# Patient Record
Sex: Female | Born: 1940 | Race: White | Hispanic: No | Marital: Married | Smoking: Never smoker
Health system: Southern US, Community
[De-identification: ages and names within clinical notes are randomized; demographics above are authoritative.]

## PROBLEM LIST (undated history)

## (undated) DIAGNOSIS — M1990 Unspecified osteoarthritis, unspecified site: Secondary | ICD-10-CM

## (undated) DIAGNOSIS — K219 Gastro-esophageal reflux disease without esophagitis: Secondary | ICD-10-CM

## (undated) DIAGNOSIS — R002 Palpitations: Secondary | ICD-10-CM

## (undated) DIAGNOSIS — I639 Cerebral infarction, unspecified: Secondary | ICD-10-CM

## (undated) DIAGNOSIS — I700 Atherosclerosis of aorta: Secondary | ICD-10-CM

## (undated) DIAGNOSIS — I209 Angina pectoris, unspecified: Secondary | ICD-10-CM

## (undated) DIAGNOSIS — G459 Transient cerebral ischemic attack, unspecified: Secondary | ICD-10-CM

## (undated) DIAGNOSIS — E785 Hyperlipidemia, unspecified: Secondary | ICD-10-CM

## (undated) DIAGNOSIS — K5792 Diverticulitis of intestine, part unspecified, without perforation or abscess without bleeding: Secondary | ICD-10-CM

## (undated) DIAGNOSIS — K589 Irritable bowel syndrome without diarrhea: Secondary | ICD-10-CM

## (undated) DIAGNOSIS — B029 Zoster without complications: Secondary | ICD-10-CM

## (undated) DIAGNOSIS — I10 Essential (primary) hypertension: Secondary | ICD-10-CM

## (undated) DIAGNOSIS — N952 Postmenopausal atrophic vaginitis: Secondary | ICD-10-CM

## (undated) DIAGNOSIS — I2510 Atherosclerotic heart disease of native coronary artery without angina pectoris: Secondary | ICD-10-CM

## (undated) DIAGNOSIS — M8580 Other specified disorders of bone density and structure, unspecified site: Secondary | ICD-10-CM

## (undated) DIAGNOSIS — M1612 Unilateral primary osteoarthritis, left hip: Secondary | ICD-10-CM

## (undated) HISTORY — PX: CARDIAC SURGERY: SHX584

## (undated) HISTORY — PX: ABDOMINAL HYSTERECTOMY: SHX81

## (undated) HISTORY — PX: APPENDECTOMY: SHX54

## (undated) HISTORY — PX: BREAST BIOPSY: SHX20

## (undated) HISTORY — PX: TONSILLECTOMY: SUR1361

---

## 2007-07-19 ENCOUNTER — Ambulatory Visit: Payer: Self-pay | Admitting: Ophthalmology

## 2007-10-10 ENCOUNTER — Ambulatory Visit: Payer: Self-pay | Admitting: Family Medicine

## 2007-12-18 ENCOUNTER — Ambulatory Visit: Payer: Self-pay | Admitting: Internal Medicine

## 2008-05-20 ENCOUNTER — Ambulatory Visit: Payer: Self-pay | Admitting: Family Medicine

## 2008-09-23 ENCOUNTER — Ambulatory Visit: Payer: Self-pay | Admitting: Family Medicine

## 2009-05-22 ENCOUNTER — Ambulatory Visit: Payer: Self-pay | Admitting: Internal Medicine

## 2009-11-23 ENCOUNTER — Ambulatory Visit: Payer: Self-pay | Admitting: Internal Medicine

## 2009-12-27 DIAGNOSIS — Z955 Presence of coronary angioplasty implant and graft: Secondary | ICD-10-CM | POA: Insufficient documentation

## 2010-01-05 ENCOUNTER — Inpatient Hospital Stay: Payer: Self-pay | Admitting: Cardiology

## 2010-02-05 ENCOUNTER — Encounter: Payer: Self-pay | Admitting: Cardiology

## 2010-02-24 ENCOUNTER — Encounter: Payer: Self-pay | Admitting: Cardiology

## 2010-03-27 ENCOUNTER — Encounter: Payer: Self-pay | Admitting: Cardiology

## 2010-06-08 ENCOUNTER — Ambulatory Visit: Payer: Self-pay | Admitting: Internal Medicine

## 2011-01-21 ENCOUNTER — Ambulatory Visit: Payer: Self-pay | Admitting: Internal Medicine

## 2011-07-14 ENCOUNTER — Ambulatory Visit: Payer: Self-pay | Admitting: Internal Medicine

## 2012-07-14 ENCOUNTER — Ambulatory Visit: Payer: Self-pay | Admitting: Internal Medicine

## 2012-09-23 ENCOUNTER — Ambulatory Visit: Payer: Self-pay | Admitting: Internal Medicine

## 2012-09-23 LAB — URINALYSIS, COMPLETE
Bilirubin,UR: NEGATIVE
Glucose,UR: NEGATIVE mg/dL (ref 0–75)
Nitrite: NEGATIVE
Specific Gravity: 1.01 (ref 1.003–1.030)

## 2012-09-25 LAB — URINE CULTURE

## 2013-07-25 ENCOUNTER — Ambulatory Visit: Payer: Self-pay

## 2013-10-27 ENCOUNTER — Emergency Department: Payer: Self-pay | Admitting: Emergency Medicine

## 2013-10-27 LAB — URINALYSIS, COMPLETE
Bacteria: NONE SEEN
Bilirubin,UR: NEGATIVE
Blood: NEGATIVE
Glucose,UR: NEGATIVE mg/dL (ref 0–75)
Ketone: NEGATIVE
Leukocyte Esterase: NEGATIVE
Ph: 7 (ref 4.5–8.0)

## 2013-10-27 LAB — COMPREHENSIVE METABOLIC PANEL
Albumin: 3.8 g/dL (ref 3.4–5.0)
Alkaline Phosphatase: 79 U/L (ref 50–136)
Anion Gap: 7 (ref 7–16)
BUN: 15 mg/dL (ref 7–18)
Calcium, Total: 9.4 mg/dL (ref 8.5–10.1)
Co2: 26 mmol/L (ref 21–32)
EGFR (African American): >60
Glucose: 102 mg/dL — ABNORMAL HIGH (ref 65–99)
Potassium: 4.1 mmol/L (ref 3.5–5.1)
SGPT (ALT): 26 U/L (ref 12–78)
Sodium: 141 mmol/L (ref 136–145)
Total Protein: 7.1 g/dL (ref 6.4–8.2)

## 2013-10-27 LAB — CK TOTAL AND CKMB (NOT AT ARMC)
CK, Total: 127 U/L (ref 21–215)
CK-MB: 1.6 ng/mL (ref 0.5–3.6)

## 2013-10-27 LAB — CBC
HGB: 14.9 g/dL (ref 12.0–16.0)
MCH: 31.9 pg (ref 26.0–34.0)
MCHC: 33.4 g/dL (ref 32.0–36.0)
Platelet: 213 10*3/uL (ref 150–440)
RBC: 4.66 10*6/uL (ref 3.80–5.20)

## 2013-10-27 LAB — LIPASE, BLOOD: Lipase: 133 U/L (ref 73–393)

## 2014-01-10 DIAGNOSIS — E7800 Pure hypercholesterolemia, unspecified: Secondary | ICD-10-CM | POA: Diagnosis not present

## 2014-01-10 DIAGNOSIS — Z0000 Encounter for general adult medical examination without abnormal findings: Secondary | ICD-10-CM | POA: Diagnosis not present

## 2014-01-10 DIAGNOSIS — Z79899 Other long term (current) drug therapy: Secondary | ICD-10-CM | POA: Diagnosis not present

## 2014-01-17 DIAGNOSIS — I2510 Atherosclerotic heart disease of native coronary artery without angina pectoris: Secondary | ICD-10-CM | POA: Diagnosis not present

## 2014-01-17 DIAGNOSIS — E039 Hypothyroidism, unspecified: Secondary | ICD-10-CM | POA: Diagnosis not present

## 2014-01-17 DIAGNOSIS — E785 Hyperlipidemia, unspecified: Secondary | ICD-10-CM | POA: Diagnosis not present

## 2014-01-17 DIAGNOSIS — M549 Dorsalgia, unspecified: Secondary | ICD-10-CM | POA: Diagnosis not present

## 2014-01-17 DIAGNOSIS — M47817 Spondylosis without myelopathy or radiculopathy, lumbosacral region: Secondary | ICD-10-CM | POA: Diagnosis not present

## 2014-01-24 DIAGNOSIS — K219 Gastro-esophageal reflux disease without esophagitis: Secondary | ICD-10-CM | POA: Diagnosis not present

## 2014-01-24 DIAGNOSIS — Z1211 Encounter for screening for malignant neoplasm of colon: Secondary | ICD-10-CM | POA: Diagnosis not present

## 2014-01-24 DIAGNOSIS — Z8371 Family history of colonic polyps: Secondary | ICD-10-CM | POA: Diagnosis not present

## 2014-01-28 DIAGNOSIS — E8941 Symptomatic postprocedural ovarian failure: Secondary | ICD-10-CM | POA: Diagnosis not present

## 2014-01-31 DIAGNOSIS — R293 Abnormal posture: Secondary | ICD-10-CM | POA: Diagnosis not present

## 2014-01-31 DIAGNOSIS — M545 Low back pain, unspecified: Secondary | ICD-10-CM | POA: Diagnosis not present

## 2014-01-31 DIAGNOSIS — IMO0001 Reserved for inherently not codable concepts without codable children: Secondary | ICD-10-CM | POA: Diagnosis not present

## 2014-02-22 DIAGNOSIS — J323 Chronic sphenoidal sinusitis: Secondary | ICD-10-CM | POA: Diagnosis not present

## 2014-02-24 DIAGNOSIS — M545 Low back pain, unspecified: Secondary | ICD-10-CM | POA: Diagnosis not present

## 2014-02-24 DIAGNOSIS — R293 Abnormal posture: Secondary | ICD-10-CM | POA: Diagnosis not present

## 2014-02-24 DIAGNOSIS — IMO0001 Reserved for inherently not codable concepts without codable children: Secondary | ICD-10-CM | POA: Diagnosis not present

## 2014-03-12 DIAGNOSIS — I2510 Atherosclerotic heart disease of native coronary artery without angina pectoris: Secondary | ICD-10-CM | POA: Diagnosis not present

## 2014-03-12 DIAGNOSIS — R002 Palpitations: Secondary | ICD-10-CM | POA: Diagnosis not present

## 2014-03-12 DIAGNOSIS — E782 Mixed hyperlipidemia: Secondary | ICD-10-CM | POA: Diagnosis not present

## 2014-03-12 DIAGNOSIS — I10 Essential (primary) hypertension: Secondary | ICD-10-CM | POA: Diagnosis not present

## 2014-03-28 ENCOUNTER — Ambulatory Visit: Payer: Self-pay | Admitting: Unknown Physician Specialty

## 2014-03-28 DIAGNOSIS — K449 Diaphragmatic hernia without obstruction or gangrene: Secondary | ICD-10-CM | POA: Diagnosis not present

## 2014-03-28 DIAGNOSIS — Z538 Procedure and treatment not carried out for other reasons: Secondary | ICD-10-CM | POA: Diagnosis not present

## 2014-03-28 DIAGNOSIS — E7800 Pure hypercholesterolemia, unspecified: Secondary | ICD-10-CM | POA: Diagnosis not present

## 2014-03-28 DIAGNOSIS — Z1211 Encounter for screening for malignant neoplasm of colon: Secondary | ICD-10-CM | POA: Diagnosis not present

## 2014-03-28 DIAGNOSIS — J329 Chronic sinusitis, unspecified: Secondary | ICD-10-CM | POA: Diagnosis not present

## 2014-03-28 DIAGNOSIS — Z888 Allergy status to other drugs, medicaments and biological substances status: Secondary | ICD-10-CM | POA: Diagnosis not present

## 2014-03-28 DIAGNOSIS — Z79899 Other long term (current) drug therapy: Secondary | ICD-10-CM | POA: Diagnosis not present

## 2014-03-28 DIAGNOSIS — I10 Essential (primary) hypertension: Secondary | ICD-10-CM | POA: Diagnosis not present

## 2014-03-28 DIAGNOSIS — K5730 Diverticulosis of large intestine without perforation or abscess without bleeding: Secondary | ICD-10-CM | POA: Diagnosis not present

## 2014-03-28 DIAGNOSIS — M899 Disorder of bone, unspecified: Secondary | ICD-10-CM | POA: Diagnosis not present

## 2014-03-28 DIAGNOSIS — Z8489 Family history of other specified conditions: Secondary | ICD-10-CM | POA: Diagnosis not present

## 2014-03-28 DIAGNOSIS — Z7982 Long term (current) use of aspirin: Secondary | ICD-10-CM | POA: Diagnosis not present

## 2014-03-28 DIAGNOSIS — Z9861 Coronary angioplasty status: Secondary | ICD-10-CM | POA: Diagnosis not present

## 2014-03-28 DIAGNOSIS — R002 Palpitations: Secondary | ICD-10-CM | POA: Diagnosis not present

## 2014-03-28 DIAGNOSIS — Z823 Family history of stroke: Secondary | ICD-10-CM | POA: Diagnosis not present

## 2014-03-28 DIAGNOSIS — Z5309 Procedure and treatment not carried out because of other contraindication: Secondary | ICD-10-CM | POA: Diagnosis not present

## 2014-03-28 DIAGNOSIS — I2510 Atherosclerotic heart disease of native coronary artery without angina pectoris: Secondary | ICD-10-CM | POA: Diagnosis not present

## 2014-03-28 DIAGNOSIS — R12 Heartburn: Secondary | ICD-10-CM | POA: Diagnosis not present

## 2014-03-28 DIAGNOSIS — Z8371 Family history of colonic polyps: Secondary | ICD-10-CM | POA: Diagnosis not present

## 2014-03-28 DIAGNOSIS — IMO0002 Reserved for concepts with insufficient information to code with codable children: Secondary | ICD-10-CM | POA: Diagnosis not present

## 2014-03-28 DIAGNOSIS — M949 Disorder of cartilage, unspecified: Secondary | ICD-10-CM | POA: Diagnosis not present

## 2014-03-28 DIAGNOSIS — N952 Postmenopausal atrophic vaginitis: Secondary | ICD-10-CM | POA: Diagnosis not present

## 2014-03-28 DIAGNOSIS — Z8249 Family history of ischemic heart disease and other diseases of the circulatory system: Secondary | ICD-10-CM | POA: Diagnosis not present

## 2014-03-28 DIAGNOSIS — E785 Hyperlipidemia, unspecified: Secondary | ICD-10-CM | POA: Diagnosis not present

## 2014-03-28 DIAGNOSIS — K648 Other hemorrhoids: Secondary | ICD-10-CM | POA: Diagnosis not present

## 2014-04-09 DIAGNOSIS — R5383 Other fatigue: Secondary | ICD-10-CM | POA: Diagnosis not present

## 2014-04-09 DIAGNOSIS — Z79899 Other long term (current) drug therapy: Secondary | ICD-10-CM | POA: Diagnosis not present

## 2014-04-09 DIAGNOSIS — R5381 Other malaise: Secondary | ICD-10-CM | POA: Diagnosis not present

## 2014-04-09 DIAGNOSIS — R4789 Other speech disturbances: Secondary | ICD-10-CM | POA: Diagnosis not present

## 2014-04-09 DIAGNOSIS — I10 Essential (primary) hypertension: Secondary | ICD-10-CM | POA: Diagnosis not present

## 2014-04-09 DIAGNOSIS — I2510 Atherosclerotic heart disease of native coronary artery without angina pectoris: Secondary | ICD-10-CM | POA: Diagnosis not present

## 2014-04-09 DIAGNOSIS — R55 Syncope and collapse: Secondary | ICD-10-CM | POA: Diagnosis not present

## 2014-04-09 DIAGNOSIS — R42 Dizziness and giddiness: Secondary | ICD-10-CM | POA: Diagnosis not present

## 2014-04-09 DIAGNOSIS — E785 Hyperlipidemia, unspecified: Secondary | ICD-10-CM | POA: Diagnosis not present

## 2014-04-09 DIAGNOSIS — Z7982 Long term (current) use of aspirin: Secondary | ICD-10-CM | POA: Diagnosis not present

## 2014-04-16 DIAGNOSIS — I10 Essential (primary) hypertension: Secondary | ICD-10-CM | POA: Diagnosis not present

## 2014-04-16 DIAGNOSIS — R358 Other polyuria: Secondary | ICD-10-CM | POA: Diagnosis not present

## 2014-04-16 DIAGNOSIS — G459 Transient cerebral ischemic attack, unspecified: Secondary | ICD-10-CM | POA: Diagnosis not present

## 2014-04-16 DIAGNOSIS — R42 Dizziness and giddiness: Secondary | ICD-10-CM | POA: Diagnosis not present

## 2014-04-16 DIAGNOSIS — R3589 Other polyuria: Secondary | ICD-10-CM | POA: Diagnosis not present

## 2014-04-16 DIAGNOSIS — E7800 Pure hypercholesterolemia, unspecified: Secondary | ICD-10-CM | POA: Diagnosis not present

## 2014-04-18 DIAGNOSIS — G459 Transient cerebral ischemic attack, unspecified: Secondary | ICD-10-CM | POA: Diagnosis not present

## 2014-04-20 DIAGNOSIS — R4789 Other speech disturbances: Secondary | ICD-10-CM | POA: Diagnosis not present

## 2014-04-20 DIAGNOSIS — I10 Essential (primary) hypertension: Secondary | ICD-10-CM | POA: Diagnosis not present

## 2014-04-20 DIAGNOSIS — I2510 Atherosclerotic heart disease of native coronary artery without angina pectoris: Secondary | ICD-10-CM | POA: Diagnosis not present

## 2014-04-20 DIAGNOSIS — I63541 Cerebral infarction due to unspecified occlusion or stenosis of right cerebellar artery: Secondary | ICD-10-CM

## 2014-04-20 DIAGNOSIS — I6350 Cerebral infarction due to unspecified occlusion or stenosis of unspecified cerebral artery: Secondary | ICD-10-CM | POA: Diagnosis not present

## 2014-04-20 DIAGNOSIS — E785 Hyperlipidemia, unspecified: Secondary | ICD-10-CM | POA: Diagnosis not present

## 2014-04-20 DIAGNOSIS — R9431 Abnormal electrocardiogram [ECG] [EKG]: Secondary | ICD-10-CM | POA: Diagnosis not present

## 2014-04-20 DIAGNOSIS — G459 Transient cerebral ischemic attack, unspecified: Secondary | ICD-10-CM | POA: Diagnosis not present

## 2014-04-20 DIAGNOSIS — Z9861 Coronary angioplasty status: Secondary | ICD-10-CM | POA: Diagnosis not present

## 2014-04-20 DIAGNOSIS — Z7982 Long term (current) use of aspirin: Secondary | ICD-10-CM | POA: Diagnosis not present

## 2014-04-20 HISTORY — DX: Cerebral infarction due to unspecified occlusion or stenosis of right cerebellar artery: I63.541

## 2014-04-23 ENCOUNTER — Emergency Department: Payer: Self-pay | Admitting: Emergency Medicine

## 2014-04-23 DIAGNOSIS — R42 Dizziness and giddiness: Secondary | ICD-10-CM | POA: Diagnosis not present

## 2014-04-23 DIAGNOSIS — Z7902 Long term (current) use of antithrombotics/antiplatelets: Secondary | ICD-10-CM | POA: Diagnosis not present

## 2014-04-23 DIAGNOSIS — Z9079 Acquired absence of other genital organ(s): Secondary | ICD-10-CM | POA: Diagnosis not present

## 2014-04-23 DIAGNOSIS — R5381 Other malaise: Secondary | ICD-10-CM | POA: Diagnosis not present

## 2014-04-23 DIAGNOSIS — Z8673 Personal history of transient ischemic attack (TIA), and cerebral infarction without residual deficits: Secondary | ICD-10-CM | POA: Diagnosis not present

## 2014-04-23 DIAGNOSIS — Z95818 Presence of other cardiac implants and grafts: Secondary | ICD-10-CM | POA: Diagnosis not present

## 2014-04-23 DIAGNOSIS — R5383 Other fatigue: Secondary | ICD-10-CM | POA: Diagnosis not present

## 2014-04-23 DIAGNOSIS — G459 Transient cerebral ischemic attack, unspecified: Secondary | ICD-10-CM | POA: Diagnosis not present

## 2014-04-23 DIAGNOSIS — Z9889 Other specified postprocedural states: Secondary | ICD-10-CM | POA: Diagnosis not present

## 2014-04-23 LAB — TROPONIN I

## 2014-04-23 LAB — COMPREHENSIVE METABOLIC PANEL
ALK PHOS: 69 U/L
Albumin: 3.7 g/dL (ref 3.4–5.0)
Anion Gap: 6 — ABNORMAL LOW (ref 7–16)
BUN: 15 mg/dL (ref 7–18)
Bilirubin,Total: 0.4 mg/dL (ref 0.2–1.0)
CHLORIDE: 105 mmol/L (ref 98–107)
CREATININE: 1.02 mg/dL (ref 0.60–1.30)
Calcium, Total: 9.2 mg/dL (ref 8.5–10.1)
Co2: 28 mmol/L (ref 21–32)
EGFR (African American): >60
GFR CALC NON AF AMER: 54 — AB
GLUCOSE: 89 mg/dL (ref 65–99)
Osmolality: 278 (ref 275–301)
Potassium: 4 mmol/L (ref 3.5–5.1)
SGOT(AST): 22 U/L (ref 15–37)
SGPT (ALT): 26 U/L (ref 12–78)
SODIUM: 139 mmol/L (ref 136–145)
TOTAL PROTEIN: 7 g/dL (ref 6.4–8.2)

## 2014-04-23 LAB — URINALYSIS, COMPLETE
BLOOD: NEGATIVE
Bacteria: NONE SEEN
Bilirubin,UR: NEGATIVE
Glucose,UR: NEGATIVE mg/dL (ref 0–75)
KETONE: NEGATIVE
LEUKOCYTE ESTERASE: NEGATIVE
Nitrite: NEGATIVE
PH: 6 (ref 4.5–8.0)
PROTEIN: NEGATIVE
RBC,UR: <1 /HPF (ref 0–5)
SPECIFIC GRAVITY: 1.002 (ref 1.003–1.030)
Squamous Epithelial: 1

## 2014-04-23 LAB — CBC
HCT: 42.8 % (ref 35.0–47.0)
HGB: 14.3 g/dL (ref 12.0–16.0)
MCH: 32.4 pg (ref 26.0–34.0)
MCHC: 33.3 g/dL (ref 32.0–36.0)
MCV: 97 fL (ref 80–100)
PLATELETS: 195 10*3/uL (ref 150–440)
RBC: 4.4 10*6/uL (ref 3.80–5.20)
RDW: 14.4 % (ref 11.5–14.5)
WBC: 7.7 10*3/uL (ref 3.6–11.0)

## 2014-04-24 DIAGNOSIS — I6350 Cerebral infarction due to unspecified occlusion or stenosis of unspecified cerebral artery: Secondary | ICD-10-CM | POA: Diagnosis not present

## 2014-04-24 DIAGNOSIS — R42 Dizziness and giddiness: Secondary | ICD-10-CM | POA: Diagnosis not present

## 2014-06-03 DIAGNOSIS — G459 Transient cerebral ischemic attack, unspecified: Secondary | ICD-10-CM | POA: Insufficient documentation

## 2014-06-03 DIAGNOSIS — R002 Palpitations: Secondary | ICD-10-CM | POA: Insufficient documentation

## 2014-06-03 DIAGNOSIS — N952 Postmenopausal atrophic vaginitis: Secondary | ICD-10-CM | POA: Insufficient documentation

## 2014-06-03 DIAGNOSIS — I10 Essential (primary) hypertension: Secondary | ICD-10-CM | POA: Insufficient documentation

## 2014-06-03 DIAGNOSIS — R350 Frequency of micturition: Secondary | ICD-10-CM | POA: Diagnosis not present

## 2014-06-03 DIAGNOSIS — E785 Hyperlipidemia, unspecified: Secondary | ICD-10-CM | POA: Insufficient documentation

## 2014-06-03 HISTORY — DX: Transient cerebral ischemic attack, unspecified: G45.9

## 2014-06-05 DIAGNOSIS — H2510 Age-related nuclear cataract, unspecified eye: Secondary | ICD-10-CM | POA: Diagnosis not present

## 2014-06-17 ENCOUNTER — Ambulatory Visit: Payer: Self-pay | Admitting: Physician Assistant

## 2014-06-17 DIAGNOSIS — E7800 Pure hypercholesterolemia, unspecified: Secondary | ICD-10-CM | POA: Diagnosis not present

## 2014-06-17 DIAGNOSIS — Z7982 Long term (current) use of aspirin: Secondary | ICD-10-CM | POA: Diagnosis not present

## 2014-06-17 DIAGNOSIS — Z8673 Personal history of transient ischemic attack (TIA), and cerebral infarction without residual deficits: Secondary | ICD-10-CM | POA: Diagnosis not present

## 2014-06-17 DIAGNOSIS — N3090 Cystitis, unspecified without hematuria: Secondary | ICD-10-CM | POA: Diagnosis not present

## 2014-06-17 DIAGNOSIS — Z79899 Other long term (current) drug therapy: Secondary | ICD-10-CM | POA: Diagnosis not present

## 2014-06-17 DIAGNOSIS — N390 Urinary tract infection, site not specified: Secondary | ICD-10-CM | POA: Diagnosis not present

## 2014-06-17 LAB — URINALYSIS, COMPLETE
Bilirubin,UR: NEGATIVE
Glucose,UR: NEGATIVE mg/dL (ref 0–75)
KETONE: NEGATIVE
NITRITE: NEGATIVE
Ph: 6 (ref 4.5–8.0)
Specific Gravity: 1.025 (ref 1.003–1.030)
WBC UR: >30 /HPF (ref 0–5)

## 2014-06-19 LAB — URINE CULTURE

## 2014-08-08 DIAGNOSIS — Z0000 Encounter for general adult medical examination without abnormal findings: Secondary | ICD-10-CM | POA: Diagnosis not present

## 2014-08-08 DIAGNOSIS — I10 Essential (primary) hypertension: Secondary | ICD-10-CM | POA: Diagnosis not present

## 2014-08-08 DIAGNOSIS — E039 Hypothyroidism, unspecified: Secondary | ICD-10-CM | POA: Diagnosis not present

## 2014-08-16 DIAGNOSIS — I10 Essential (primary) hypertension: Secondary | ICD-10-CM | POA: Diagnosis not present

## 2014-08-16 DIAGNOSIS — E785 Hyperlipidemia, unspecified: Secondary | ICD-10-CM | POA: Diagnosis not present

## 2014-08-16 DIAGNOSIS — Z0000 Encounter for general adult medical examination without abnormal findings: Secondary | ICD-10-CM | POA: Diagnosis not present

## 2014-08-16 DIAGNOSIS — R002 Palpitations: Secondary | ICD-10-CM | POA: Diagnosis not present

## 2014-08-16 DIAGNOSIS — G459 Transient cerebral ischemic attack, unspecified: Secondary | ICD-10-CM | POA: Diagnosis not present

## 2014-08-16 DIAGNOSIS — Z1239 Encounter for other screening for malignant neoplasm of breast: Secondary | ICD-10-CM | POA: Diagnosis not present

## 2014-08-19 DIAGNOSIS — Z23 Encounter for immunization: Secondary | ICD-10-CM | POA: Diagnosis not present

## 2014-08-29 DIAGNOSIS — Z23 Encounter for immunization: Secondary | ICD-10-CM | POA: Diagnosis not present

## 2014-08-30 DIAGNOSIS — D1801 Hemangioma of skin and subcutaneous tissue: Secondary | ICD-10-CM | POA: Diagnosis not present

## 2014-08-30 DIAGNOSIS — L819 Disorder of pigmentation, unspecified: Secondary | ICD-10-CM | POA: Diagnosis not present

## 2014-08-30 DIAGNOSIS — L821 Other seborrheic keratosis: Secondary | ICD-10-CM | POA: Diagnosis not present

## 2014-09-04 ENCOUNTER — Ambulatory Visit: Payer: Self-pay

## 2014-09-04 DIAGNOSIS — Z1231 Encounter for screening mammogram for malignant neoplasm of breast: Secondary | ICD-10-CM | POA: Diagnosis not present

## 2014-09-11 ENCOUNTER — Other Ambulatory Visit: Payer: Self-pay | Admitting: Unknown Physician Specialty

## 2014-09-11 DIAGNOSIS — K5730 Diverticulosis of large intestine without perforation or abscess without bleeding: Secondary | ICD-10-CM

## 2014-09-17 DIAGNOSIS — E785 Hyperlipidemia, unspecified: Secondary | ICD-10-CM | POA: Diagnosis not present

## 2014-09-17 DIAGNOSIS — I2510 Atherosclerotic heart disease of native coronary artery without angina pectoris: Secondary | ICD-10-CM | POA: Diagnosis not present

## 2014-09-17 DIAGNOSIS — I6350 Cerebral infarction due to unspecified occlusion or stenosis of unspecified cerebral artery: Secondary | ICD-10-CM | POA: Diagnosis not present

## 2014-09-17 DIAGNOSIS — I10 Essential (primary) hypertension: Secondary | ICD-10-CM | POA: Diagnosis not present

## 2014-09-24 DIAGNOSIS — H2510 Age-related nuclear cataract, unspecified eye: Secondary | ICD-10-CM | POA: Diagnosis not present

## 2015-02-13 DIAGNOSIS — R002 Palpitations: Secondary | ICD-10-CM | POA: Diagnosis not present

## 2015-02-13 DIAGNOSIS — G459 Transient cerebral ischemic attack, unspecified: Secondary | ICD-10-CM | POA: Diagnosis not present

## 2015-02-13 DIAGNOSIS — Z0000 Encounter for general adult medical examination without abnormal findings: Secondary | ICD-10-CM | POA: Diagnosis not present

## 2015-02-13 DIAGNOSIS — I10 Essential (primary) hypertension: Secondary | ICD-10-CM | POA: Diagnosis not present

## 2015-02-26 DIAGNOSIS — E780 Pure hypercholesterolemia: Secondary | ICD-10-CM | POA: Diagnosis not present

## 2015-02-26 DIAGNOSIS — I2510 Atherosclerotic heart disease of native coronary artery without angina pectoris: Secondary | ICD-10-CM | POA: Diagnosis not present

## 2015-02-26 DIAGNOSIS — I10 Essential (primary) hypertension: Secondary | ICD-10-CM | POA: Diagnosis not present

## 2015-02-26 DIAGNOSIS — I639 Cerebral infarction, unspecified: Secondary | ICD-10-CM | POA: Diagnosis not present

## 2015-03-25 DIAGNOSIS — I639 Cerebral infarction, unspecified: Secondary | ICD-10-CM | POA: Diagnosis not present

## 2015-03-25 DIAGNOSIS — I2510 Atherosclerotic heart disease of native coronary artery without angina pectoris: Secondary | ICD-10-CM | POA: Diagnosis not present

## 2015-03-25 DIAGNOSIS — I10 Essential (primary) hypertension: Secondary | ICD-10-CM | POA: Diagnosis not present

## 2015-03-25 DIAGNOSIS — E782 Mixed hyperlipidemia: Secondary | ICD-10-CM | POA: Diagnosis not present

## 2015-04-23 ENCOUNTER — Ambulatory Visit
Admission: RE | Admit: 2015-04-23 | Discharge: 2015-04-23 | Disposition: A | Discharge status: Home / Self Care | Payer: Medicare Other | Source: Ambulatory Visit | Attending: Unknown Physician Specialty | Admitting: Unknown Physician Specialty

## 2015-04-23 DIAGNOSIS — K5730 Diverticulosis of large intestine without perforation or abscess without bleeding: Secondary | ICD-10-CM | POA: Diagnosis not present

## 2015-04-23 DIAGNOSIS — Z8719 Personal history of other diseases of the digestive system: Secondary | ICD-10-CM | POA: Diagnosis not present

## 2015-06-03 DIAGNOSIS — R0683 Snoring: Secondary | ICD-10-CM | POA: Diagnosis not present

## 2015-06-03 DIAGNOSIS — G4733 Obstructive sleep apnea (adult) (pediatric): Secondary | ICD-10-CM | POA: Diagnosis not present

## 2015-06-03 DIAGNOSIS — Z8673 Personal history of transient ischemic attack (TIA), and cerebral infarction without residual deficits: Secondary | ICD-10-CM | POA: Diagnosis not present

## 2015-07-04 DIAGNOSIS — M170 Bilateral primary osteoarthritis of knee: Secondary | ICD-10-CM | POA: Diagnosis not present

## 2015-08-26 DIAGNOSIS — I2510 Atherosclerotic heart disease of native coronary artery without angina pectoris: Secondary | ICD-10-CM | POA: Diagnosis not present

## 2015-08-26 DIAGNOSIS — I10 Essential (primary) hypertension: Secondary | ICD-10-CM | POA: Diagnosis not present

## 2015-08-26 DIAGNOSIS — E780 Pure hypercholesterolemia: Secondary | ICD-10-CM | POA: Diagnosis not present

## 2015-08-29 DIAGNOSIS — L821 Other seborrheic keratosis: Secondary | ICD-10-CM | POA: Diagnosis not present

## 2015-09-03 ENCOUNTER — Other Ambulatory Visit: Payer: Self-pay | Admitting: Infectious Diseases

## 2015-09-03 DIAGNOSIS — I2510 Atherosclerotic heart disease of native coronary artery without angina pectoris: Secondary | ICD-10-CM | POA: Diagnosis not present

## 2015-09-03 DIAGNOSIS — E780 Pure hypercholesterolemia: Secondary | ICD-10-CM | POA: Diagnosis not present

## 2015-09-03 DIAGNOSIS — I639 Cerebral infarction, unspecified: Secondary | ICD-10-CM | POA: Diagnosis not present

## 2015-09-03 DIAGNOSIS — Z1231 Encounter for screening mammogram for malignant neoplasm of breast: Secondary | ICD-10-CM

## 2015-09-03 DIAGNOSIS — M170 Bilateral primary osteoarthritis of knee: Secondary | ICD-10-CM | POA: Diagnosis not present

## 2015-09-09 DIAGNOSIS — Z23 Encounter for immunization: Secondary | ICD-10-CM | POA: Diagnosis not present

## 2015-09-10 ENCOUNTER — Ambulatory Visit
Admission: RE | Admit: 2015-09-10 | Discharge: 2015-09-10 | Disposition: A | Discharge status: Home / Self Care | Payer: Medicare Other | Source: Ambulatory Visit | Attending: Infectious Diseases | Admitting: Infectious Diseases

## 2015-09-10 DIAGNOSIS — Z1231 Encounter for screening mammogram for malignant neoplasm of breast: Secondary | ICD-10-CM | POA: Diagnosis not present

## 2015-09-25 DIAGNOSIS — I2510 Atherosclerotic heart disease of native coronary artery without angina pectoris: Secondary | ICD-10-CM | POA: Diagnosis not present

## 2015-09-25 DIAGNOSIS — I639 Cerebral infarction, unspecified: Secondary | ICD-10-CM | POA: Diagnosis not present

## 2015-09-25 DIAGNOSIS — R002 Palpitations: Secondary | ICD-10-CM | POA: Diagnosis not present

## 2015-09-25 DIAGNOSIS — E782 Mixed hyperlipidemia: Secondary | ICD-10-CM | POA: Diagnosis not present

## 2015-09-25 DIAGNOSIS — I10 Essential (primary) hypertension: Secondary | ICD-10-CM | POA: Diagnosis not present

## 2015-09-30 DIAGNOSIS — H2513 Age-related nuclear cataract, bilateral: Secondary | ICD-10-CM | POA: Diagnosis not present

## 2015-11-07 ENCOUNTER — Ambulatory Visit
Admission: EM | Admit: 2015-11-07 | Discharge: 2015-11-07 | Disposition: A | Discharge status: Home / Self Care | Payer: Medicare Other | Attending: Registered Nurse | Admitting: Registered Nurse

## 2015-11-07 ENCOUNTER — Encounter: Payer: Self-pay | Admitting: Emergency Medicine

## 2015-11-07 DIAGNOSIS — H6593 Unspecified nonsuppurative otitis media, bilateral: Secondary | ICD-10-CM

## 2015-11-07 DIAGNOSIS — J0100 Acute maxillary sinusitis, unspecified: Secondary | ICD-10-CM

## 2015-11-07 HISTORY — DX: Atherosclerotic heart disease of native coronary artery without angina pectoris: I25.10

## 2015-11-07 HISTORY — DX: Cerebral infarction, unspecified: I63.9

## 2015-11-07 MED ORDER — DOXYCYCLINE HYCLATE 100 MG PO CAPS: 100.0000 mg | ORAL_CAPSULE | Freq: Two times a day (BID) | ORAL | Status: DC

## 2015-11-07 MED ORDER — SALINE SPRAY 0.65 % NA SOLN: 2.0000 | NASAL | Status: DC

## 2015-11-07 MED ORDER — ACETAMINOPHEN 500 MG PO TABS: 500.0000 mg | ORAL_TABLET | Freq: Four times a day (QID) | ORAL | Status: DC | PRN

## 2015-11-07 NOTE — ED Provider Notes (Signed)
CSN: [id number]     Arrival date & time 11/07/15  1139 History   None    Chief Complaint  Patient presents with  . Facial Pain  . 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  Treated with doxycycline and no further reoccurance until this past week.  Teeth throbbing, headache forehead, eye pressure, ear pressure right greater than left, nasal congestion right.  Has been taking flonase 2 sprays each nostril daily.  Had a nosebleed earlier this week not using saline or tylenol.  Has tried claritin and benadryl but gives her med head and still having pressure.  Doesn't like to shower before bedtime.  The history is provided by the patient.    Past Medical History  Diagnosis Date  . Stroke ([facility])   . Coronary artery disease    Past Surgical History  Procedure Laterality Date  . Breast biopsy Left 1990's    lt bx x 2-neg  . Abdominal hysterectomy    . Tonsillectomy    . Cardiac surgery     Family History  Problem Relation Age of Onset  . Breast cancer Cousin 60   Social History  Substance Use Topics  . Smoking status: Never Smoker   . Smokeless tobacco: None  . Alcohol Use: Yes   OB History    No data available     Review of Systems  Constitutional: Negative for fever, chills, diaphoresis, activity change, appetite change, fatigue and unexpected weight change.  HENT: Positive for congestion, ear pain, nosebleeds, postnasal drip and sinus pressure. Negative for dental problem, drooling, ear discharge, facial swelling, hearing loss, mouth sores, rhinorrhea, sneezing, sore throat, tinnitus, trouble swallowing and voice change.   Eyes: Negative for photophobia,  pain, discharge, redness, itching and visual disturbance.  Respiratory: Negative for cough, choking, chest tightness, shortness of breath, wheezing and stridor.   Cardiovascular: Negative for chest pain, palpitations and leg swelling.  Gastrointestinal: Negative for nausea, vomiting, abdominal pain, diarrhea, constipation, blood in stool and abdominal distention.  Endocrine: Negative for cold intolerance and heat intolerance.  Genitourinary: Negative for dysuria, hematuria and difficulty urinating.  Musculoskeletal: Negative for myalgias, back pain, joint swelling, arthralgias, gait problem, neck pain and neck stiffness.  Skin: Negative for color change, pallor, rash and wound.  Allergic/Immunologic: Positive for environmental allergies. Negative for food allergies.  Neurological: Positive for headaches. Negative for dizziness, tremors, seizures, syncope, facial asymmetry, speech difficulty, weakness, light-headedness and numbness.  Hematological: Negative for adenopathy. Does not bruise/bleed easily.  Psychiatric/Behavioral: Negative for behavioral problems, confusion, sleep disturbance and agitation.    Allergies  Amoxicillin and Septra  Home Medications   Prior to Admission medications   Medication Sig Start Date End Date Taking? Authorizing Provider  aspirin 81 MG tablet Take 81 mg by mouth daily.   Yes Historical Provider, MD  cholecalciferol (VITAMIN D) 1000 UNITS tablet Take 1,000 Units by mouth daily.   Yes Historical Provider, MD  Fenofibrate 40 MG TABS Take 80 mg by mouth daily.   Yes Historical Provider, MD  fluticasone (FLONASE) 50 MCG/ACT nasal spray Place 1 spray into both nostrils daily.   Yes Historical Provider, MD  acetaminophen (TYLENOL) 500 MG tablet Take 1 tablet (500 mg total) by mouth every 6 (six) hours as  needed. 11/07/15   Olen Cordial, NP  doxycycline (VIBRAMYCIN) 100 MG capsule Take 1 capsule (100 mg total) by mouth 2 (two) times daily. 11/07/15   Olen Cordial, NP  sodium chloride (OCEAN) 0.65 % SOLN nasal spray Place 2 sprays into both nostrils every 2 (two) hours while awake. 11/07/15   Olen Cordial, NP   Meds Ordered and Administered this Visit  Medications - No data to display  BP 131/77 mmHg  Pulse 83  Temp(Src) 96.8 F (36 C) (Tympanic)  Resp 16  Ht 5\' 1"  (1.549 m)  Wt 148 lb (67.132 kg)  BMI 27.98 kg/m2  SpO2 97% No data found.   Physical Exam  Constitutional: She is oriented to person, place, and time. She appears well-developed and well-nourished. She is active and cooperative.  Non-toxic appearance. She does not have a sickly appearance. She appears ill. No distress.  HENT:  Head: Normocephalic and atraumatic.  Right Ear: Hearing, external ear and ear canal normal. A middle ear effusion is present.  Left Ear: Hearing, external ear and ear canal normal. A middle ear effusion is present.  Nose: Mucosal edema and rhinorrhea present. No nose lacerations, sinus tenderness, nasal deformity, septal deviation or nasal septal hematoma. No epistaxis.  No foreign bodies. Right sinus exhibits maxillary sinus tenderness and frontal sinus tenderness. Left sinus exhibits no maxillary sinus tenderness and no frontal sinus tenderness.  Mouth/Throat: Uvula is midline and mucous membranes are normal. Mucous membranes are not pale, not dry and not cyanotic. She does not have dentures. No oral lesions. No trismus in the jaw. Normal dentition. No dental abscesses, uvula swelling, lacerations or dental caries. Posterior oropharyngeal edema and posterior oropharyngeal erythema present. No oropharyngeal exudate or tonsillar abscesses.  Cobblestoning posterior pharynx; right maxillary greater than frontal tenderness; bilateral TMs with air fluid level slight opacity 6 oclock; dry flaking skin bilateral external auditory canals; bilateral nasal turbinates with edema/erythema  Eyes: Conjunctivae, EOM and lids are normal. Pupils are equal, round,  and reactive to light. Right eye exhibits no chemosis, no discharge, no exudate and no hordeolum. No foreign body present in the right eye. Left eye exhibits no chemosis, no discharge, no exudate and no hordeolum. No foreign body present in the left eye. Right conjunctiva is not injected. Right conjunctiva has no hemorrhage. Left conjunctiva is not injected. Left conjunctiva has no hemorrhage. No scleral icterus. Right eye exhibits normal extraocular motion and no nystagmus. Left eye exhibits normal extraocular motion and no nystagmus. Right pupil is round and reactive. Left pupil is round and reactive. Pupils are equal.  Neck: Trachea normal and normal range of motion. Neck supple. No tracheal tenderness, no spinous process tenderness and no muscular tenderness present. No rigidity. No tracheal deviation, no edema, no erythema and normal range of motion present. No thyroid mass and no thyromegaly present.  Cardiovascular: Normal rate, regular rhythm, S1 normal, S2 normal, normal heart sounds and intact distal pulses.  PMI is not displaced.  Exam reveals no gallop and no friction rub.   No murmur heard. Pulmonary/Chest: Effort normal and breath sounds normal. No accessory muscle usage or stridor. No respiratory distress. She has no decreased breath sounds. She has no wheezes. She has no rhonchi. She has no rales. She exhibits no tenderness.  Abdominal: Soft. She exhibits no distension.  Musculoskeletal: Normal range of motion. She exhibits no edema or tenderness.       Right shoulder: Normal.       Left shoulder:  Normal.       Right hip: Normal.       Left hip: Normal.       Right knee: Normal.       Left knee: Normal.       Cervical back: Normal.       Right hand: Normal.       Left hand: Normal.  Lymphadenopathy:       Head (right side): No submental, no submandibular, no tonsillar, no preauricular, no posterior auricular and no occipital adenopathy present.       Head (left side): No submental,  no submandibular, no tonsillar, no preauricular, no posterior auricular and no occipital adenopathy present.    She has no cervical adenopathy.       Right cervical: No superficial cervical, no deep cervical and no posterior cervical adenopathy present.      Left cervical: No superficial cervical, no deep cervical and no posterior cervical adenopathy present.  Neurological: She is alert and oriented to person, place, and time. She has normal strength. She is not disoriented. She displays no atrophy and no tremor. No cranial nerve deficit or sensory deficit. She exhibits normal muscle tone. She displays no seizure activity. Coordination and gait normal. GCS eye subscore is 4. GCS verbal subscore is 5. GCS motor subscore is 6.  Skin: Skin is warm, dry and intact. No abrasion, no bruising, no burn, no ecchymosis, no laceration, no lesion, no petechiae and no rash noted. She is not diaphoretic. No cyanosis or erythema. No pallor. Nails show no clubbing.  Psychiatric: She has a normal mood and affect. Her speech is normal and behavior is normal. Judgment and thought content normal. Cognition and memory are normal.  Nursing note and vitals reviewed.   ED Course  Procedures (including critical care time)  Labs Review Labs Reviewed - No data to display  Imaging Review No results found.   MDM   1. Acute maxillary sinusitis, recurrence not specified   2. Otitis media with effusion, bilateral    Allergic bactrim and penicillin doxycycline has worked well for her in the past Doxycycline 100mg  po BID x 10 days.  Tylenol 500mg  po q4h prn pain. Nasal saline 2 sprays each nostril q2h while awake prn congestion.  Continue flonase consider 1 spray each nostril BID.  Shower before bedtime and upon awakening to help blow out mucous and rinse off dust/pollen which could be contributing to congestion.  No evidence of systemic bacterial infection, non toxic and well hydrated.  I do not see where any further  testing or imaging is necessary at this time.   I will suggest supportive care, rest, good hygiene and encourage the patient to take adequate fluids.  The patient is to return to clinic or EMERGENCY ROOM if symptoms worsen or change significantly.  Exitcare handout on sinusitis given to patient.  Patient verbalized agreement and understanding of treatment plan and had no further questions at this time.   P2:  Hand washing and cover cough  Supportive treatment.   No evidence of invasive bacterial infection, non toxic and well hydrated.  This is most likely self limiting viral infection.  I do not see where any further testing or imaging is necessary at this time.   I will suggest supportive care, rest, good hygiene and encourage the patient to take adequate fluids.  The patient is to return to clinic or EMERGENCY ROOM if symptoms worsen or change significantly e.g. ear pain, fever, purulent discharge from ears  or bleeding.  Exitcare handout on otitis media with effusion given to patient.  Patient verbalized agreement and understanding of treatment plan.      Olen Cordial, NP 11/07/15 1344

## 2015-11-07 NOTE — Discharge Instructions (Signed)
Otitis Media With Effusion Otitis media with effusion is the presence of fluid in the middle ear. This is a common problem in children, which often follows ear infections. It may be present for weeks or longer after the infection. Unlike an acute ear infection, otitis media with effusion refers only to fluid behind the ear drum and not infection. Children with repeated ear and sinus infections and allergy problems are the most likely to get otitis media with effusion. CAUSES  The most frequent cause of the fluid buildup is dysfunction of the eustachian tubes. These are the tubes that drain fluid in the ears to the back of the nose (nasopharynx). SYMPTOMS   The main symptom of this condition is hearing loss. As a result, you or your child may:  Listen to the TV at a loud volume.  Not respond to questions.  Ask "what" often when spoken to.  Mistake or confuse one sound or word for another.  There may be a sensation of fullness or pressure but usually not pain. DIAGNOSIS   Your health care provider will diagnose this condition by examining you or your child's ears.  Your health care provider may test the pressure in you or your child's ear with a tympanometer.  A hearing test may be conducted if the problem persists. TREATMENT   Treatment depends on the duration and the effects of the effusion.  Antibiotics, decongestants, nose drops, and cortisone-type drugs (tablets or nasal spray) may not be helpful.  Children with persistent ear effusions may have delayed language or behavioral problems. Children at risk for developmental delays in hearing, learning, and speech may require referral to a specialist earlier than children not at risk.  You or your child's health care provider may suggest a referral to an ear, nose, and throat surgeon for treatment. The following may help restore normal hearing:  Drainage of fluid.  Placement of ear tubes (tympanostomy tubes).  Removal of adenoids  (adenoidectomy). HOME CARE INSTRUCTIONS   Avoid secondhand smoke.  Infants who are breastfed are less likely to have this condition.  Avoid feeding infants while they are lying flat.  Avoid known environmental allergens.  Avoid people who are sick. SEEK MEDICAL CARE IF:   Hearing is not better in 3 months.  Hearing is worse.  Ear pain.  Drainage from the ear.  Dizziness. MAKE SURE YOU:   Understand these instructions.  Will watch your condition.  Will get help right away if you are not doing well or get worse.   This information is not intended to replace advice given to you by your health care provider. Make sure you discuss any questions you have with your health care provider.   Document Released: 01/20/2005 Document Revised: 01/03/2015 Document Reviewed: 07/10/2013 Elsevier Interactive Patient Education 2016 [employer] Sinusitis, Adult Sinusitis is redness, soreness, and inflammation of the paranasal sinuses. Paranasal sinuses are air pockets within the bones of your face. They are located beneath your eyes, in the middle of your forehead, and above your eyes. In healthy paranasal sinuses, mucus is able to drain out, and air is able to circulate through them by way of your nose. However, when your paranasal sinuses are inflamed, mucus and air can become trapped. This can allow bacteria and other germs to grow and cause infection. Sinusitis can develop quickly and last only a short time (acute) or continue over a long period (chronic). Sinusitis that lasts for more than 12 weeks is considered chronic. CAUSES Causes of sinusitis   include:  Allergies.  Structural abnormalities, such as displacement of the cartilage that separates your nostrils (deviated septum), which can decrease the air flow through your nose and sinuses and affect sinus drainage.  Functional abnormalities, such as when the small hairs (cilia) that line your sinuses and help remove mucus do not work  properly or are not present. SIGNS AND SYMPTOMS Symptoms of acute and chronic sinusitis are the same. The primary symptoms are pain and pressure around the affected sinuses. Other symptoms include:  Upper toothache.  Earache.  Headache.  Bad breath.  Decreased sense of smell and taste.  A cough, which worsens when you are lying flat.  Fatigue.  Fever.  Thick drainage from your nose, which often is green and may contain pus (purulent).  Swelling and warmth over the affected sinuses. DIAGNOSIS Your health care provider will perform a physical exam. During your exam, your health care provider may perform any of the following to help determine if you have acute sinusitis or chronic sinusitis:  Look in your nose for signs of abnormal growths in your nostrils (nasal polyps).  Tap over the affected sinus to check for signs of infection.  View the inside of your sinuses using an imaging device that has a light attached (endoscope). If your health care provider suspects that you have chronic sinusitis, one or more of the following tests may be recommended:  Allergy tests.  Nasal culture. A sample of mucus is taken from your nose, sent to a lab, and screened for bacteria.  Nasal cytology. A sample of mucus is taken from your nose and examined by your health care provider to determine if your sinusitis is related to an allergy. TREATMENT Most cases of acute sinusitis are related to a viral infection and will resolve on their own within 10 days. Sometimes, medicines are prescribed to help relieve symptoms of both acute and chronic sinusitis. These may include pain medicines, decongestants, nasal steroid sprays, or saline sprays. However, for sinusitis related to a bacterial infection, your health care provider will prescribe antibiotic medicines. These are medicines that will help kill the bacteria causing the infection. Rarely, sinusitis is caused by a fungal infection. In these cases,  your health care provider will prescribe antifungal medicine. For some cases of chronic sinusitis, surgery is needed. Generally, these are cases in which sinusitis recurs more than 3 times per year, despite other treatments. HOME CARE INSTRUCTIONS  Drink plenty of water. Water helps thin the mucus so your sinuses can drain more easily.  Use a humidifier.  Inhale steam 3-4 times a day (for example, sit in the bathroom with the shower running).  Apply a warm, moist washcloth to your face 3-4 times a day, or as directed by your health care provider.  Use saline nasal sprays to help moisten and clean your sinuses.  Take medicines only as directed by your health care provider.  If you were prescribed either an antibiotic or antifungal medicine, finish it all even if you start to feel better. SEEK IMMEDIATE MEDICAL CARE IF:  You have increasing pain or severe headaches.  You have nausea, vomiting, or drowsiness.  You have swelling around your face.  You have vision problems.  You have a stiff neck.  You have difficulty breathing.   This information is not intended to replace advice given to you by your health care provider. Make sure you discuss any questions you have with your health care provider.   Document Released: 12/13/2005 Document Revised: 01/03/2015   Document Reviewed: 12/28/2011 Elsevier Interactive Patient Education 2016 [employer]  

## 2015-11-07 NOTE — ED Notes (Signed)
Patient c/o sinus pain and pressure and nasal congestion for a week.

## 2016-01-15 DIAGNOSIS — E782 Mixed hyperlipidemia: Secondary | ICD-10-CM | POA: Diagnosis not present

## 2016-01-15 DIAGNOSIS — R0602 Shortness of breath: Secondary | ICD-10-CM | POA: Diagnosis not present

## 2016-01-15 DIAGNOSIS — I2510 Atherosclerotic heart disease of native coronary artery without angina pectoris: Secondary | ICD-10-CM | POA: Diagnosis not present

## 2016-01-15 DIAGNOSIS — R002 Palpitations: Secondary | ICD-10-CM | POA: Diagnosis not present

## 2016-01-15 DIAGNOSIS — R1084 Generalized abdominal pain: Secondary | ICD-10-CM | POA: Diagnosis not present

## 2016-01-15 DIAGNOSIS — I10 Essential (primary) hypertension: Secondary | ICD-10-CM | POA: Diagnosis not present

## 2016-01-15 DIAGNOSIS — I639 Cerebral infarction, unspecified: Secondary | ICD-10-CM | POA: Diagnosis not present

## 2016-01-23 DIAGNOSIS — R0602 Shortness of breath: Secondary | ICD-10-CM | POA: Diagnosis not present

## 2016-01-26 DIAGNOSIS — R002 Palpitations: Secondary | ICD-10-CM | POA: Diagnosis not present

## 2016-01-30 DIAGNOSIS — E782 Mixed hyperlipidemia: Secondary | ICD-10-CM | POA: Diagnosis not present

## 2016-01-30 DIAGNOSIS — I10 Essential (primary) hypertension: Secondary | ICD-10-CM | POA: Diagnosis not present

## 2016-01-30 DIAGNOSIS — I2510 Atherosclerotic heart disease of native coronary artery without angina pectoris: Secondary | ICD-10-CM | POA: Diagnosis not present

## 2016-02-07 ENCOUNTER — Ambulatory Visit
Admission: EM | Admit: 2016-02-07 | Discharge: 2016-02-07 | Disposition: A | Discharge status: Home / Self Care | Payer: Medicare Other | Attending: Family Medicine | Admitting: Family Medicine

## 2016-02-07 DIAGNOSIS — J0131 Acute recurrent sphenoidal sinusitis: Secondary | ICD-10-CM

## 2016-02-07 HISTORY — DX: Gastro-esophageal reflux disease without esophagitis: K21.9

## 2016-02-07 MED ORDER — DOXYCYCLINE HYCLATE 100 MG PO CAPS: 100.0000 mg | ORAL_CAPSULE | Freq: Two times a day (BID) | ORAL | Status: DC

## 2016-02-07 NOTE — ED Notes (Signed)
Patient c/o nasal congestion for 1 week.  Denies sore throat, cough, fever/c/n/v.

## 2016-02-07 NOTE — Discharge Instructions (Signed)
Sinus Rinse WHAT IS A SINUS RINSE? A sinus rinse is a simple home treatment that is used to rinse your sinuses with a sterile mixture of salt and water (saline solution). Sinuses are air-filled spaces in your skull behind the bones of your face and forehead that open into your nasal cavity. You will use the following:  Saline solution.  Neti pot or spray bottle. This releases the saline solution into your nose and through your sinuses. Neti pots and spray bottles can be purchased at [employer], a health food store, or online. WHEN WOULD I DO A SINUS RINSE? A sinus rinse can help to clear mucus, dirt, dust, or pollen from the nasal cavity. You may do a sinus rinse when you have a cold, a virus, nasal allergy symptoms, a sinus infection, or stuffiness in the nose or sinuses. If you are considering a sinus rinse:  Ask your child's health care provider before performing a sinus rinse on your child.  Do not do a sinus rinse if you have had ear or nasal surgery, ear infection, or blocked ears. HOW DO I DO A SINUS RINSE?  Wash your hands.  Disinfect your device according to the directions provided and then dry it.  Use the solution that comes with your device or one that is sold separately in stores. Follow the mixing directions on the package.  Fill your device with the amount of saline solution as directed by the device instructions.  Stand over a sink and tilt your head sideways over the sink.  Place the spout of the device in your upper nostril (the one closer to the ceiling).  Gently pour or squeeze the saline solution into the nasal cavity. The liquid should drain to the lower nostril if you are not overly congested.  Gently blow your nose. Blowing too hard may cause ear pain.  Repeat in the other nostril.  Clean and rinse your device with clean water and then air-dry it. ARE THERE RISKS OF A SINUS RINSE?  Sinus rinse is generally very safe and effective. However, there  are a few risks, which include:   A burning sensation in the sinuses. This may happen if you do not make the saline solution as directed. Make sure to follow all directions when making the saline solution.  Infection from contaminated water. This is rare, but possible.  Nasal irritation.   This information is not intended to replace advice given to you by your health care provider. Make sure you discuss any questions you have with your health care provider.   Document Released: 07/10/2014 Document Reviewed: 07/10/2014 Elsevier Interactive Patient Education 2016 [employer].  Sinusitis, Adult Sinusitis is redness, soreness, and inflammation of the paranasal sinuses. Paranasal sinuses are air pockets within the bones of your face. They are located beneath your eyes, in the middle of your forehead, and above your eyes. In healthy paranasal sinuses, mucus is able to drain out, and air is able to circulate through them by way of your nose. However, when your paranasal sinuses are inflamed, mucus and air can become trapped. This can allow bacteria and other germs to grow and cause infection. Sinusitis can develop quickly and last only a short time (acute) or continue over a long period (chronic). Sinusitis that lasts for more than 12 weeks is considered chronic. CAUSES Causes of sinusitis include:  Allergies.  Structural abnormalities, such as displacement of the cartilage that separates your nostrils (deviated septum), which can decrease the air flow  through your nose and sinuses and affect sinus drainage.  Functional abnormalities, such as when the small hairs (cilia) that line your sinuses and help remove mucus do not work properly or are not present. SIGNS AND SYMPTOMS Symptoms of acute and chronic sinusitis are the same. The primary symptoms are pain and pressure around the affected sinuses. Other symptoms include:  Upper toothache.  Earache.  Headache.  Bad breath.  Decreased  sense of smell and taste.  A cough, which worsens when you are lying flat.  Fatigue.  Fever.  Thick drainage from your nose, which often is green and may contain pus (purulent).  Swelling and warmth over the affected sinuses. DIAGNOSIS Your health care provider will perform a physical exam. During your exam, your health care provider may perform any of the following to help determine if you have acute sinusitis or chronic sinusitis:  Look in your nose for signs of abnormal growths in your nostrils (nasal polyps).  Tap over the affected sinus to check for signs of infection.  View the inside of your sinuses using an imaging device that has a light attached (endoscope). If your health care provider suspects that you have chronic sinusitis, one or more of the following tests may be recommended:  Allergy tests.  Nasal culture. A sample of mucus is taken from your nose, sent to a lab, and screened for bacteria.  Nasal cytology. A sample of mucus is taken from your nose and examined by your health care provider to determine if your sinusitis is related to an allergy. TREATMENT Most cases of acute sinusitis are related to a viral infection and will resolve on their own within 10 days. Sometimes, medicines are prescribed to help relieve symptoms of both acute and chronic sinusitis. These may include pain medicines, decongestants, nasal steroid sprays, or saline sprays. However, for sinusitis related to a bacterial infection, your health care provider will prescribe antibiotic medicines. These are medicines that will help kill the bacteria causing the infection. Rarely, sinusitis is caused by a fungal infection. In these cases, your health care provider will prescribe antifungal medicine. For some cases of chronic sinusitis, surgery is needed. Generally, these are cases in which sinusitis recurs more than 3 times per year, despite other treatments. HOME CARE INSTRUCTIONS  Drink plenty of  water. Water helps thin the mucus so your sinuses can drain more easily.  Use a humidifier.  Inhale steam 3-4 times a day (for example, sit in the bathroom with the shower running).  Apply a warm, moist washcloth to your face 3-4 times a day, or as directed by your health care provider.  Use saline nasal sprays to help moisten and clean your sinuses.  Take medicines only as directed by your health care provider.  If you were prescribed either an antibiotic or antifungal medicine, finish it all even if you start to feel better. SEEK IMMEDIATE MEDICAL CARE IF:  You have increasing pain or severe headaches.  You have nausea, vomiting, or drowsiness.  You have swelling around your face.  You have vision problems.  You have a stiff neck.  You have difficulty breathing.   This information is not intended to replace advice given to you by your health care provider. Make sure you discuss any questions you have with your health care provider.   Document Released: 12/13/2005 Document Revised: 01/03/2015 Document Reviewed: 12/28/2011 Elsevier Interactive Patient Education 2016 [employer]  Sinusitis, Adult Sinusitis is redness, soreness, and inflammation of the paranasal sinuses. Paranasal  sinuses are air pockets within the bones of your face. They are located beneath your eyes, in the middle of your forehead, and above your eyes. In healthy paranasal sinuses, mucus is able to drain out, and air is able to circulate through them by way of your nose. However, when your paranasal sinuses are inflamed, mucus and air can become trapped. This can allow bacteria and other germs to grow and cause infection. Sinusitis can develop quickly and last only a short time (acute) or continue over a long period (chronic). Sinusitis that lasts for more than 12 weeks is considered chronic. CAUSES Causes of sinusitis include:  Allergies.  Structural abnormalities, such as displacement of the cartilage  that separates your nostrils (deviated septum), which can decrease the air flow through your nose and sinuses and affect sinus drainage.  Functional abnormalities, such as when the small hairs (cilia) that line your sinuses and help remove mucus do not work properly or are not present. SIGNS AND SYMPTOMS Symptoms of acute and chronic sinusitis are the same. The primary symptoms are pain and pressure around the affected sinuses. Other symptoms include:  Upper toothache.  Earache.  Headache.  Bad breath.  Decreased sense of smell and taste.  A cough, which worsens when you are lying flat.  Fatigue.  Fever.  Thick drainage from your nose, which often is green and may contain pus (purulent).  Swelling and warmth over the affected sinuses. DIAGNOSIS Your health care provider will perform a physical exam. During your exam, your health care provider may perform any of the following to help determine if you have acute sinusitis or chronic sinusitis:  Look in your nose for signs of abnormal growths in your nostrils (nasal polyps).  Tap over the affected sinus to check for signs of infection.  View the inside of your sinuses using an imaging device that has a light attached (endoscope). If your health care provider suspects that you have chronic sinusitis, one or more of the following tests may be recommended:  Allergy tests.  Nasal culture. A sample of mucus is taken from your nose, sent to a lab, and screened for bacteria.  Nasal cytology. A sample of mucus is taken from your nose and examined by your health care provider to determine if your sinusitis is related to an allergy. TREATMENT Most cases of acute sinusitis are related to a viral infection and will resolve on their own within 10 days. Sometimes, medicines are prescribed to help relieve symptoms of both acute and chronic sinusitis. These may include pain medicines, decongestants, nasal steroid sprays, or saline  sprays. However, for sinusitis related to a bacterial infection, your health care provider will prescribe antibiotic medicines. These are medicines that will help kill the bacteria causing the infection. Rarely, sinusitis is caused by a fungal infection. In these cases, your health care provider will prescribe antifungal medicine. For some cases of chronic sinusitis, surgery is needed. Generally, these are cases in which sinusitis recurs more than 3 times per year, despite other treatments. HOME CARE INSTRUCTIONS  Drink plenty of water. Water helps thin the mucus so your sinuses can drain more easily.  Use a humidifier.  Inhale steam 3-4 times a day (for example, sit in the bathroom with the shower running).  Apply a warm, moist washcloth to your face 3-4 times a day, or as directed by your health care provider.  Use saline nasal sprays to help moisten and clean your sinuses.  Take medicines only as directed by  your health care provider.  If you were prescribed either an antibiotic or antifungal medicine, finish it all even if you start to feel better. SEEK IMMEDIATE MEDICAL CARE IF:  You have increasing pain or severe headaches.  You have nausea, vomiting, or drowsiness.  You have swelling around your face.  You have vision problems.  You have a stiff neck.  You have difficulty breathing.   This information is not intended to replace advice given to you by your health care provider. Make sure you discuss any questions you have with your health care provider.   Document Released: 12/13/2005 Document Revised: 01/03/2015 Document Reviewed: 12/28/2011 Elsevier Interactive Patient Education 2016 Elsevier Inc.  Sinusitis, Adult Sinusitis is redness, soreness, and inflammation of the paranasal sinuses. Paranasal sinuses are air pockets within the bones of your face. They are located beneath your eyes, in the middle of your forehead, and above your eyes. In healthy paranasal sinuses,  mucus is able to drain out, and air is able to circulate through them by way of your nose. However, when your paranasal sinuses are inflamed, mucus and air can become trapped. This can allow bacteria and other germs to grow and cause infection. Sinusitis can develop quickly and last only a short time (acute) or continue over a long period (chronic). Sinusitis that lasts for more than 12 weeks is considered chronic. CAUSES Causes of sinusitis include:  Allergies.  Structural abnormalities, such as displacement of the cartilage that separates your nostrils (deviated septum), which can decrease the air flow through your nose and sinuses and affect sinus drainage.  Functional abnormalities, such as when the small hairs (cilia) that line your sinuses and help remove mucus do not work properly or are not present. SIGNS AND SYMPTOMS Symptoms of acute and chronic sinusitis are the same. The primary symptoms are pain and pressure around the affected sinuses. Other symptoms include:  Upper toothache.  Earache.  Headache.  Bad breath.  Decreased sense of smell and taste.  A cough, which worsens when you are lying flat.  Fatigue.  Fever.  Thick drainage from your nose, which often is green and may contain pus (purulent).  Swelling and warmth over the affected sinuses. DIAGNOSIS Your health care provider will perform a physical exam. During your exam, your health care provider may perform any of the following to help determine if you have acute sinusitis or chronic sinusitis:  Look in your nose for signs of abnormal growths in your nostrils (nasal polyps).  Tap over the affected sinus to check for signs of infection.  View the inside of your sinuses using an imaging device that has a light attached (endoscope). If your health care provider suspects that you have chronic sinusitis, one or more of the following tests may be recommended:  Allergy tests.  Nasal culture. A sample of mucus  is taken from your nose, sent to a lab, and screened for bacteria.  Nasal cytology. A sample of mucus is taken from your nose and examined by your health care provider to determine if your sinusitis is related to an allergy. TREATMENT Most cases of acute sinusitis are related to a viral infection and will resolve on their own within 10 days. Sometimes, medicines are prescribed to help relieve symptoms of both acute and chronic sinusitis. These may include pain medicines, decongestants, nasal steroid sprays, or saline sprays. However, for sinusitis related to a bacterial infection, your health care provider will prescribe antibiotic medicines. These are medicines that will help kill  the bacteria causing the infection. Rarely, sinusitis is caused by a fungal infection. In these cases, your health care provider will prescribe antifungal medicine. For some cases of chronic sinusitis, surgery is needed. Generally, these are cases in which sinusitis recurs more than 3 times per year, despite other treatments. HOME CARE INSTRUCTIONS  Drink plenty of water. Water helps thin the mucus so your sinuses can drain more easily.  Use a humidifier.  Inhale steam 3-4 times a day (for example, sit in the bathroom with the shower running).  Apply a warm, moist washcloth to your face 3-4 times a day, or as directed by your health care provider.  Use saline nasal sprays to help moisten and clean your sinuses.  Take medicines only as directed by your health care provider.  If you were prescribed either an antibiotic or antifungal medicine, finish it all even if you start to feel better. SEEK IMMEDIATE MEDICAL CARE IF:  You have increasing pain or severe headaches.  You have nausea, vomiting, or drowsiness.  You have swelling around your face.  You have vision problems.  You have a stiff neck.  You have difficulty breathing.   This information is not intended to replace advice given to you by your  health care provider. Make sure you discuss any questions you have with your health care provider.   Document Released: 12/13/2005 Document Revised: 01/03/2015 Document Reviewed: 12/28/2011 Elsevier Interactive Patient Education [employer].

## 2016-02-07 NOTE — ED Provider Notes (Signed)
CSN: [id number]     Arrival date & time 02/07/16  1212 History   First MD Initiated Contact with Patient 02/07/16 1459     No chief complaint on file.  (Consider location/radiation/quality/duration/timing/severity/associated sxs/prior Treatment) HPI   75 year old female who presents with a nasal congestion and facial pain over the side just below her right orbit and to her ear. It is a throbbing sensation in her teeth hurt. She has had a rather copious yellow greenish sputum especially in the mornings. She denies any fever or chills. Been bothering her for about a week and a half at the present time. In reviewing her previous records she had been seen here in November and treated for recurrent sphenoid sinusitis with doxycycline. Did improve over time. She had been seen by Dr. Ladene Artist, ENT, in the past with his stated that she had a chronic sinusitis that had smoldered and not completely healed. As we initiated treatment with the doxycycline but since then has been recurrent. He has tried to "tough it out" that is only worsened over the last several days.  Past Medical History  Diagnosis Date  . Stroke ([place])   . Coronary artery disease   . Acid reflux    Past Surgical History  Procedure Laterality Date  . Breast biopsy Left 1990's    lt bx x 2-neg  . Abdominal hysterectomy    . Tonsillectomy    . Cardiac surgery     Family History  Problem Relation Age of Onset  . Breast cancer Cousin 61   Social History  Substance Use Topics  . Smoking status: Never Smoker   . Smokeless tobacco: Never Used  . Alcohol Use: Yes     Comment: Socially   OB History    No data available     Review of Systems  Constitutional: Positive for fatigue. Negative for fever, chills and appetite change.  HENT: Positive for congestion, ear pain, facial swelling, rhinorrhea and sinus pressure.   Respiratory: Negative for cough and shortness of breath.   All other systems reviewed and are  negative.   Allergies  Amoxicillin and Septra  Home Medications   Prior to Admission medications   Medication Sig Start Date End Date Taking? Authorizing Provider  aspirin 81 MG tablet Take 81 mg by mouth daily.   Yes Historical Provider, MD  cholecalciferol (VITAMIN D) 1000 UNITS tablet Take 1,000 Units by mouth daily.   Yes Historical Provider, MD  Fenofibrate 40 MG TABS Take 80 mg by mouth daily.   Yes Historical Provider, MD  fluticasone (FLONASE) 50 MCG/ACT nasal spray Place 1 spray into both nostrils daily.   Yes Historical Provider, MD  sodium chloride (OCEAN) 0.65 % SOLN nasal spray Place 2 sprays into both nostrils every 2 (two) hours while awake. 11/07/15  Yes Olen Cordial, NP  vitamin B-12 (CYANOCOBALAMIN) 1000 MCG tablet Take 1,000 mcg by mouth daily.   Yes Historical Provider, MD  acetaminophen (TYLENOL) 500 MG tablet Take 1 tablet (500 mg total) by mouth every 6 (six) hours as needed. 11/07/15   Olen Cordial, NP  Calcium Carbonate Antacid (MAALOX PO) Take by mouth.    Historical Provider, MD  doxycycline (VIBRAMYCIN) 100 MG capsule Take 1 capsule (100 mg total) by mouth 2 (two) times daily. 02/07/16   Lorin Picket, PA-C  famotidine (PEPCID AC) 10 MG chewable tablet Chew 10 mg by mouth as needed for heartburn.    Historical Provider, MD   Meds Ordered and  Administered this Visit  Medications - No data to display  BP 129/77 mmHg  Pulse 70  Temp(Src) 98 F (36.7 C) (Oral)  Resp 16  Ht 5\' 1"  (1.549 m)  Wt 149 lb (67.586 kg)  BMI 28.17 kg/m2  SpO2 100% No data found.   Physical Exam  Constitutional: She is oriented to person, place, and time. She appears well-developed and well-nourished. No distress.  HENT:  Head: Normocephalic and atraumatic.  Left Ear: External ear normal.  Nose: Nose normal.  Mouth/Throat: Oropharynx is clear and moist. No oropharyngeal exudate.  TM is dull with loss of the light reflex. There is tenderness to percussion over the  sphenoid sinus right extending over to the ear. Pharynx is benign.  Eyes: Conjunctivae are normal. Pupils are equal, round, and reactive to light.  Neck: Normal range of motion. Neck supple.  Pulmonary/Chest: Effort normal and breath sounds normal. No respiratory distress. She has no wheezes. She has no rales.  Musculoskeletal: Normal range of motion. She exhibits no edema or tenderness.  Lymphadenopathy:    She has no cervical adenopathy.  Neurological: She is alert and oriented to person, place, and time.  Skin: Skin is warm and dry. She is not diaphoretic.  Psychiatric: She has a normal mood and affect. Her behavior is normal. Judgment and thought content normal.  Nursing note and vitals reviewed.   ED Course  Procedures (including critical care time)  Labs Review Labs Reviewed - No data to display  Imaging Review No results found.   Visual Acuity Review  Right Eye Distance:   Left Eye Distance:   Bilateral Distance:    Right Eye Near:   Left Eye Near:    Bilateral Near:         MDM   1. Acute recurrent sphenoidal sinusitis    Discharge Medication List as of 02/07/2016  3:26 PM    Plan: 1. Test/x-ray results and diagnosis reviewed with patient 2. rx as per orders; risks, benefits, potential side effects reviewed with patient 3. Recommend supportive treatment with sinus rinse and fluids. Will initiate some treatment with cycling since she has had this chronically and recurrently supported by the diagnosis by the ENT. Have asked her to investigate the possibilities of using an Netty pot. Is any further problems or worsens she should return to the ENT for further evaluation and treatment 4. F/u prn if symptoms worsen or don't improve     Lorin Picket, PA-C 02/07/16 1610

## 2016-03-08 DIAGNOSIS — R09A2 Foreign body sensation, throat: Secondary | ICD-10-CM | POA: Insufficient documentation

## 2016-03-08 DIAGNOSIS — F458 Other somatoform disorders: Secondary | ICD-10-CM | POA: Diagnosis not present

## 2016-03-08 DIAGNOSIS — I639 Cerebral infarction, unspecified: Secondary | ICD-10-CM | POA: Diagnosis not present

## 2016-03-08 DIAGNOSIS — R002 Palpitations: Secondary | ICD-10-CM | POA: Diagnosis not present

## 2016-03-08 DIAGNOSIS — I10 Essential (primary) hypertension: Secondary | ICD-10-CM | POA: Diagnosis not present

## 2016-03-08 DIAGNOSIS — E782 Mixed hyperlipidemia: Secondary | ICD-10-CM | POA: Diagnosis not present

## 2016-03-22 DIAGNOSIS — F458 Other somatoform disorders: Secondary | ICD-10-CM | POA: Diagnosis not present

## 2016-05-26 DIAGNOSIS — D485 Neoplasm of uncertain behavior of skin: Secondary | ICD-10-CM | POA: Diagnosis not present

## 2016-05-26 DIAGNOSIS — L820 Inflamed seborrheic keratosis: Secondary | ICD-10-CM | POA: Diagnosis not present

## 2016-07-20 DIAGNOSIS — J329 Chronic sinusitis, unspecified: Secondary | ICD-10-CM | POA: Diagnosis not present

## 2016-07-20 DIAGNOSIS — R0982 Postnasal drip: Secondary | ICD-10-CM | POA: Diagnosis not present

## 2016-07-29 DIAGNOSIS — Z9189 Other specified personal risk factors, not elsewhere classified: Secondary | ICD-10-CM | POA: Diagnosis not present

## 2016-07-29 DIAGNOSIS — R350 Frequency of micturition: Secondary | ICD-10-CM | POA: Diagnosis not present

## 2016-08-10 DIAGNOSIS — I639 Cerebral infarction, unspecified: Secondary | ICD-10-CM | POA: Diagnosis not present

## 2016-08-10 DIAGNOSIS — I10 Essential (primary) hypertension: Secondary | ICD-10-CM | POA: Diagnosis not present

## 2016-08-10 DIAGNOSIS — E782 Mixed hyperlipidemia: Secondary | ICD-10-CM | POA: Diagnosis not present

## 2016-08-10 DIAGNOSIS — I2510 Atherosclerotic heart disease of native coronary artery without angina pectoris: Secondary | ICD-10-CM | POA: Diagnosis not present

## 2016-09-03 DIAGNOSIS — L821 Other seborrheic keratosis: Secondary | ICD-10-CM | POA: Diagnosis not present

## 2016-09-07 DIAGNOSIS — I2510 Atherosclerotic heart disease of native coronary artery without angina pectoris: Secondary | ICD-10-CM | POA: Diagnosis not present

## 2016-09-07 DIAGNOSIS — Z0000 Encounter for general adult medical examination without abnormal findings: Secondary | ICD-10-CM | POA: Diagnosis not present

## 2016-09-07 DIAGNOSIS — E7800 Pure hypercholesterolemia, unspecified: Secondary | ICD-10-CM | POA: Diagnosis not present

## 2016-09-07 DIAGNOSIS — I10 Essential (primary) hypertension: Secondary | ICD-10-CM | POA: Diagnosis not present

## 2016-09-13 DIAGNOSIS — Z23 Encounter for immunization: Secondary | ICD-10-CM | POA: Diagnosis not present

## 2016-09-15 DIAGNOSIS — E7800 Pure hypercholesterolemia, unspecified: Secondary | ICD-10-CM | POA: Diagnosis not present

## 2016-09-15 DIAGNOSIS — I10 Essential (primary) hypertension: Secondary | ICD-10-CM | POA: Diagnosis not present

## 2016-09-15 DIAGNOSIS — Z1239 Encounter for other screening for malignant neoplasm of breast: Secondary | ICD-10-CM | POA: Diagnosis not present

## 2016-09-28 ENCOUNTER — Other Ambulatory Visit: Payer: Self-pay | Admitting: Infectious Diseases

## 2016-09-28 DIAGNOSIS — Z1231 Encounter for screening mammogram for malignant neoplasm of breast: Secondary | ICD-10-CM

## 2016-10-05 DIAGNOSIS — H2513 Age-related nuclear cataract, bilateral: Secondary | ICD-10-CM | POA: Diagnosis not present

## 2016-10-06 ENCOUNTER — Ambulatory Visit
Admission: RE | Admit: 2016-10-06 | Discharge: 2016-10-06 | Disposition: A | Discharge status: Home / Self Care | Payer: Medicare Other | Source: Ambulatory Visit | Attending: Infectious Diseases | Admitting: Infectious Diseases

## 2016-10-06 DIAGNOSIS — R921 Mammographic calcification found on diagnostic imaging of breast: Secondary | ICD-10-CM | POA: Diagnosis not present

## 2016-10-06 DIAGNOSIS — Z1231 Encounter for screening mammogram for malignant neoplasm of breast: Secondary | ICD-10-CM | POA: Insufficient documentation

## 2016-10-11 ENCOUNTER — Other Ambulatory Visit: Payer: Self-pay | Admitting: Infectious Diseases

## 2016-10-11 DIAGNOSIS — N6489 Other specified disorders of breast: Secondary | ICD-10-CM

## 2016-10-11 DIAGNOSIS — R921 Mammographic calcification found on diagnostic imaging of breast: Secondary | ICD-10-CM

## 2016-10-26 ENCOUNTER — Ambulatory Visit
Admission: RE | Admit: 2016-10-26 | Discharge: 2016-10-26 | Disposition: A | Discharge status: Home / Self Care | Payer: Medicare Other | Source: Ambulatory Visit | Attending: Infectious Diseases | Admitting: Infectious Diseases

## 2016-10-26 DIAGNOSIS — N6489 Other specified disorders of breast: Secondary | ICD-10-CM | POA: Diagnosis not present

## 2016-10-26 DIAGNOSIS — R928 Other abnormal and inconclusive findings on diagnostic imaging of breast: Secondary | ICD-10-CM | POA: Diagnosis not present

## 2016-10-26 DIAGNOSIS — R921 Mammographic calcification found on diagnostic imaging of breast: Secondary | ICD-10-CM | POA: Insufficient documentation

## 2016-12-28 DIAGNOSIS — E7800 Pure hypercholesterolemia, unspecified: Secondary | ICD-10-CM | POA: Diagnosis not present

## 2016-12-28 DIAGNOSIS — R079 Chest pain, unspecified: Secondary | ICD-10-CM | POA: Diagnosis not present

## 2016-12-28 DIAGNOSIS — I2510 Atherosclerotic heart disease of native coronary artery without angina pectoris: Secondary | ICD-10-CM | POA: Diagnosis not present

## 2016-12-28 DIAGNOSIS — G459 Transient cerebral ischemic attack, unspecified: Secondary | ICD-10-CM | POA: Diagnosis not present

## 2016-12-28 DIAGNOSIS — I639 Cerebral infarction, unspecified: Secondary | ICD-10-CM | POA: Diagnosis not present

## 2017-01-07 DIAGNOSIS — R079 Chest pain, unspecified: Secondary | ICD-10-CM | POA: Diagnosis not present

## 2017-01-14 ENCOUNTER — Other Ambulatory Visit: Payer: Self-pay | Admitting: Cardiology

## 2017-01-14 DIAGNOSIS — E7800 Pure hypercholesterolemia, unspecified: Secondary | ICD-10-CM | POA: Diagnosis not present

## 2017-01-14 DIAGNOSIS — I2510 Atherosclerotic heart disease of native coronary artery without angina pectoris: Secondary | ICD-10-CM | POA: Diagnosis not present

## 2017-01-14 DIAGNOSIS — I639 Cerebral infarction, unspecified: Secondary | ICD-10-CM | POA: Diagnosis not present

## 2017-01-14 MED ORDER — SODIUM CHLORIDE 0.9 % WEIGHT BASED INFUSION: 1.0000 mL/kg/h | INTRAVENOUS | Status: AC

## 2017-01-14 MED ORDER — SODIUM CHLORIDE 0.9 % IV SOLN: 250.0000 mL | INTRAVENOUS | Status: AC | PRN

## 2017-01-14 MED ORDER — SODIUM CHLORIDE 0.9% FLUSH: 3.0000 mL | INTRAVENOUS | Status: AC | PRN

## 2017-01-14 MED ORDER — ASPIRIN 81 MG PO CHEW: 81.0000 mg | CHEWABLE_TABLET | ORAL | Status: AC

## 2017-01-14 MED ORDER — SODIUM CHLORIDE 0.9 % WEIGHT BASED INFUSION: 3.0000 mL/kg/h | INTRAVENOUS | Status: AC

## 2017-01-14 MED ORDER — SODIUM CHLORIDE 0.9% FLUSH: 3.0000 mL | Freq: Two times a day (BID) | INTRAVENOUS | Status: AC

## 2017-01-19 ENCOUNTER — Encounter
Admission: RE | Disposition: A | Discharge status: Home / Self Care | Payer: Self-pay | Source: Ambulatory Visit | Attending: Cardiology

## 2017-01-19 ENCOUNTER — Encounter: Payer: Self-pay | Admitting: *Deleted

## 2017-01-19 ENCOUNTER — Observation Stay
Admission: RE | Admit: 2017-01-19 | Discharge: 2017-01-20 | Disposition: A | Discharge status: Home / Self Care | Payer: Medicare Other | Source: Ambulatory Visit | Attending: Cardiology | Admitting: Cardiology

## 2017-01-19 DIAGNOSIS — E782 Mixed hyperlipidemia: Secondary | ICD-10-CM | POA: Diagnosis not present

## 2017-01-19 DIAGNOSIS — Z79899 Other long term (current) drug therapy: Secondary | ICD-10-CM | POA: Diagnosis not present

## 2017-01-19 DIAGNOSIS — I200 Unstable angina: Secondary | ICD-10-CM | POA: Diagnosis not present

## 2017-01-19 DIAGNOSIS — I25110 Atherosclerotic heart disease of native coronary artery with unstable angina pectoris: Secondary | ICD-10-CM | POA: Diagnosis not present

## 2017-01-19 DIAGNOSIS — Z7982 Long term (current) use of aspirin: Secondary | ICD-10-CM | POA: Diagnosis not present

## 2017-01-19 DIAGNOSIS — I10 Essential (primary) hypertension: Secondary | ICD-10-CM | POA: Insufficient documentation

## 2017-01-19 DIAGNOSIS — R002 Palpitations: Secondary | ICD-10-CM | POA: Diagnosis not present

## 2017-01-19 DIAGNOSIS — I2510 Atherosclerotic heart disease of native coronary artery without angina pectoris: Secondary | ICD-10-CM | POA: Diagnosis present

## 2017-01-19 DIAGNOSIS — I25719 Atherosclerosis of autologous vein coronary artery bypass graft(s) with unspecified angina pectoris: Secondary | ICD-10-CM | POA: Diagnosis not present

## 2017-01-19 DIAGNOSIS — R079 Chest pain, unspecified: Secondary | ICD-10-CM | POA: Diagnosis present

## 2017-01-19 DIAGNOSIS — Z8673 Personal history of transient ischemic attack (TIA), and cerebral infarction without residual deficits: Secondary | ICD-10-CM | POA: Diagnosis not present

## 2017-01-19 HISTORY — PX: CARDIAC CATHETERIZATION: SHX172

## 2017-01-19 LAB — CREATININE, SERUM
Creatinine, Ser: 0.94 mg/dL (ref 0.44–1.00)
GFR calc Af Amer: >60 mL/min (ref >60)
GFR calc non Af Amer: 57 mL/min — ABNORMAL LOW (ref >60)

## 2017-01-19 LAB — MRSA PCR SCREENING: MRSA BY PCR: NEGATIVE

## 2017-01-19 LAB — GLUCOSE, CAPILLARY: GLUCOSE-CAPILLARY: 110 mg/dL — AB (ref 65–99)

## 2017-01-19 SURGERY — LEFT HEART CATH AND CORONARY ANGIOGRAPHY
Anesthesia: Moderate Sedation

## 2017-01-19 MED ORDER — ASPIRIN 81 MG PO CHEW
CHEWABLE_TABLET | ORAL | Status: DC | PRN
  Administered 2017-01-19: 324 mg via ORAL

## 2017-01-19 MED ORDER — TIROFIBAN HCL IV 12.5 MG/250 ML
0.0750 ug/kg/min | INTRAVENOUS | Status: AC
  Administered 2017-01-19: 0.075 ug/kg/min via INTRAVENOUS
  Filled 2017-01-19: qty 250

## 2017-01-19 MED ORDER — TIROFIBAN HCL IV 12.5 MG/250 ML: 0.0750 ug/kg/min | INTRAVENOUS | Status: DC

## 2017-01-19 MED ORDER — IOPAMIDOL (ISOVUE-300) INJECTION 61%
INTRAVENOUS | Status: DC | PRN
  Administered 2017-01-19: 70 mL via INTRAVENOUS

## 2017-01-19 MED ORDER — BIVALIRUDIN BOLUS VIA INFUSION - CUPID
INTRAVENOUS | Status: DC | PRN
  Administered 2017-01-19: 51 mg via INTRAVENOUS

## 2017-01-19 MED ORDER — FAMOTIDINE IN NACL 20-0.9 MG/50ML-% IV SOLN
20.0000 mg | Freq: Once | INTRAVENOUS | Status: DC
  Filled 2017-01-19: qty 50

## 2017-01-19 MED ORDER — HEPARIN (PORCINE) IN NACL 2-0.9 UNIT/ML-% IJ SOLN
INTRAMUSCULAR | Status: AC
  Filled 2017-01-19: qty 500

## 2017-01-19 MED ORDER — ASPIRIN 81 MG PO CHEW
CHEWABLE_TABLET | ORAL | Status: AC
  Filled 2017-01-19: qty 4

## 2017-01-19 MED ORDER — TIROFIBAN HCL IV 12.5 MG/250 ML
INTRAVENOUS | Status: AC
  Filled 2017-01-19: qty 250

## 2017-01-19 MED ORDER — SODIUM CHLORIDE 0.9% FLUSH: 3.0000 mL | INTRAVENOUS | Status: DC | PRN

## 2017-01-19 MED ORDER — ALUM & MAG HYDROXIDE-SIMETH 200-200-20 MG/5ML PO SUSP
ORAL | Status: AC
  Filled 2017-01-19: qty 30

## 2017-01-19 MED ORDER — CLOPIDOGREL BISULFATE 300 MG PO TABS
ORAL_TABLET | ORAL | Status: AC
Start: 2017-01-19 — End: 2017-01-19
  Filled 2017-01-19: qty 2

## 2017-01-19 MED ORDER — HYDRALAZINE HCL 20 MG/ML IJ SOLN: 5.0000 mg | INTRAMUSCULAR | Status: AC | PRN

## 2017-01-19 MED ORDER — SODIUM CHLORIDE 0.9 % IV SOLN
INTRAVENOUS | Status: DC
  Administered 2017-01-19 (×2): via INTRAVENOUS

## 2017-01-19 MED ORDER — TIROFIBAN HCL IN NACL 5-0.9 MG/100ML-% IV SOLN
INTRAVENOUS | Status: DC | PRN
  Administered 2017-01-19: 0.15 ug/kg/min via INTRAVENOUS

## 2017-01-19 MED ORDER — MIDAZOLAM HCL 2 MG/2ML IJ SOLN
INTRAMUSCULAR | Status: DC | PRN
  Administered 2017-01-19 (×2): 1 mg via INTRAVENOUS

## 2017-01-19 MED ORDER — ONDANSETRON HCL 4 MG/2ML IJ SOLN
4.0000 mg | Freq: Four times a day (QID) | INTRAMUSCULAR | Status: DC | PRN
Start: 2017-01-19 — End: 2017-01-20

## 2017-01-19 MED ORDER — SODIUM CHLORIDE 0.9% FLUSH
3.0000 mL | Freq: Two times a day (BID) | INTRAVENOUS | Status: DC
  Administered 2017-01-19 (×2): 3 mL via INTRAVENOUS

## 2017-01-19 MED ORDER — CLOPIDOGREL BISULFATE 75 MG PO TABS
75.0000 mg | ORAL_TABLET | Freq: Every day | ORAL | Status: DC
  Administered 2017-01-20: 75 mg via ORAL
  Filled 2017-01-19: qty 1

## 2017-01-19 MED ORDER — NITROGLYCERIN 1 MG/10 ML FOR IR/CATH LAB
INTRA_ARTERIAL | Status: DC | PRN
  Administered 2017-01-19: 200 ug

## 2017-01-19 MED ORDER — BIVALIRUDIN 250 MG IV SOLR
INTRAVENOUS | Status: AC
Start: 2017-01-19 — End: 2017-01-19
  Filled 2017-01-19: qty 250

## 2017-01-19 MED ORDER — IOPAMIDOL (ISOVUE-300) INJECTION 61%
INTRAVENOUS | Status: DC | PRN
  Administered 2017-01-19: 55 mL via INTRA_ARTERIAL

## 2017-01-19 MED ORDER — LABETALOL HCL 5 MG/ML IV SOLN: 10.0000 mg | INTRAVENOUS | Status: AC | PRN

## 2017-01-19 MED ORDER — SODIUM CHLORIDE 0.9 % IV SOLN
INTRAVENOUS | Status: DC | PRN
  Administered 2017-01-19: 1.75 mg/kg/h via INTRAVENOUS

## 2017-01-19 MED ORDER — LIDOCAINE HCL (PF) 1 % IJ SOLN: INTRAMUSCULAR | Status: DC | PRN

## 2017-01-19 MED ORDER — MIDAZOLAM HCL 2 MG/2ML IJ SOLN
INTRAMUSCULAR | Status: AC
  Filled 2017-01-19: qty 2

## 2017-01-19 MED ORDER — FENTANYL CITRATE (PF) 100 MCG/2ML IJ SOLN
INTRAMUSCULAR | Status: DC | PRN
  Administered 2017-01-19 (×2): 25 ug via INTRAVENOUS

## 2017-01-19 MED ORDER — FAMOTIDINE IN NACL 20-0.9 MG/50ML-% IV SOLN
INTRAVENOUS | Status: DC | PRN
  Administered 2017-01-19: 20 mg via INTRAVENOUS

## 2017-01-19 MED ORDER — SODIUM CHLORIDE 0.9 % IV SOLN: 250.0000 mL | INTRAVENOUS | Status: DC | PRN

## 2017-01-19 MED ORDER — ACETAMINOPHEN 325 MG PO TABS
650.0000 mg | ORAL_TABLET | ORAL | Status: DC | PRN
  Administered 2017-01-19: 650 mg via ORAL
  Filled 2017-01-19 (×2): qty 2

## 2017-01-19 MED ORDER — TIROFIBAN HCL IN NACL 5-0.9 MG/100ML-% IV SOLN
INTRAVENOUS | Status: AC
  Filled 2017-01-19: qty 100

## 2017-01-19 MED ORDER — TIROFIBAN (AGGRASTAT) BOLUS VIA INFUSION
INTRAVENOUS | Status: DC | PRN
  Administered 2017-01-19: 1700 ug via INTRAVENOUS

## 2017-01-19 MED ORDER — FENTANYL CITRATE (PF) 100 MCG/2ML IJ SOLN
INTRAMUSCULAR | Status: AC
  Filled 2017-01-19: qty 2

## 2017-01-19 MED ORDER — ASPIRIN 81 MG PO CHEW
81.0000 mg | CHEWABLE_TABLET | Freq: Every day | ORAL | Status: DC
  Administered 2017-01-19 – 2017-01-20 (×2): 81 mg via ORAL
  Filled 2017-01-19 (×2): qty 1

## 2017-01-19 MED ORDER — CLOPIDOGREL BISULFATE 75 MG PO TABS
ORAL_TABLET | ORAL | Status: DC | PRN
  Administered 2017-01-19: 300 mg via ORAL

## 2017-01-19 MED ORDER — NITROGLYCERIN 5 MG/ML IV SOLN
INTRAVENOUS | Status: AC
Start: 2017-01-19 — End: 2017-01-19
  Filled 2017-01-19: qty 10

## 2017-01-19 MED ORDER — HEPARIN (PORCINE) IN NACL 2-0.9 UNIT/ML-% IJ SOLN
INTRAMUSCULAR | Status: AC
  Filled 2017-01-19: qty 1000

## 2017-01-19 MED ORDER — SODIUM CHLORIDE 0.9 % WEIGHT BASED INFUSION: 1.0000 mL/kg/h | INTRAVENOUS | Status: DC

## 2017-01-19 SURGICAL SUPPLY — 24 items
BALLN TREK RX 2.5X15 (BALLOONS) ×3
BALLN ~~LOC~~ TREK RX 2.75X12 (BALLOONS) ×3
BALLOON TREK RX 2.5X15 (BALLOONS) ×1 IMPLANT
BALLOON ~~LOC~~ TREK RX 2.75X12 (BALLOONS) ×1 IMPLANT
CATH 5FR JR4 DIAGNOSTIC (CATHETERS) ×3 IMPLANT
CATH INFINITI 5FR ANG PIGTAIL (CATHETERS) ×3 IMPLANT
CATH INFINITI 5FR JL4 (CATHETERS) ×3 IMPLANT
CATH VISTA GUIDE 6FR 3DRC (CATHETERS) ×3 IMPLANT
CATH VISTA GUIDE 6FR AR1 (CATHETERS) ×3 IMPLANT
CATH VISTA GUIDE 6FR JR4 SH (CATHETERS) ×3 IMPLANT
CATH VISTA GUIDE 6FR MPA1 (CATHETERS) ×3 IMPLANT
DEVICE CLOSURE MYNXGRIP 6/7F (Vascular Products) ×3 IMPLANT
DEVICE INFLAT 30 PLUS (MISCELLANEOUS) ×3 IMPLANT
DEVICE SAFEGUARD 24CM (GAUZE/BANDAGES/DRESSINGS) ×3 IMPLANT
FEM STOP ARCH (HEMOSTASIS) ×2
KIT MANI 3VAL PERCEP (MISCELLANEOUS) ×3 IMPLANT
NEEDLE PERC 18GX7CM (NEEDLE) ×3 IMPLANT
PACK CARDIAC CATH (CUSTOM PROCEDURE TRAY) ×3 IMPLANT
SHEATH AVANTI 5FR X 11CM (SHEATH) ×3 IMPLANT
SHEATH AVANTI 6FR X 11CM (SHEATH) ×3 IMPLANT
STENT XIENCE ALPINE RX 2.5X15 (Permanent Stent) ×3 IMPLANT
SYSTEM COMPRESSION FEMOSTOP (HEMOSTASIS) ×1 IMPLANT
WIRE ASAHI PROWATER 180CM (WIRE) ×6 IMPLANT
WIRE EMERALD 3MM-J .035X150CM (WIRE) ×3 IMPLANT

## 2017-01-19 NOTE — H&P (Signed)
Chief Complaint: Chief Complaint  Patient presents with  . Follow-up  stress echo  Date of Service: 01/14/2017 Date of Birth: 08-Sep-1941 PCP: DAVID Ashley Akin, MD  History of Present Illness: Kristen Lloyd is a 76 y.o.female patient who returns for follow-up visit. She complains of shortness of breath as well as a burning sensation in the middle of her chest. It extends from the middle of her chest outward. She denies syncope or presyncope. Echocardiogram done in January of this year revealed normal LV function with mild MR and TR. Holter monitor revealed average heart rate of 71 with minimal heart rate of 57 maximal heart rate of 119. There were no sustained arrhythmias. She underwent an ETT echo with fair exercise tolerance with symptoms. There was no high-grade evidence of ischemia however patient had severe chest pain with this. Some of the symptoms are similar to what she had prior to her previous stent. Past Medical and Surgical History  Past Medical History Past Medical History:  Diagnosis Date  . Allergic state  . Atrophic vaginitis  . CAD (coronary artery disease)  . Cataract cortical, senile  . Diverticulosis  noted to be extensive 2015  . History of shingles  . Hyperlipidemia, unspecified  . Hypertension  . Osteoarthritis  . Osteopenia  . Sinusitis, unspecified  . Spastic colon  . Stroke (CMS-HCC)  . TIA (transient ischemic attack) 03/2014  Transient slurred speech - cerebellar - follows with Dr Manuella Ghazi   Past Surgical History She has a past surgical history that includes Hysterectomy Vaginal (1963); Breast excisional biopsy; cardiac cath (01/05/10); and Tonsillectomy (1957).   Medications and Allergies  Current Medications  Current Outpatient Prescriptions  Medication Sig Dispense Refill  . aspirin 81 MG EC tablet Take 81 mg by mouth once daily.  . cholecalciferol (VITAMIN D3) 1,000 unit tablet Take 1 Units by mouth once daily.  . clidinium-chlordiazepoxide  (LIBRAX) 5-2.5 mg capsule Take 1 capsule by mouth every 6 (six) hours as needed. 30 capsule 3  . fenofibrate 160 MG tablet Take 1 tablet (160 mg total) by mouth once daily. 90 tablet 3  . fluticasone (FLONASE) 50 mcg/actuation nasal spray Place 2 sprays into both nostrils once daily. 16 g 3  . traMADol (ULTRAM) 50 mg tablet Take 1 tablet (50 mg total) by mouth every 6 (six) hours as needed for Pain. 60 tablet 5   No current facility-administered medications for this visit.   Allergies: Amoxicillin; Norvasc [amlodipine]; and Sulfamethoxazole-trimethoprim  Social and Family History  Social History reports that she has never smoked. She has never used smokeless tobacco. She reports that she drinks about 4.2 oz of alcohol per week . She reports that she does not use drugs.  Family History Family History  Problem Relation Age of Onset  . Asthma Mother  . Hyperlipidemia (Elevated cholesterol) Mother  . Thyroid disease Mother  . Stroke Father  . Dementia Father  . Hyperlipidemia (Elevated cholesterol) Sister  . Colon polyps Brother   Review of Systems  Review of Systems  Constitutional: Negative for chills, diaphoresis, fever and weight loss.  HENT: Negative for congestion, ear discharge, hearing loss and tinnitus.  Eyes: Negative for blurred vision.  Respiratory: Negative for cough, hemoptysis, sputum production and wheezing.  Cardiovascular: Positive for chest pain. Negative for orthopnea, claudication, leg swelling and PND.  Gastrointestinal: Negative for abdominal pain, blood in stool, constipation, diarrhea, heartburn, melena, nausea and vomiting.  Genitourinary: Negative for dysuria, frequency, hematuria and urgency.  Musculoskeletal: Negative for back  pain, falls, joint pain and myalgias.  Skin: Negative for itching and rash.  Neurological: Negative for dizziness, tingling, focal weakness, loss of consciousness, weakness and headaches.  Endo/Heme/Allergies: Negative for  polydipsia. Does not bruise/bleed easily.  Psychiatric/Behavioral: Negative for depression, memory loss and substance abuse. The patient is not nervous/anxious.    Physical Examination   Vitals: BP 130/80  Pulse 76  Resp 12  Ht 180.3 cm (5\' 11" )  Wt 68 kg (150 lb)  BMI 20.92 kg/m  Ht:180.3 cm (5\' 11" ) Wt:68 kg (150 lb) [id number] surface area is 1.85 meters squared. Body mass index is 20.92 kg/m.  Wt Readings from Last 3 Encounters:  01/14/17 68 kg (150 lb)  12/28/16 68 kg (150 lb)  09/15/16 68 kg (150 lb)   BP Readings from Last 3 Encounters:  01/14/17 130/80  12/28/16 144/78  09/15/16 138/82   General appearance appears in no acute distress  Head Mouth and Eye exam Normocephalic, without obvious abnormality, atraumatic Dentition is good Eyes appear anicteric   Neck exam Thyroid: normal  Nodes: no obvious adenopathy  LUNGS Breath Sounds: Normal Percussion: Normal  CARDIOVASCULAR JVP CV wave: no HJR: no Elevation at 90 degrees: None Carotid Pulse: normal pulsation bilaterally Bruit: None Apex: apical impulse normal  Auscultation Rhythm: normal sinus rhythm S1: normal S2: normal Clicks: no Rub: no Murmurs: no murmurs  Gallop: None ABDOMEN Liver enlargement: no Pulsatile aorta: no Ascites: no Bruits: no  EXTREMITIES Clubbing: no Edema: trace to 1+ bilateral pedal edema Pulses: peripheral pulses symmetrical Femoral Bruits: no Amputation: no SKIN Rash: no Cyanosis: no Embolic phemonenon: no Bruising: no NEURO Alert and Oriented to person, place and time: yes Non focal: yes  PSYCH: Pt appears to have normal affect  LABS REVIEWED Last 3 CBC results: Lab Results  Component Value Date  WBC 5.1 09/07/2016  WBC 7.7 01/15/2016  WBC 4.5 08/26/2015   Lab Results  Component Value Date  HGB 14.3 09/07/2016  HGB 13.5 01/15/2016  HGB 13.5 08/26/2015   Lab Results  Component Value Date  HCT 42.6 09/07/2016  HCT 40.3 01/15/2016  HCT  41.0 08/26/2015   Lab Results  Component Value Date  PLT 227 09/07/2016  PLT 225 01/15/2016  PLT 204 08/26/2015   Lab Results  Component Value Date  CREATININE 0.9 09/07/2016  BUN 17 09/07/2016  NA 143 09/07/2016  K 4.7 09/07/2016  CL 104 09/07/2016  CO2 30.6 09/07/2016   Lab Results  Component Value Date  HDL 56.4 09/07/2016  HDL 53.2 02/13/2015   Lab Results  Component Value Date  LDLCALC 182 (H) 09/07/2016  LDLCALC 189 (H) 02/13/2015   Lab Results  Component Value Date  TRIG 128 09/07/2016  TRIG 141 02/13/2015   Lab Results  Component Value Date  ALT 13 09/07/2016  AST 18 09/07/2016  ALKPHOS 50 09/07/2016   Lab Results  Component Value Date  TSH 4.477 09/07/2016   Assessment and Plan   76 y.o. female with  ICD-10-CM ICD-9-CM  1. Hypertension, essential-blood pressure appears to be well controlled with diet alone. We will continue with this regimen and follow I10 401.9  2. Coronary artery disease involving native coronary artery of native heart without angina pectoris-now with more chest pain. No ischemia on electrocardiogram however given new symptoms will need to proceed with further evaluation. Functional study was unremarkable however her symptoms are worsening. Will need to evaluate her coronary anatomy with a left heart cath to evaluate for evidence of progression of disease. Risk and  benefits of left heart cath were explained the patient agrees to proceed. I25.10 414.01  3. Essential hypertension-we will continue with DASH diet. I10 401.9  4. Mixed hyperlipidemia-low-fat low-cholesterol diet with an LDL goal of less than 100. Current value is 182. Consideration for statin therapy will need to be discussed in the future. E78.2 272.2  5. Cerebrovascular accident (CVA), unspecified mechanism-no new neurologic deficits. Will continue with enteric-coated aspirin. I63.9 434.91  6. Palpitation-no significant arrhythmia. Patient symptoms have improved. Currently  stable with no significant arrhythmia. R00.2 785.1   Return in about 4 weeks (around 02/11/2017).  These notes generated with voice recognition software. I apologize for typographical errors.  Sydnee Levans, MD    Pt examined and H and P reviewed. No changes to above.

## 2017-01-19 NOTE — Progress Notes (Addendum)
ANTICOAGULATION CONSULT NOTE - Initial Consult  Pharmacy Consult for tirofiban  Allergies  Allergen Reactions  . Amoxicillin Hives  . Septra [Sulfamethoxazole-Trimethoprim] Hives    Patient Measurements: Height: 5\' 1"  (154.9 cm) Weight: 160 lb (72.6 kg) IBW/kg (Calculated) : 47.8  Vital Signs: Temp: 98.8 F (37.1 C) (01/24 1200) Temp Source: Oral (01/24 1200) BP: 129/77 (01/24 1430) Pulse Rate: 73 (01/24 1430)  Labs:  Recent Labs  01/19/17 1325  CREATININE 0.94    Estimated Creatinine Clearance: 45.7 mL/min (by C-G formula based on SCr of 0.94 mg/dL).   Medical History: Past Medical History:  Diagnosis Date  . Acid reflux   . Coronary artery disease   . Stroke [facility])     Assessment: 76 y/o F admited with [place] s/p DES to RCA to continue tirofiban x 18 hours.    Plan:  Continue tirofiban at 0.075 mcg/kg/min x 18 hours post-cath.   Ulice Dash D 01/19/2017,3:35 PM

## 2017-01-20 DIAGNOSIS — Z7982 Long term (current) use of aspirin: Secondary | ICD-10-CM | POA: Diagnosis not present

## 2017-01-20 DIAGNOSIS — I25110 Atherosclerotic heart disease of native coronary artery with unstable angina pectoris: Secondary | ICD-10-CM | POA: Diagnosis not present

## 2017-01-20 DIAGNOSIS — E782 Mixed hyperlipidemia: Secondary | ICD-10-CM | POA: Diagnosis not present

## 2017-01-20 DIAGNOSIS — I25719 Atherosclerosis of autologous vein coronary artery bypass graft(s) with unspecified angina pectoris: Secondary | ICD-10-CM | POA: Diagnosis not present

## 2017-01-20 DIAGNOSIS — I200 Unstable angina: Secondary | ICD-10-CM | POA: Diagnosis not present

## 2017-01-20 DIAGNOSIS — R002 Palpitations: Secondary | ICD-10-CM | POA: Diagnosis not present

## 2017-01-20 DIAGNOSIS — Z8673 Personal history of transient ischemic attack (TIA), and cerebral infarction without residual deficits: Secondary | ICD-10-CM | POA: Diagnosis not present

## 2017-01-20 DIAGNOSIS — I10 Essential (primary) hypertension: Secondary | ICD-10-CM | POA: Diagnosis not present

## 2017-01-20 LAB — BASIC METABOLIC PANEL
ANION GAP: 7 (ref 5–15)
BUN: 14 mg/dL (ref 6–20)
CALCIUM: 8.4 mg/dL — AB (ref 8.9–10.3)
CO2: 25 mmol/L (ref 22–32)
Chloride: 110 mmol/L (ref 101–111)
Creatinine, Ser: 0.93 mg/dL (ref 0.44–1.00)
GFR, EST NON AFRICAN AMERICAN: 58 mL/min — AB (ref >60)
Glucose, Bld: 114 mg/dL — ABNORMAL HIGH (ref 65–99)
Potassium: 3.7 mmol/L (ref 3.5–5.1)
SODIUM: 142 mmol/L (ref 135–145)

## 2017-01-20 LAB — POCT ACTIVATED CLOTTING TIME: Activated Clotting Time: 323 seconds

## 2017-01-20 LAB — CBC
HEMATOCRIT: 36.8 % (ref 35.0–47.0)
Hemoglobin: 12.9 g/dL (ref 12.0–16.0)
MCH: 33.7 pg (ref 26.0–34.0)
MCHC: 35 g/dL (ref 32.0–36.0)
MCV: 96.3 fL (ref 80.0–100.0)
PLATELETS: 190 10*3/uL (ref 150–440)
RBC: 3.82 MIL/uL (ref 3.80–5.20)
RDW: 13.7 % (ref 11.5–14.5)
WBC: 7 10*3/uL (ref 3.6–11.0)

## 2017-01-20 MED ORDER — OMEPRAZOLE 20 MG PO CPDR: 20.0000 mg | DELAYED_RELEASE_CAPSULE | Freq: Every day | ORAL | 11 refills | Status: DC

## 2017-01-20 MED ORDER — CLOPIDOGREL BISULFATE 75 MG PO TABS: 75.0000 mg | ORAL_TABLET | Freq: Every day | ORAL | 11 refills | Status: DC

## 2017-01-20 NOTE — Progress Notes (Signed)
Patient up in the chair, has ambulated in the unit x2. Also up to [facility]. Spouse at bedside has been updated, Dr Ubaldo Glassing at bedside and has assessed pt. No ss fo distress noted, pt ready for d/c.

## 2017-01-20 NOTE — Care Management Obs Status (Signed)
[place] NOTIFICATION   Patient Details  Name: Kristen Lloyd MRN: [id number] Date of Birth: 04-15-1941   Medicare Observation Status Notification Given:  No Less than 24 hours   Marshell Garfinkel, RN 01/20/2017, 9:47 AM

## 2017-01-20 NOTE — Discharge Summary (Signed)
Physician Discharge Summary  Patient ID: Kristen Lloyd MRN: [id number] DOB/AGE: 1940-12-28 76 y.o.  Admit date: 01/19/2017 Discharge date: 01/20/2017  Primary Discharge Diagnosis coronary artery disease Secondary Discharge Diagnosis unstable angina  Significant Diagnostic Studies: cardiac cath and pci of rca  Consults: None  Hospital Course: Pt presented as outpatient for cardiac cath due to unstable angina. Cath revealed patent lad stent with insignificant disease in the lad proximal to the stent. The left circumflex was normal. There was a 99% stenosis in the mid rca. Referred to Dr. Saralyn Pilar for pci of rca which was successfully carried out with a DES. Pt was placed on plavix after load and continued with asa. She did well post pci with aggrastat. No complications in the femoral cath site. No chest pain. Able to ambulate without difficulty. Renal function unchanged post contrast.    Discharge Exam: Blood pressure 132/76, pulse 82, temperature 98.5 F (36.9 C), temperature source Oral, resp. rate 18, height 5\' 1"  (1.549 m), weight 160 lb (72.6 kg), SpO2 94 %.   General appearance: alert and cooperative Resp: clear to auscultation bilaterally Cardio: regular rate and rhythm, S1, S2 normal, no murmur, click, rub or gallop GI: soft, non-tender; bowel sounds normal; no masses,  no organomegaly Extremities: extremities normal, atraumatic, no cyanosis or edema Pulses: 2+ and symmetric Neurologic: Grossly normal Labs:   Lab Results  Component Value Date   WBC 7.0 01/20/2017   HGB 12.9 01/20/2017   HCT 36.8 01/20/2017   MCV 96.3 01/20/2017   PLT 190 01/20/2017    Recent Labs Lab 01/20/17 0328  NA 142  K 3.7  CL 110  CO2 25  BUN 14  CREATININE 0.93  CALCIUM 8.4*  GLUCOSE 114*       EKG: nsr with no ischemia  FOLLOW UP PLANS AND APPOINTMENTS  Allergies as of 01/20/2017      Reactions   Amoxicillin Hives   Septra [sulfamethoxazole-trimethoprim] Hives       Medication List    TAKE these medications   alum hydroxide-mag trisilicate [id number] MG Chew chewable tablet Commonly known as:  GAVISCON Chew 1 tablet by mouth 5 (five) times daily as needed for indigestion or heartburn.   aspirin 81 MG tablet Take 81 mg by mouth daily.   cholecalciferol 1000 units tablet Commonly known as:  VITAMIN D Take 1,000 Units by mouth daily.   clopidogrel 75 MG tablet Commonly known as:  PLAVIX Take 1 tablet (75 mg total) by mouth daily with breakfast.   fenofibrate 160 MG tablet Take 80 mg by mouth daily.   fluticasone 50 MCG/ACT nasal spray Commonly known as:  FLONASE Place 1 spray into both nostrils daily.   MYLANTA 200-200-20 MG/5ML suspension Generic drug:  alum & mag hydroxide-simeth Take 30 mLs by mouth every 6 (six) hours as needed for indigestion or heartburn.   omeprazole 20 MG capsule Commonly known as:  PRILOSEC Take 1 capsule (20 mg total) by mouth daily. Take 12 hours apart from plavix What changed:  when to take this  reasons to take this  additional instructions   sodium chloride 0.65 % Soln nasal spray Commonly known as:  OCEAN Place 2 sprays into both nostrils every 2 (two) hours while awake. What changed:  when to take this  reasons to take this   traMADol 50 MG tablet Commonly known as:  ULTRAM Take 25 mg by mouth every 6 (six) hours as needed for moderate pain.      Follow-up Information  Teodoro Spray, MD Follow up in 1 week(s).   Specialty:  Cardiology Contact information: [place] [address] [phone]           BRING ALL MEDICATIONS WITH YOU TO FOLLOW UP APPOINTMENTS  Time spent with patient to include physician time: 30 Signed:  Teodoro Spray MD, [facility] 01/20/2017, 7:35 AM

## 2017-01-20 NOTE — Progress Notes (Addendum)
D/C instructions discussed with pt before d/c. IVs removed, no concerns at this time. Pt picked up by volunteer, given Rx card and d/c papers, spouse present at time of d/c. No distress noted.

## 2017-01-20 NOTE — Final Progress Note (Signed)
Doing well post pci. OK for discharge

## 2017-01-20 NOTE — Progress Notes (Signed)
Discussed statin therapy with the patient. She defers taking a statin due to intolerance in the past.

## 2017-01-26 DIAGNOSIS — I2510 Atherosclerotic heart disease of native coronary artery without angina pectoris: Secondary | ICD-10-CM | POA: Diagnosis not present

## 2017-01-26 DIAGNOSIS — I10 Essential (primary) hypertension: Secondary | ICD-10-CM | POA: Diagnosis not present

## 2017-01-26 DIAGNOSIS — G459 Transient cerebral ischemic attack, unspecified: Secondary | ICD-10-CM | POA: Diagnosis not present

## 2017-01-26 DIAGNOSIS — Z955 Presence of coronary angioplasty implant and graft: Secondary | ICD-10-CM | POA: Diagnosis not present

## 2017-01-26 DIAGNOSIS — E7800 Pure hypercholesterolemia, unspecified: Secondary | ICD-10-CM | POA: Diagnosis not present

## 2017-01-26 DIAGNOSIS — I639 Cerebral infarction, unspecified: Secondary | ICD-10-CM | POA: Diagnosis not present

## 2017-02-08 ENCOUNTER — Encounter: Payer: Medicare Other | Attending: Cardiology | Admitting: *Deleted

## 2017-02-08 VITALS — Ht 61.1 in | Wt 149.0 lb

## 2017-02-08 DIAGNOSIS — Z9861 Coronary angioplasty status: Secondary | ICD-10-CM | POA: Diagnosis not present

## 2017-02-08 NOTE — Progress Notes (Signed)
Daily Session Note  Patient Details  Name: Kristen Lloyd MRN: [id number] Date of Birth: [birth year] Referring Provider:    Encounter Date: 02/08/2017  Check In:     Session Check In - 02/08/17 1416      Check-In   Location [facility]   Staff Present Nyoka Cowden, RN, BSN, MA   Supervising physician immediately available to respond to emergencies See telemetry face sheet for immediately available ER MD   Medication changes reported     No   Fall or balance concerns reported    No   Warm-up and Cool-down Performed as group-led instruction   Resistance Training Performed Yes   VAD Patient? No     Pain Assessment   Currently in Pain? No/denies         Goals Met:  Exercise tolerated well Personal goals reviewed  Goals Unmet:  Not Applicable  Comments:     Dr. Emily Filbert is Medical Director for [place] and [facility].

## 2017-02-08 NOTE — Progress Notes (Signed)
Cardiac Individual Treatment Plan  Patient Details  Name: Kristen Lloyd MRN: [id number] Date of Birth: 03-15-1941 Referring Provider:   Flowsheet Row Cardiac Rehab from 02/08/2017 in [facility] Cardiac and Pulmonary Rehab  Referring Provider  Bartholome Bill MD      Initial Encounter Date:  Flowsheet Row Cardiac Rehab from 02/08/2017 in [facility] Cardiac and Pulmonary Rehab  Date  02/08/17  Referring Provider  Bartholome Bill MD      Visit Diagnosis: S/P PTCA (percutaneous transluminal coronary angioplasty)  Patient's Home Medications on Admission:  Current Outpatient Prescriptions:  .  alum & mag hydroxide-simeth (MYLANTA) 200-200-20 MG/5ML suspension, Take 30 mLs by mouth every 6 (six) hours as needed for indigestion or heartburn., Disp: , Rfl:  .  alum hydroxide-mag trisilicate (GAVISCON) [id number] MG CHEW chewable tablet, Chew 1 tablet by mouth 5 (five) times daily as needed for indigestion or heartburn., Disp: , Rfl:  .  aspirin 81 MG tablet, Take 81 mg by mouth daily., Disp: , Rfl:  .  cholecalciferol (VITAMIN D) 1000 UNITS tablet, Take 1,000 Units by mouth daily., Disp: , Rfl:  .  clopidogrel (PLAVIX) 75 MG tablet, Take 1 tablet (75 mg total) by mouth daily with breakfast., Disp: 30 tablet, Rfl: 11 .  fenofibrate 160 MG tablet, Take 80 mg by mouth daily., Disp: , Rfl:  .  fluticasone (FLONASE) 50 MCG/ACT nasal spray, Place 1 spray into both nostrils daily., Disp: , Rfl:  .  omeprazole (PRILOSEC) 20 MG capsule, Take 1 capsule (20 mg total) by mouth daily. Take 12 hours apart from plavix, Disp: 30 capsule, Rfl: 11 .  sodium chloride (OCEAN) 0.65 % SOLN nasal spray, Place 2 sprays into both nostrils every 2 (two) hours while awake. (Patient taking differently: Place 2 sprays into both nostrils 2 (two) times daily as needed for congestion. ), Disp: , Rfl: 0 .  traMADol (ULTRAM) 50 MG tablet, Take 25 mg by mouth every 6 (six) hours as needed for moderate pain., Disp: , Rfl:  No current  facility-administered medications for this visit.   Facility-Administered Medications Ordered in Other Visits:  .  0.9 %  sodium chloride infusion, 250 mL, Intravenous, PRN, Teodoro Spray, MD .  [EXPIRED] 0.9% sodium chloride infusion, 3 mL/kg/hr, Intravenous, Continuous **FOLLOWED BY** 0.9% sodium chloride infusion, 1 mL/kg/hr, Intravenous, Continuous, Teodoro Spray, MD .  sodium chloride flush (NS) 0.9 % injection 3 mL, 3 mL, Intravenous, Q12H, Teodoro Spray, MD .  sodium chloride flush (NS) 0.9 % injection 3 mL, 3 mL, Intravenous, PRN, Teodoro Spray, MD  Past Medical History: Past Medical History:  Diagnosis Date  . Acid reflux   . Coronary artery disease   . Stroke [facility])     Tobacco Use: History  Smoking Status  . Never Smoker  Smokeless Tobacco  . Never Used    Labs: Recent Review Flowsheet Data    There is no flowsheet data to display.       Exercise Target Goals: Date: 02/08/17  Exercise Program Goal: Individual exercise prescription set with THRR, safety & activity barriers. Participant demonstrates ability to understand and report RPE using BORG scale, to self-measure pulse accurately, and to acknowledge the importance of the exercise prescription.  Exercise Prescription Goal: Starting with aerobic activity 30 plus minutes a day, 3 days per week for initial exercise prescription. Provide home exercise prescription and guidelines that participant acknowledges understanding prior to discharge.  Activity Barriers & Risk Stratification:     Activity Barriers &  Cardiac Risk Stratification - 02/08/17 1414      Activity Barriers & Cardiac Risk Stratification   Activity Barriers Joint Problems;Deconditioning;Muscular Weakness  chronic knee pain   Cardiac Risk Stratification Moderate      6 Minute Walk:     6 Minute Walk    Row Name 02/08/17 1440         6 Minute Walk   Phase Initial     Distance 1200 feet     Walk Time 6 minutes     MPH 2.27      METS 2.68     RPE 13     VO2 Peak 7.35     Symptoms Yes (comment)     Comments chronic knee pain 6/10, generalized leg weakness     Resting HR 64 bpm     Resting BP 130/70     Max Ex. HR 92 bpm     Max Ex. BP 128/70        Initial Exercise Prescription:     Initial Exercise Prescription - 02/08/17 1400      Date of Initial Exercise RX and Referring Provider   Date 02/08/17   Referring Provider Bartholome Bill MD     Treadmill   MPH 2   Grade 0.5   Minutes 15   METs 2.67     NuStep   Level 3   Minutes 15   METs 2     Elliptical   Level 1   Speed 3   Minutes 15     Prescription Details   Frequency (times per week) 3   Duration Progress to 45 minutes of aerobic exercise without signs/symptoms of physical distress     Intensity   THRR 40-80% of Max Heartrate 96-127   Ratings of Perceived Exertion 11-13   Perceived Dyspnea 0-4     Progression   Progression Continue to progress workloads to maintain intensity without signs/symptoms of physical distress.     Resistance Training   Training Prescription Yes   Weight 3 lbs   Reps 10-15      Perform Capillary Blood Glucose checks as needed.  Exercise Prescription Changes:      Exercise Prescription Changes    Row Name 02/08/17 1400             Exercise Review   Progression -  walk test results         Response to Exercise   Blood Pressure (Admit) 130/70       Blood Pressure (Exercise) 128/70       Heart Rate (Admit) 64 bpm       Heart Rate (Exercise) 92 bpm       Oxygen Saturation (Admit) 98 %       Oxygen Saturation (Exercise) 100 %       Rating of Perceived Exertion (Exercise) 13       Symptoms knee pain 6/10, generalized leg weakness          Exercise Comments:      Exercise Comments    Row Name 02/08/17 1443           Exercise Comments Gillianne is only planning to do two weeks and then return to the [employer] program          Discharge Exercise Prescription (Final Exercise  Prescription Changes):     Exercise Prescription Changes - 02/08/17 1400      Exercise Review   Progression --  walk test results  Response to Exercise   Blood Pressure (Admit) 130/70   Blood Pressure (Exercise) 128/70   Heart Rate (Admit) 64 bpm   Heart Rate (Exercise) 92 bpm   Oxygen Saturation (Admit) 98 %   Oxygen Saturation (Exercise) 100 %   Rating of Perceived Exertion (Exercise) 13   Symptoms knee pain 6/10, generalized leg weakness      Nutrition:  Target Goals: Understanding of nutrition guidelines, daily intake of sodium 1500mg , cholesterol 200mg , calories 30% from fat and 7% or less from saturated fats, daily to have 5 or more servings of fruits and vegetables.  Biometrics:     Pre Biometrics - 02/08/17 1443      Pre Biometrics   Height 5' 1.1" (1.552 m)   Weight 149 lb (67.6 kg)   Waist Circumference 36 inches   Hip Circumference 42 inches   Waist to Hip Ratio 0.86 %   BMI (Calculated) 28.1   Single Leg Stand 30 seconds       Nutrition Therapy Plan and Nutrition Goals:     Nutrition Therapy & Goals - 02/08/17 1421      Personal Nutrition Goals   Comments Low Salt diet     Intervention Plan   Intervention Prescribe, educate and counsel regarding individualized specific dietary modifications aiming towards targeted core components such as weight, hypertension, lipid management, diabetes, heart failure and other comorbidities.;Nutrition handout(s) given to patient.   Expected Outcomes Short Term Goal: Understand basic principles of dietary content, such as calories, fat, sodium, cholesterol and nutrients.;Short Term Goal: A plan has been developed with personal nutrition goals set during dietitian appointment.;Long Term Goal: Adherence to prescribed nutrition plan.      Nutrition Discharge: Rate Your Plate Scores:   Nutrition Goals Re-Evaluation:   Psychosocial: Target Goals: Acknowledge presence or absence of depression, maximize coping  skills, provide positive support system. Participant is able to verbalize types and ability to use techniques and skills needed for reducing stress and depression.  Initial Review & Psychosocial Screening:     Initial Psych Review & Screening - 02/08/17 1422      Initial Review   Current issues with Current Sleep Concerns     Family Dynamics   Good Support System? Yes   Comments Wiletta has been married for over 30 years. He is very supportive.      Barriers   Psychosocial barriers to participate in program There are no identifiable barriers or psychosocial needs.     Screening Interventions   Interventions Encouraged to exercise      Quality of Life Scores:     Quality of Life - 02/08/17 1423      Quality of Life Scores   Health/Function Pre --  Cordell prefered not to do this form since she said she was in Cardiac Rehab before.       PHQ-9: Recent Review Flowsheet Data    There is no flowsheet data to display.      Psychosocial Evaluation and Intervention:   Psychosocial Re-Evaluation:   Vocational Rehabilitation: Provide vocational rehab assistance to qualifying candidates.   Vocational Rehab Evaluation & Intervention:     Vocational Rehab - 02/08/17 1416      Initial Vocational Rehab Evaluation & Intervention   Assessment shows need for Vocational Rehabilitation No      Education: Education Goals: Education classes will be provided on a weekly basis, covering required topics. Participant will state understanding/return demonstration of topics presented.  Learning Barriers/Preferences:     Learning  Barriers/Preferences - 02/08/17 1415      Learning Barriers/Preferences   Learning Barriers None   Learning Preferences Group Instruction      Education Topics: General Nutrition Guidelines/Fats and Fiber: -Group instruction provided by verbal, written material, models and posters to present the general guidelines for heart healthy nutrition. Gives  an explanation and review of dietary fats and fiber.   Controlling Sodium/Reading Food Labels: -Group verbal and written material supporting the discussion of sodium use in heart healthy nutrition. Review and explanation with models, verbal and written materials for utilization of the food label.   Exercise Physiology & Risk Factors: - Group verbal and written instruction with models to review the exercise physiology of the cardiovascular system and associated critical values. Details cardiovascular disease risk factors and the goals associated with each risk factor.   Aerobic Exercise & Resistance Training: - Gives group verbal and written discussion on the health impact of inactivity. On the components of aerobic and resistive training programs and the benefits of this training and how to safely progress through these programs.   Flexibility, Balance, General Exercise Guidelines: - Provides group verbal and written instruction on the benefits of flexibility and balance training programs. Provides general exercise guidelines with specific guidelines to those with heart or lung disease. Demonstration and skill practice provided.   Stress Management: - Provides group verbal and written instruction about the health risks of elevated stress, cause of high stress, and healthy ways to reduce stress.   Depression: - Provides group verbal and written instruction on the correlation between heart/lung disease and depressed mood, treatment options, and the stigmas associated with seeking treatment.   Anatomy & Physiology of the Heart: - Group verbal and written instruction and models provide basic cardiac anatomy and physiology, with the coronary electrical and arterial systems. Review of: AMI, Angina, Valve disease, Heart Failure, Cardiac Arrhythmia, Pacemakers, and the ICD.   Cardiac Procedures: - Group verbal and written instruction and models to describe the testing methods done to diagnose  heart disease. Reviews the outcomes of the test results. Describes the treatment choices: Medical Management, Angioplasty, or Coronary Bypass Surgery.   Cardiac Medications: - Group verbal and written instruction to review commonly prescribed medications for heart disease. Reviews the medication, class of the drug, and side effects. Includes the steps to properly store meds and maintain the prescription regimen.   Go Sex-Intimacy & Heart Disease, Get SMART - Goal Setting: - Group verbal and written instruction through game format to discuss heart disease and the return to sexual intimacy. Provides group verbal and written material to discuss and apply goal setting through the application of the S.M.A.R.T. Method.   Other Matters of the Heart: - Provides group verbal, written materials and models to describe Heart Failure, Angina, Valve Disease, and Diabetes in the realm of heart disease. Includes description of the disease process and treatment options available to the cardiac patient.   Exercise & Equipment Safety: - Individual verbal instruction and demonstration of equipment use and safety with use of the equipment. Flowsheet Row Cardiac Rehab from 02/08/2017 in [facility] Cardiac and Pulmonary Rehab  Date  02/08/17  Educator  C. Jahmal Dunavant, RN  Instruction Review Code  1- partially meets, needs review/practice      Infection Prevention: - Provides verbal and written material to individual with discussion of infection control including proper hand washing and proper equipment cleaning during exercise session. Flowsheet Row Cardiac Rehab from 02/08/2017 in [facility] Cardiac and Pulmonary Rehab  Date  02/08/17  Educator  C. EnterkinRN  Instruction Review Code  2- meets goals/outcomes      Falls Prevention: - Provides verbal and written material to individual with discussion of falls prevention and safety. Flowsheet Row Cardiac Rehab from 02/08/2017 in [facility] Cardiac and Pulmonary Rehab  Date   02/08/17  Educator  C. [place]  Instruction Review Code  2- meets goals/outcomes      Diabetes: - Individual verbal and written instruction to review signs/symptoms of diabetes, desired ranges of glucose level fasting, after meals and with exercise. Advice that pre and post exercise glucose checks will be done for 3 sessions at entry of program.    Knowledge Questionnaire Score:     Knowledge Questionnaire Score - 02/08/17 1415      Knowledge Questionnaire Score   Pre Score --  Nelani did not want to do this extra paperwork since she was in Cardiac Rehab before.       Core Components/Risk Factors/Patient Goals at Admission:     Personal Goals and Risk Factors at Admission - 02/08/17 1422      Core Components/Risk Factors/Patient Goals on Admission    Weight Management Yes;Weight Loss   Intervention Weight Management: Develop a combined nutrition and exercise program designed to reach desired caloric intake, while maintaining appropriate intake of nutrient and fiber, sodium and fats, and appropriate energy expenditure required for the weight goal.;Weight Management: Provide education and appropriate resources to help participant work on and attain dietary goals.   Admit Weight 149 lb (67.6 kg)   Goal Weight: Short Term 145 lb (65.8 kg)   Goal Weight: Long Term 140 lb (63.5 kg)   Expected Outcomes Short Term: Continue to assess and modify interventions until short term weight is achieved;Long Term: Adherence to nutrition and physical activity/exercise program aimed toward attainment of established weight goal;Weight Loss: Understanding of general recommendations for a balanced deficit meal plan, which promotes 1-2 lb weight loss per week and includes a negative energy balance of [phone] kcal/d;Understanding recommendations for meals to include 15-35% energy as protein, 25-35% energy from fat, 35-60% energy from carbohydrates, less than 200mg  of dietary cholesterol, 20-35 gm of total  fiber daily;Understanding of distribution of calorie intake throughout the day with the consumption of 4-5 meals/snacks   Increase Strength and Stamina Yes   Intervention Provide advice, education, support and counseling about physical activity/exercise needs.;Develop an individualized exercise prescription for aerobic and resistive training based on initial evaluation findings, risk stratification, comorbidities and participant's personal goals.   Expected Outcomes Achievement of increased cardiorespiratory fitness and enhanced flexibility, muscular endurance and strength shown through measurements of functional capacity and personal statement of participant.   Lipids Yes   Intervention Provide education and support for participant on nutrition & aerobic/resistive exercise along with prescribed medications to achieve LDL 70mg , HDL >40mg .   Expected Outcomes Short Term: Participant states understanding of desired cholesterol values and is compliant with medications prescribed. Participant is following exercise prescription and nutrition guidelines.;Long Term: Cholesterol controlled with medications as prescribed, with individualized exercise RX and with personalized nutrition plan. Value goals: LDL < 70mg , HDL > 40 mg.      Core Components/Risk Factors/Patient Goals Review:    Core Components/Risk Factors/Patient Goals at Discharge (Final Review):    ITP Comments:     ITP Comments    Row Name 02/08/17 1421           ITP Comments Kieren did not want to do this extra paperwork since she was in  Cardiac Rehab before.           Comments: Ready to start Cardiac Rehab for 2 weeks since she wants to return to [employer] which meets In the same gym.

## 2017-02-08 NOTE — Patient Instructions (Signed)
Patient Instructions  Patient Details  Name: Kristen Lloyd MRN: [id number] Date of Birth: 11/02/1941 Referring Provider:  Teodoro Spray, MD  Below are the personal goals you chose as well as exercise and nutrition goals. Our goal is to help you keep on track towards obtaining and maintaining your goals. We will be discussing your progress on these goals with you throughout the program.  Initial Exercise Prescription:     Initial Exercise Prescription - 02/08/17 1400      Date of Initial Exercise RX and Referring Provider   Date 02/08/17   Referring Provider Bartholome Bill MD     Treadmill   MPH 2   Grade 0.5   Minutes 15   METs 2.67     NuStep   Level 3   Minutes 15   METs 2     Elliptical   Level 1   Speed 3   Minutes 15     Prescription Details   Frequency (times per week) 3   Duration Progress to 45 minutes of aerobic exercise without signs/symptoms of physical distress     Intensity   THRR 40-80% of Max Heartrate 96-127   Ratings of Perceived Exertion 11-13   Perceived Dyspnea 0-4     Progression   Progression Continue to progress workloads to maintain intensity without signs/symptoms of physical distress.     Resistance Training   Training Prescription Yes   Weight 3 lbs   Reps 10-15      Exercise Goals: Frequency: Be able to perform aerobic exercise three times per week working toward 3-5 days per week.  Intensity: Work with a perceived exertion of 11 (fairly light) - 15 (hard) as tolerated. Follow your new exercise prescription and watch for changes in prescription as you progress with the program. Changes will be reviewed with you when they are made.  Duration: You should be able to do 30 minutes of continuous aerobic exercise in addition to a 5 minute warm-up and a 5 minute cool-down routine.  Nutrition Goals: Your personal nutrition goals will be established when you do your nutrition analysis with the dietician.  The following are nutrition  guidelines to follow: Cholesterol < 200mg /day Sodium < 1500mg /day Fiber: Women over 50 yrs - 21 grams per day  Personal Goals:     Personal Goals and Risk Factors at Admission - 02/08/17 1422      Core Components/Risk Factors/Patient Goals on Admission    Weight Management Yes;Weight Loss   Intervention Weight Management: Develop a combined nutrition and exercise program designed to reach desired caloric intake, while maintaining appropriate intake of nutrient and fiber, sodium and fats, and appropriate energy expenditure required for the weight goal.;Weight Management: Provide education and appropriate resources to help participant work on and attain dietary goals.   Admit Weight 149 lb (67.6 kg)   Goal Weight: Short Term 145 lb (65.8 kg)   Goal Weight: Long Term 140 lb (63.5 kg)   Expected Outcomes Short Term: Continue to assess and modify interventions until short term weight is achieved;Long Term: Adherence to nutrition and physical activity/exercise program aimed toward attainment of established weight goal;Weight Loss: Understanding of general recommendations for a balanced deficit meal plan, which promotes 1-2 lb weight loss per week and includes a negative energy balance of [phone] kcal/d;Understanding recommendations for meals to include 15-35% energy as protein, 25-35% energy from fat, 35-60% energy from carbohydrates, less than 200mg  of dietary cholesterol, 20-35 gm of total fiber daily;Understanding of distribution of  calorie intake throughout the day with the consumption of 4-5 meals/snacks   Increase Strength and Stamina Yes   Intervention Provide advice, education, support and counseling about physical activity/exercise needs.;Develop an individualized exercise prescription for aerobic and resistive training based on initial evaluation findings, risk stratification, comorbidities and participant's personal goals.   Expected Outcomes Achievement of increased cardiorespiratory  fitness and enhanced flexibility, muscular endurance and strength shown through measurements of functional capacity and personal statement of participant.   Lipids Yes   Intervention Provide education and support for participant on nutrition & aerobic/resistive exercise along with prescribed medications to achieve LDL 70mg , HDL >40mg .   Expected Outcomes Short Term: Participant states understanding of desired cholesterol values and is compliant with medications prescribed. Participant is following exercise prescription and nutrition guidelines.;Long Term: Cholesterol controlled with medications as prescribed, with individualized exercise RX and with personalized nutrition plan. Value goals: LDL < 70mg , HDL > 40 mg.      Tobacco Use Initial Evaluation: History  Smoking Status  . Never Smoker  Smokeless Tobacco  . Never Used    Copy of goals given to participant.

## 2017-02-09 ENCOUNTER — Ambulatory Visit: Payer: Medicare Other

## 2017-02-10 ENCOUNTER — Ambulatory Visit: Payer: Medicare Other

## 2017-02-14 ENCOUNTER — Ambulatory Visit: Payer: Medicare Other

## 2017-02-16 ENCOUNTER — Ambulatory Visit: Payer: Medicare Other

## 2017-02-17 ENCOUNTER — Ambulatory Visit: Payer: Medicare Other

## 2017-02-21 ENCOUNTER — Ambulatory Visit: Payer: Medicare Other

## 2017-02-23 ENCOUNTER — Encounter: Payer: Self-pay | Admitting: *Deleted

## 2017-02-23 DIAGNOSIS — Z9861 Coronary angioplasty status: Secondary | ICD-10-CM

## 2017-02-23 NOTE — Progress Notes (Signed)
Cardiac Individual Treatment Plan  Patient Details  Name: Kristen Lloyd MRN: [id number] Date of Birth: 1941-02-11 Referring Provider:   Flowsheet Row Cardiac Rehab from 02/08/2017 in [facility] Cardiac and Pulmonary Rehab  Referring Provider  Bartholome Bill MD      Initial Encounter Date:  Flowsheet Row Cardiac Rehab from 02/08/2017 in [facility] Cardiac and Pulmonary Rehab  Date  02/08/17  Referring Provider  Bartholome Bill MD      Visit Diagnosis: S/P PTCA (percutaneous transluminal coronary angioplasty)  Patient's Home Medications on Admission:  Current Outpatient Prescriptions:  .  alum & mag hydroxide-simeth (MYLANTA) 200-200-20 MG/5ML suspension, Take 30 mLs by mouth every 6 (six) hours as needed for indigestion or heartburn., Disp: , Rfl:  .  alum hydroxide-mag trisilicate (GAVISCON) [id number] MG CHEW chewable tablet, Chew 1 tablet by mouth 5 (five) times daily as needed for indigestion or heartburn., Disp: , Rfl:  .  aspirin 81 MG tablet, Take 81 mg by mouth daily., Disp: , Rfl:  .  cholecalciferol (VITAMIN D) 1000 UNITS tablet, Take 1,000 Units by mouth daily., Disp: , Rfl:  .  clopidogrel (PLAVIX) 75 MG tablet, Take 1 tablet (75 mg total) by mouth daily with breakfast., Disp: 30 tablet, Rfl: 11 .  fenofibrate 160 MG tablet, Take 80 mg by mouth daily., Disp: , Rfl:  .  fluticasone (FLONASE) 50 MCG/ACT nasal spray, Place 1 spray into both nostrils daily., Disp: , Rfl:  .  omeprazole (PRILOSEC) 20 MG capsule, Take 1 capsule (20 mg total) by mouth daily. Take 12 hours apart from plavix, Disp: 30 capsule, Rfl: 11 .  sodium chloride (OCEAN) 0.65 % SOLN nasal spray, Place 2 sprays into both nostrils every 2 (two) hours while awake. (Patient taking differently: Place 2 sprays into both nostrils 2 (two) times daily as needed for congestion. ), Disp: , Rfl: 0 .  traMADol (ULTRAM) 50 MG tablet, Take 25 mg by mouth every 6 (six) hours as needed for moderate pain., Disp: , Rfl:  No current  facility-administered medications for this visit.   Facility-Administered Medications Ordered in Other Visits:  .  0.9 %  sodium chloride infusion, 250 mL, Intravenous, PRN, Teodoro Spray, MD .  [EXPIRED] 0.9% sodium chloride infusion, 3 mL/kg/hr, Intravenous, Continuous **FOLLOWED BY** 0.9% sodium chloride infusion, 1 mL/kg/hr, Intravenous, Continuous, Teodoro Spray, MD .  sodium chloride flush (NS) 0.9 % injection 3 mL, 3 mL, Intravenous, Q12H, Teodoro Spray, MD .  sodium chloride flush (NS) 0.9 % injection 3 mL, 3 mL, Intravenous, PRN, Teodoro Spray, MD  Past Medical History: Past Medical History:  Diagnosis Date  . Acid reflux   . Coronary artery disease   . Stroke [facility])     Tobacco Use: History  Smoking Status  . Never Smoker  Smokeless Tobacco  . Never Used    Labs: Recent Review Flowsheet Data    There is no flowsheet data to display.       Exercise Target Goals:    Exercise Program Goal: Individual exercise prescription set with THRR, safety & activity barriers. Participant demonstrates ability to understand and report RPE using BORG scale, to self-measure pulse accurately, and to acknowledge the importance of the exercise prescription.  Exercise Prescription Goal: Starting with aerobic activity 30 plus minutes a day, 3 days per week for initial exercise prescription. Provide home exercise prescription and guidelines that participant acknowledges understanding prior to discharge.  Activity Barriers & Risk Stratification:     Activity Barriers &  Cardiac Risk Stratification - 02/08/17 1414      Activity Barriers & Cardiac Risk Stratification   Activity Barriers Joint Problems;Deconditioning;Muscular Weakness  chronic knee pain   Cardiac Risk Stratification Moderate      6 Minute Walk:     6 Minute Walk    Row Name 02/08/17 1440         6 Minute Walk   Phase Initial     Distance 1200 feet     Walk Time 6 minutes     MPH 2.27     METS 2.68      RPE 13     VO2 Peak 7.35     Symptoms Yes (comment)     Comments chronic knee pain 6/10, generalized leg weakness     Resting HR 64 bpm     Resting BP 130/70     Max Ex. HR 92 bpm     Max Ex. BP 128/70        Oxygen Initial Assessment:   Oxygen Re-Evaluation:   Oxygen Discharge (Final Oxygen Re-Evaluation):   Initial Exercise Prescription:     Initial Exercise Prescription - 02/08/17 1400      Date of Initial Exercise RX and Referring Provider   Date 02/08/17   Referring Provider Bartholome Bill MD     Treadmill   MPH 2   Grade 0.5   Minutes 15   METs 2.67     NuStep   Level 3   Minutes 15   METs 2     Elliptical   Level 1   Speed 3   Minutes 15     Prescription Details   Frequency (times per week) 3   Duration Progress to 45 minutes of aerobic exercise without signs/symptoms of physical distress     Intensity   THRR 40-80% of Max Heartrate 96-127   Ratings of Perceived Exertion 11-13   Perceived Dyspnea 0-4     Progression   Progression Continue to progress workloads to maintain intensity without signs/symptoms of physical distress.     Resistance Training   Training Prescription Yes   Weight 3 lbs   Reps 10-15      Perform Capillary Blood Glucose checks as needed.  Exercise Prescription Changes:     Exercise Prescription Changes    Row Name 02/08/17 1400             Response to Exercise   Blood Pressure (Admit) 130/70       Blood Pressure (Exercise) 128/70       Heart Rate (Admit) 64 bpm       Heart Rate (Exercise) 92 bpm       Oxygen Saturation (Admit) 98 %       Oxygen Saturation (Exercise) 100 %       Rating of Perceived Exertion (Exercise) 13       Symptoms knee pain 6/10, generalized leg weakness         Exercise Review   Progression -  walk test results          Exercise Comments:     Exercise Comments    Row Name 02/08/17 1443           Exercise Comments Kristen Lloyd is only planning to do two weeks and then return  to the [employer]          Exercise Goals and Review:   Exercise Goals Re-Evaluation :   Discharge Exercise Prescription (Final Exercise Prescription Changes):  Exercise Prescription Changes - 02/08/17 1400      Response to Exercise   Blood Pressure (Admit) 130/70   Blood Pressure (Exercise) 128/70   Heart Rate (Admit) 64 bpm   Heart Rate (Exercise) 92 bpm   Oxygen Saturation (Admit) 98 %   Oxygen Saturation (Exercise) 100 %   Rating of Perceived Exertion (Exercise) 13   Symptoms knee pain 6/10, generalized leg weakness     Exercise Review   Progression --  walk test results      Nutrition:  Target Goals: Understanding of nutrition guidelines, daily intake of sodium 1500mg , cholesterol 200mg , calories 30% from fat and 7% or less from saturated fats, daily to have 5 or more servings of fruits and vegetables.  Biometrics:     Pre Biometrics - 02/08/17 1443      Pre Biometrics   Height 5' 1.1" (1.552 m)   Weight 149 lb (67.6 kg)   Waist Circumference 36 inches   Hip Circumference 42 inches   Waist to Hip Ratio 0.86 %   BMI (Calculated) 28.1   Single Leg Stand 30 seconds       Nutrition Therapy Plan and Nutrition Goals:     Nutrition Therapy & Goals - 02/08/17 1421      Personal Nutrition Goals   Comments Low Salt diet     Intervention Plan   Intervention Prescribe, educate and counsel regarding individualized specific dietary modifications aiming towards targeted core components such as weight, hypertension, lipid management, diabetes, heart failure and other comorbidities.;Nutrition handout(s) given to patient.   Expected Outcomes Short Term Goal: Understand basic principles of dietary content, such as calories, fat, sodium, cholesterol and nutrients.;Short Term Goal: A plan has been developed with personal nutrition goals set during dietitian appointment.;Long Term Goal: Adherence to prescribed nutrition plan.      Nutrition Discharge:  Rate Your Plate Scores:   Nutrition Goals Re-Evaluation:   Nutrition Goals Discharge (Final Nutrition Goals Re-Evaluation):   Psychosocial: Target Goals: Acknowledge presence or absence of significant depression and/or stress, maximize coping skills, provide positive support system. Participant is able to verbalize types and ability to use techniques and skills needed for reducing stress and depression.   Initial Review & Psychosocial Screening:     Initial Psych Review & Screening - 02/08/17 1422      Initial Review   Current issues with Current Sleep Concerns     Family Dynamics   Good Support System? Yes   Comments Kristen Lloyd has been married for over 30 years. He is very supportive.      Barriers   Psychosocial barriers to participate in program There are no identifiable barriers or psychosocial needs.     Screening Interventions   Interventions Encouraged to exercise      Quality of Life Scores:      Quality of Life - 02/08/17 1423      Quality of Life Scores   Health/Function Pre --  Kristen Lloyd prefered not to do this form since she said she was in Cardiac Rehab before.       PHQ-9: Recent Review Flowsheet Data    There is no flowsheet data to display.     Interpretation of Total Score  Total Score Depression Severity:  1-4 = Minimal depression, 5-9 = Mild depression, 10-14 = Moderate depression, 15-19 = Moderately severe depression, 20-27 = Severe depression   Psychosocial Evaluation and Intervention:   Psychosocial Re-Evaluation:   Psychosocial Discharge (Final Psychosocial Re-Evaluation):   Vocational Rehabilitation: Provide  vocational rehab assistance to qualifying candidates.   Vocational Rehab Evaluation & Intervention:     Vocational Rehab - 02/08/17 1416      Initial Vocational Rehab Evaluation & Intervention   Assessment shows need for Vocational Rehabilitation No      Education: Education Goals: Education classes will be provided on a  weekly basis, covering required topics. Participant will state understanding/return demonstration of topics presented.  Learning Barriers/Preferences:     Learning Barriers/Preferences - 02/08/17 1415      Learning Barriers/Preferences   Learning Barriers None   Learning Preferences Group Instruction      Education Topics: General Nutrition Guidelines/Fats and Fiber: -Group instruction provided by verbal, written material, models and posters to present the general guidelines for heart healthy nutrition. Gives an explanation and review of dietary fats and fiber.   Controlling Sodium/Reading Food Labels: -Group verbal and written material supporting the discussion of sodium use in heart healthy nutrition. Review and explanation with models, verbal and written materials for utilization of the food label.   Exercise Physiology & Risk Factors: - Group verbal and written instruction with models to review the exercise physiology of the cardiovascular system and associated critical values. Details cardiovascular disease risk factors and the goals associated with each risk factor.   Aerobic Exercise & Resistance Training: - Gives group verbal and written discussion on the health impact of inactivity. On the components of aerobic and resistive training programs and the benefits of this training and how to safely progress through these programs.   Flexibility, Balance, General Exercise Guidelines: - Provides group verbal and written instruction on the benefits of flexibility and balance training programs. Provides general exercise guidelines with specific guidelines to those with heart or lung disease. Demonstration and skill practice provided.   Stress Management: - Provides group verbal and written instruction about the health risks of elevated stress, cause of high stress, and healthy ways to reduce stress.   Depression: - Provides group verbal and written instruction on the  correlation between heart/lung disease and depressed mood, treatment options, and the stigmas associated with seeking treatment.   Anatomy & Physiology of the Heart: - Group verbal and written instruction and models provide basic cardiac anatomy and physiology, with the coronary electrical and arterial systems. Review of: AMI, Angina, Valve disease, Heart Failure, Cardiac Arrhythmia, Pacemakers, and the ICD.   Cardiac Procedures: - Group verbal and written instruction and models to describe the testing methods done to diagnose heart disease. Reviews the outcomes of the test results. Describes the treatment choices: Medical Management, Angioplasty, or Coronary Bypass Surgery.   Cardiac Medications: - Group verbal and written instruction to review commonly prescribed medications for heart disease. Reviews the medication, class of the drug, and side effects. Includes the steps to properly store meds and maintain the prescription regimen.   Go Sex-Intimacy & Heart Disease, Get SMART - Goal Setting: - Group verbal and written instruction through game format to discuss heart disease and the return to sexual intimacy. Provides group verbal and written material to discuss and apply goal setting through the application of the S.M.A.R.T. Method.   Other Matters of the Heart: - Provides group verbal, written materials and models to describe Heart Failure, Angina, Valve Disease, and Diabetes in the realm of heart disease. Includes description of the disease process and treatment options available to the cardiac patient.   Exercise & Equipment Safety: - Individual verbal instruction and demonstration of equipment use and safety with use of the equipment.  Flowsheet Row Cardiac Rehab from 02/08/2017 in [facility] Cardiac and Pulmonary Rehab  Date  02/08/17  Educator  C. Enterkin, RN  Instruction Review Code  1- partially meets, needs review/practice      Infection Prevention: - Provides verbal and written  material to individual with discussion of infection control including proper hand washing and proper equipment cleaning during exercise session. Flowsheet Row Cardiac Rehab from 02/08/2017 in [facility] Cardiac and Pulmonary Rehab  Date  02/08/17  Educator  C. EnterkinRN  Instruction Review Code  2- meets goals/outcomes      Falls Prevention: - Provides verbal and written material to individual with discussion of falls prevention and safety. Flowsheet Row Cardiac Rehab from 02/08/2017 in [facility] Cardiac and Pulmonary Rehab  Date  02/08/17  Educator  C. Olton  Instruction Review Code  2- meets goals/outcomes      Diabetes: - Individual verbal and written instruction to review signs/symptoms of diabetes, desired ranges of glucose level fasting, after meals and with exercise. Advice that pre and post exercise glucose checks will be done for 3 sessions at entry of program.    Knowledge Questionnaire Score:     Knowledge Questionnaire Score - 02/08/17 1415      Knowledge Questionnaire Score   Pre Score --  Kristen Lloyd did not want to do this extra paperwork since she was in Cardiac Rehab before.       Core Components/Risk Factors/Patient Goals at Admission:     Personal Goals and Risk Factors at Admission - 02/08/17 1422      Core Components/Risk Factors/Patient Goals on Admission    Weight Management Yes;Weight Loss   Intervention Weight Management: Develop a combined nutrition and exercise program designed to reach desired caloric intake, while maintaining appropriate intake of nutrient and fiber, sodium and fats, and appropriate energy expenditure required for the weight goal.;Weight Management: Provide education and appropriate resources to help participant work on and attain dietary goals.   Admit Weight 149 lb (67.6 kg)   Goal Weight: Short Term 145 lb (65.8 kg)   Goal Weight: Long Term 140 lb (63.5 kg)   Expected Outcomes Short Term: Continue to assess and modify interventions until  short term weight is achieved;Long Term: Adherence to nutrition and physical activity/exercise program aimed toward attainment of established weight goal;Weight Loss: Understanding of general recommendations for a balanced deficit meal plan, which promotes 1-2 lb weight loss per week and includes a negative energy balance of [phone] kcal/d;Understanding recommendations for meals to include 15-35% energy as protein, 25-35% energy from fat, 35-60% energy from carbohydrates, less than 200mg  of dietary cholesterol, 20-35 gm of total fiber daily;Understanding of distribution of calorie intake throughout the day with the consumption of 4-5 meals/snacks   Increase Strength and Stamina Yes   Intervention Provide advice, education, support and counseling about physical activity/exercise needs.;Develop an individualized exercise prescription for aerobic and resistive training based on initial evaluation findings, risk stratification, comorbidities and participant's personal goals.   Expected Outcomes Achievement of increased cardiorespiratory fitness and enhanced flexibility, muscular endurance and strength shown through measurements of functional capacity and personal statement of participant.   Lipids Yes   Intervention Provide education and support for participant on nutrition & aerobic/resistive exercise along with prescribed medications to achieve LDL 70mg , HDL >40mg .   Expected Outcomes Short Term: Participant states understanding of desired cholesterol values and is compliant with medications prescribed. Participant is following exercise prescription and nutrition guidelines.;Long Term: Cholesterol controlled with medications as prescribed, with individualized exercise RX  and with personalized nutrition plan. Value goals: LDL < 70mg , HDL > 40 mg.      Core Components/Risk Factors/Patient Goals Review:    Core Components/Risk Factors/Patient Goals at Discharge (Final Review):    ITP Comments:      ITP Comments    Row Name 02/08/17 1421 02/23/17 0610         ITP Comments Kristen Lloyd did not want to do this extra paperwork since she was in Cardiac Rehab before.  30 day review. Continue with ITP unless directed changes per Medical Director review. New to program has not attended since medical review         Comments:

## 2017-03-14 DIAGNOSIS — E7800 Pure hypercholesterolemia, unspecified: Secondary | ICD-10-CM | POA: Diagnosis not present

## 2017-03-14 DIAGNOSIS — I10 Essential (primary) hypertension: Secondary | ICD-10-CM | POA: Diagnosis not present

## 2017-03-14 DIAGNOSIS — I2510 Atherosclerotic heart disease of native coronary artery without angina pectoris: Secondary | ICD-10-CM | POA: Diagnosis not present

## 2017-03-14 DIAGNOSIS — Z0000 Encounter for general adult medical examination without abnormal findings: Secondary | ICD-10-CM | POA: Diagnosis not present

## 2017-03-14 DIAGNOSIS — G459 Transient cerebral ischemic attack, unspecified: Secondary | ICD-10-CM | POA: Diagnosis not present

## 2017-03-23 ENCOUNTER — Encounter: Payer: Self-pay | Admitting: *Deleted

## 2017-03-23 DIAGNOSIS — Z9861 Coronary angioplasty status: Secondary | ICD-10-CM

## 2017-03-23 NOTE — Progress Notes (Signed)
Cardiac Individual Treatment Plan  Patient Details  Name: ANAYELY CONSTANTINE MRN: [id number] Date of Birth: [birth year] Referring Provider:     Cardiac Rehab from 02/08/2017 in [facility] Cardiac and Pulmonary Rehab  Referring Provider  Bartholome Bill MD      Initial Encounter Date:    Cardiac Rehab from 02/08/2017 in [facility] Cardiac and Pulmonary Rehab  Date  02/08/17  Referring Provider  Bartholome Bill MD      Visit Diagnosis: S/P PTCA (percutaneous transluminal coronary angioplasty)  Patient's Home Medications on Admission:  Current Outpatient Prescriptions:  .  alum & mag hydroxide-simeth (MYLANTA) 200-200-20 MG/5ML suspension, Take 30 mLs by mouth every 6 (six) hours as needed for indigestion or heartburn., Disp: , Rfl:  .  alum hydroxide-mag trisilicate (GAVISCON) 12-87 MG CHEW chewable tablet, Chew 1 tablet by mouth 5 (five) times daily as needed for indigestion or heartburn., Disp: , Rfl:  .  aspirin 81 MG tablet, Take 81 mg by mouth daily., Disp: , Rfl:  .  cholecalciferol (VITAMIN D) 1000 UNITS tablet, Take 1,000 Units by mouth daily., Disp: , Rfl:  .  clopidogrel (PLAVIX) 75 MG tablet, Take 1 tablet (75 mg total) by mouth daily with breakfast., Disp: 30 tablet, Rfl: 11 .  fenofibrate 160 MG tablet, Take 80 mg by mouth daily., Disp: , Rfl:  .  fluticasone (FLONASE) 50 MCG/ACT nasal spray, Place 1 spray into both nostrils daily., Disp: , Rfl:  .  omeprazole (PRILOSEC) 20 MG capsule, Take 1 capsule (20 mg total) by mouth daily. Take 12 hours apart from plavix, Disp: 30 capsule, Rfl: 11 .  sodium chloride (OCEAN) 0.65 % SOLN nasal spray, Place 2 sprays into both nostrils every 2 (two) hours while awake. (Patient taking differently: Place 2 sprays into both nostrils 2 (two) times daily as needed for congestion. ), Disp: , Rfl: 0 .  traMADol (ULTRAM) 50 MG tablet, Take 25 mg by mouth every 6 (six) hours as needed for moderate pain., Disp: , Rfl:  No current facility-administered medications for  this visit.   Facility-Administered Medications Ordered in Other Visits:  .  0.9 %  sodium chloride infusion, 250 mL, Intravenous, PRN, Teodoro Spray, MD .  [EXPIRED] 0.9% sodium chloride infusion, 3 mL/kg/hr, Intravenous, Continuous **FOLLOWED BY** 0.9% sodium chloride infusion, 1 mL/kg/hr, Intravenous, Continuous, Teodoro Spray, MD .  sodium chloride flush (NS) 0.9 % injection 3 mL, 3 mL, Intravenous, Q12H, Teodoro Spray, MD .  sodium chloride flush (NS) 0.9 % injection 3 mL, 3 mL, Intravenous, PRN, Teodoro Spray, MD  Past Medical History: Past Medical History:  Diagnosis Date  . Acid reflux   . Coronary artery disease   . Stroke [facility])     Tobacco Use: History  Smoking Status  . Never Smoker  Smokeless Tobacco  . Never Used    Labs: Recent Review Flowsheet Data    There is no flowsheet data to display.       Exercise Target Goals:    Exercise Program Goal: Individual exercise prescription set with THRR, safety & activity barriers. Participant demonstrates ability to understand and report RPE using BORG scale, to self-measure pulse accurately, and to acknowledge the importance of the exercise prescription.  Exercise Prescription Goal: Starting with aerobic activity 30 plus minutes a day, 3 days per week for initial exercise prescription. Provide home exercise prescription and guidelines that participant acknowledges understanding prior to discharge.  Activity Barriers & Risk Stratification:     Activity Barriers &  Cardiac Risk Stratification - 02/08/17 1414      Activity Barriers & Cardiac Risk Stratification   Activity Barriers Joint Problems;Deconditioning;Muscular Weakness  chronic knee pain   Cardiac Risk Stratification Moderate      6 Minute Walk:     6 Minute Walk    Row Name 02/08/17 1440         6 Minute Walk   Phase Initial     Distance 1200 feet     Walk Time 6 minutes     MPH 2.27     METS 2.68     RPE 13     VO2 Peak 7.35      Symptoms Yes (comment)     Comments chronic knee pain 6/10, generalized leg weakness     Resting HR 64 bpm     Resting BP 130/70     Max Ex. HR 92 bpm     Max Ex. BP 128/70        Oxygen Initial Assessment:   Oxygen Re-Evaluation:   Oxygen Discharge (Final Oxygen Re-Evaluation):   Initial Exercise Prescription:     Initial Exercise Prescription - 02/08/17 1400      Date of Initial Exercise RX and Referring Provider   Date 02/08/17   Referring Provider Bartholome Bill MD     Treadmill   MPH 2   Grade 0.5   Minutes 15   METs 2.67     NuStep   Level 3   Minutes 15   METs 2     Elliptical   Level 1   Speed 3   Minutes 15     Prescription Details   Frequency (times per week) 3   Duration Progress to 45 minutes of aerobic exercise without signs/symptoms of physical distress     Intensity   THRR 40-80% of Max Heartrate 96-127   Ratings of Perceived Exertion 11-13   Perceived Dyspnea 0-4     Progression   Progression Continue to progress workloads to maintain intensity without signs/symptoms of physical distress.     Resistance Training   Training Prescription Yes   Weight 3 lbs   Reps 10-15      Perform Capillary Blood Glucose checks as needed.  Exercise Prescription Changes:     Exercise Prescription Changes    Row Name 02/08/17 1400             Response to Exercise   Blood Pressure (Admit) 130/70       Blood Pressure (Exercise) 128/70       Heart Rate (Admit) 64 bpm       Heart Rate (Exercise) 92 bpm       Oxygen Saturation (Admit) 98 %       Oxygen Saturation (Exercise) 100 %       Rating of Perceived Exertion (Exercise) 13       Symptoms knee pain 6/10, generalized leg weakness         Exercise Review   Progression -  walk test results          Exercise Comments:     Exercise Comments    Row Name 02/08/17 1443           Exercise Comments Cassadie is only planning to do two weeks and then return to the [employer]           Exercise Goals and Review:   Exercise Goals Re-Evaluation :   Discharge Exercise Prescription (Final Exercise Prescription Changes):  Exercise Prescription Changes - 02/08/17 1400      Response to Exercise   Blood Pressure (Admit) 130/70   Blood Pressure (Exercise) 128/70   Heart Rate (Admit) 64 bpm   Heart Rate (Exercise) 92 bpm   Oxygen Saturation (Admit) 98 %   Oxygen Saturation (Exercise) 100 %   Rating of Perceived Exertion (Exercise) 13   Symptoms knee pain 6/10, generalized leg weakness     Exercise Review   Progression --  walk test results      Nutrition:  Target Goals: Understanding of nutrition guidelines, daily intake of sodium 1500mg , cholesterol 200mg , calories 30% from fat and 7% or less from saturated fats, daily to have 5 or more servings of fruits and vegetables.  Biometrics:     Pre Biometrics - 02/08/17 1443      Pre Biometrics   Height 5' 1.1" (1.552 m)   Weight 149 lb (67.6 kg)   Waist Circumference 36 inches   Hip Circumference 42 inches   Waist to Hip Ratio 0.86 %   BMI (Calculated) 28.1   Single Leg Stand 30 seconds       Nutrition Therapy Plan and Nutrition Goals:     Nutrition Therapy & Goals - 02/08/17 1421      Personal Nutrition Goals   Comments Low Salt diet     Intervention Plan   Intervention Prescribe, educate and counsel regarding individualized specific dietary modifications aiming towards targeted core components such as weight, hypertension, lipid management, diabetes, heart failure and other comorbidities.;Nutrition handout(s) given to patient.   Expected Outcomes Short Term Goal: Understand basic principles of dietary content, such as calories, fat, sodium, cholesterol and nutrients.;Short Term Goal: A plan has been developed with personal nutrition goals set during dietitian appointment.;Long Term Goal: Adherence to prescribed nutrition plan.      Nutrition Discharge: Rate Your Plate  Scores:   Nutrition Goals Re-Evaluation:   Nutrition Goals Discharge (Final Nutrition Goals Re-Evaluation):   Psychosocial: Target Goals: Acknowledge presence or absence of significant depression and/or stress, maximize coping skills, provide positive support system. Participant is able to verbalize types and ability to use techniques and skills needed for reducing stress and depression.   Initial Review & Psychosocial Screening:     Initial Psych Review & Screening - 02/08/17 1422      Initial Review   Current issues with Current Sleep Concerns     Family Dynamics   Good Support System? Yes   Comments Dannia has been married for over 30 years. He is very supportive.      Barriers   Psychosocial barriers to participate in program There are no identifiable barriers or psychosocial needs.     Screening Interventions   Interventions Encouraged to exercise      Quality of Life Scores:      Quality of Life - 02/08/17 1423      Quality of Life Scores   Health/Function Pre --  Kennley prefered not to do this form since she said she was in Cardiac Rehab before.       PHQ-9: Recent Review Flowsheet Data    There is no flowsheet data to display.     Interpretation of Total Score  Total Score Depression Severity:  1-4 = Minimal depression, 5-9 = Mild depression, 10-14 = Moderate depression, 15-19 = Moderately severe depression, 20-27 = Severe depression   Psychosocial Evaluation and Intervention:   Psychosocial Re-Evaluation:   Psychosocial Discharge (Final Psychosocial Re-Evaluation):   Vocational Rehabilitation: Provide  vocational rehab assistance to qualifying candidates.   Vocational Rehab Evaluation & Intervention:     Vocational Rehab - 02/08/17 1416      Initial Vocational Rehab Evaluation & Intervention   Assessment shows need for Vocational Rehabilitation No      Education: Education Goals: Education classes will be provided on a weekly basis,  covering required topics. Participant will state understanding/return demonstration of topics presented.  Learning Barriers/Preferences:     Learning Barriers/Preferences - 02/08/17 1415      Learning Barriers/Preferences   Learning Barriers None   Learning Preferences Group Instruction      Education Topics: General Nutrition Guidelines/Fats and Fiber: -Group instruction provided by verbal, written material, models and posters to present the general guidelines for heart healthy nutrition. Gives an explanation and review of dietary fats and fiber.   Controlling Sodium/Reading Food Labels: -Group verbal and written material supporting the discussion of sodium use in heart healthy nutrition. Review and explanation with models, verbal and written materials for utilization of the food label.   Exercise Physiology & Risk Factors: - Group verbal and written instruction with models to review the exercise physiology of the cardiovascular system and associated critical values. Details cardiovascular disease risk factors and the goals associated with each risk factor.   Aerobic Exercise & Resistance Training: - Gives group verbal and written discussion on the health impact of inactivity. On the components of aerobic and resistive training programs and the benefits of this training and how to safely progress through these programs.   Flexibility, Balance, General Exercise Guidelines: - Provides group verbal and written instruction on the benefits of flexibility and balance training programs. Provides general exercise guidelines with specific guidelines to those with heart or lung disease. Demonstration and skill practice provided.   Stress Management: - Provides group verbal and written instruction about the health risks of elevated stress, cause of high stress, and healthy ways to reduce stress.   Depression: - Provides group verbal and written instruction on the correlation between  heart/lung disease and depressed mood, treatment options, and the stigmas associated with seeking treatment.   Anatomy & Physiology of the Heart: - Group verbal and written instruction and models provide basic cardiac anatomy and physiology, with the coronary electrical and arterial systems. Review of: AMI, Angina, Valve disease, Heart Failure, Cardiac Arrhythmia, Pacemakers, and the ICD.   Cardiac Procedures: - Group verbal and written instruction and models to describe the testing methods done to diagnose heart disease. Reviews the outcomes of the test results. Describes the treatment choices: Medical Management, Angioplasty, or Coronary Bypass Surgery.   Cardiac Medications: - Group verbal and written instruction to review commonly prescribed medications for heart disease. Reviews the medication, class of the drug, and side effects. Includes the steps to properly store meds and maintain the prescription regimen.   Go Sex-Intimacy & Heart Disease, Get SMART - Goal Setting: - Group verbal and written instruction through game format to discuss heart disease and the return to sexual intimacy. Provides group verbal and written material to discuss and apply goal setting through the application of the S.M.A.R.T. Method.   Other Matters of the Heart: - Provides group verbal, written materials and models to describe Heart Failure, Angina, Valve Disease, and Diabetes in the realm of heart disease. Includes description of the disease process and treatment options available to the cardiac patient.   Exercise & Equipment Safety: - Individual verbal instruction and demonstration of equipment use and safety with use of the equipment.  Cardiac Rehab from 02/08/2017 in [facility] Cardiac and Pulmonary Rehab  Date  02/08/17  Educator  C. Enterkin, RN  Instruction Review Code  1- partially meets, needs review/practice      Infection Prevention: - Provides verbal and written material to individual with  discussion of infection control including proper hand washing and proper equipment cleaning during exercise session.   Cardiac Rehab from 02/08/2017 in [facility] Cardiac and Pulmonary Rehab  Date  02/08/17  Educator  C. EnterkinRN  Instruction Review Code  2- meets goals/outcomes      Falls Prevention: - Provides verbal and written material to individual with discussion of falls prevention and safety.   Cardiac Rehab from 02/08/2017 in [facility] Cardiac and Pulmonary Rehab  Date  02/08/17  Educator  C. [place]  Instruction Review Code  2- meets goals/outcomes      Diabetes: - Individual verbal and written instruction to review signs/symptoms of diabetes, desired ranges of glucose level fasting, after meals and with exercise. Advice that pre and post exercise glucose checks will be done for 3 sessions at entry of program.    Knowledge Questionnaire Score:     Knowledge Questionnaire Score - 02/08/17 1415      Knowledge Questionnaire Score   Pre Score --  Vaniah did not want to do this extra paperwork since she was in Cardiac Rehab before.       Core Components/Risk Factors/Patient Goals at Admission:     Personal Goals and Risk Factors at Admission - 02/08/17 1422      Core Components/Risk Factors/Patient Goals on Admission    Weight Management Yes;Weight Loss   Intervention Weight Management: Develop a combined nutrition and exercise program designed to reach desired caloric intake, while maintaining appropriate intake of nutrient and fiber, sodium and fats, and appropriate energy expenditure required for the weight goal.;Weight Management: Provide education and appropriate resources to help participant work on and attain dietary goals.   Admit Weight 149 lb (67.6 kg)   Goal Weight: Short Term 145 lb (65.8 kg)   Goal Weight: Long Term 140 lb (63.5 kg)   Expected Outcomes Short Term: Continue to assess and modify interventions until short term weight is achieved;Long Term: Adherence  to nutrition and physical activity/exercise program aimed toward attainment of established weight goal;Weight Loss: Understanding of general recommendations for a balanced deficit meal plan, which promotes 1-2 lb weight loss per week and includes a negative energy balance of [phone] kcal/d;Understanding recommendations for meals to include 15-35% energy as protein, 25-35% energy from fat, 35-60% energy from carbohydrates, less than 200mg  of dietary cholesterol, 20-35 gm of total fiber daily;Understanding of distribution of calorie intake throughout the day with the consumption of 4-5 meals/snacks   Increase Strength and Stamina Yes   Intervention Provide advice, education, support and counseling about physical activity/exercise needs.;Develop an individualized exercise prescription for aerobic and resistive training based on initial evaluation findings, risk stratification, comorbidities and participant's personal goals.   Expected Outcomes Achievement of increased cardiorespiratory fitness and enhanced flexibility, muscular endurance and strength shown through measurements of functional capacity and personal statement of participant.   Lipids Yes   Intervention Provide education and support for participant on nutrition & aerobic/resistive exercise along with prescribed medications to achieve LDL 70mg , HDL >40mg .   Expected Outcomes Short Term: Participant states understanding of desired cholesterol values and is compliant with medications prescribed. Participant is following exercise prescription and nutrition guidelines.;Long Term: Cholesterol controlled with medications as prescribed, with individualized exercise RX and with  personalized nutrition plan. Value goals: LDL < 70mg , HDL > 40 mg.      Core Components/Risk Factors/Patient Goals Review:    Core Components/Risk Factors/Patient Goals at Discharge (Final Review):    ITP Comments:     ITP Comments    Row Name 02/08/17 1421 02/23/17 0610  03/23/17 0643       ITP Comments Aiden did not want to do this extra paperwork since she was in Cardiac Rehab before.  30 day review. Continue with ITP unless directed changes per Medical Director review. New to program has not attended since medical review 30 day review. Continue with ITP unless directed changes per Medical Director review   discharged per MD approval to return to the maintenance program        Comments:

## 2017-07-07 DIAGNOSIS — R002 Palpitations: Secondary | ICD-10-CM | POA: Diagnosis not present

## 2017-07-07 DIAGNOSIS — I10 Essential (primary) hypertension: Secondary | ICD-10-CM | POA: Diagnosis not present

## 2017-07-07 DIAGNOSIS — E7800 Pure hypercholesterolemia, unspecified: Secondary | ICD-10-CM | POA: Diagnosis not present

## 2017-07-07 DIAGNOSIS — I2510 Atherosclerotic heart disease of native coronary artery without angina pectoris: Secondary | ICD-10-CM | POA: Diagnosis not present

## 2017-09-02 DIAGNOSIS — L821 Other seborrheic keratosis: Secondary | ICD-10-CM | POA: Diagnosis not present

## 2017-09-06 DIAGNOSIS — G459 Transient cerebral ischemic attack, unspecified: Secondary | ICD-10-CM | POA: Diagnosis not present

## 2017-09-06 DIAGNOSIS — I10 Essential (primary) hypertension: Secondary | ICD-10-CM | POA: Diagnosis not present

## 2017-09-06 DIAGNOSIS — I2510 Atherosclerotic heart disease of native coronary artery without angina pectoris: Secondary | ICD-10-CM | POA: Diagnosis not present

## 2017-09-06 DIAGNOSIS — E7800 Pure hypercholesterolemia, unspecified: Secondary | ICD-10-CM | POA: Diagnosis not present

## 2017-09-12 DIAGNOSIS — Z23 Encounter for immunization: Secondary | ICD-10-CM | POA: Diagnosis not present

## 2017-09-14 DIAGNOSIS — I2510 Atherosclerotic heart disease of native coronary artery without angina pectoris: Secondary | ICD-10-CM | POA: Diagnosis not present

## 2017-09-14 DIAGNOSIS — I10 Essential (primary) hypertension: Secondary | ICD-10-CM | POA: Diagnosis not present

## 2017-09-14 DIAGNOSIS — R5383 Other fatigue: Secondary | ICD-10-CM | POA: Diagnosis not present

## 2017-09-14 DIAGNOSIS — E7800 Pure hypercholesterolemia, unspecified: Secondary | ICD-10-CM | POA: Diagnosis not present

## 2017-09-21 ENCOUNTER — Other Ambulatory Visit: Payer: Self-pay | Admitting: Infectious Diseases

## 2017-09-21 DIAGNOSIS — Z1231 Encounter for screening mammogram for malignant neoplasm of breast: Secondary | ICD-10-CM

## 2017-10-26 ENCOUNTER — Ambulatory Visit
Admission: EM | Admit: 2017-10-26 | Discharge: 2017-10-26 | Disposition: A | Discharge status: Home / Self Care | Payer: Medicare Other | Attending: Family Medicine | Admitting: Family Medicine

## 2017-10-26 DIAGNOSIS — Z7982 Long term (current) use of aspirin: Secondary | ICD-10-CM | POA: Diagnosis not present

## 2017-10-26 DIAGNOSIS — R350 Frequency of micturition: Secondary | ICD-10-CM | POA: Diagnosis not present

## 2017-10-26 DIAGNOSIS — N3001 Acute cystitis with hematuria: Secondary | ICD-10-CM | POA: Diagnosis not present

## 2017-10-26 DIAGNOSIS — Z8673 Personal history of transient ischemic attack (TIA), and cerebral infarction without residual deficits: Secondary | ICD-10-CM | POA: Diagnosis not present

## 2017-10-26 DIAGNOSIS — I25110 Atherosclerotic heart disease of native coronary artery with unstable angina pectoris: Secondary | ICD-10-CM | POA: Diagnosis not present

## 2017-10-26 DIAGNOSIS — K219 Gastro-esophageal reflux disease without esophagitis: Secondary | ICD-10-CM | POA: Diagnosis not present

## 2017-10-26 DIAGNOSIS — Z955 Presence of coronary angioplasty implant and graft: Secondary | ICD-10-CM | POA: Diagnosis not present

## 2017-10-26 LAB — URINALYSIS, COMPLETE (UACMP) WITH MICROSCOPIC
Bilirubin Urine: NEGATIVE
Glucose, UA: NEGATIVE mg/dL
Nitrite: NEGATIVE
PROTEIN: NEGATIVE mg/dL
Specific Gravity, Urine: 1.02 (ref 1.005–1.030)
pH: 5.5 (ref 5.0–8.0)

## 2017-10-26 MED ORDER — CEFDINIR 300 MG PO CAPS: 300.0000 mg | ORAL_CAPSULE | Freq: Two times a day (BID) | ORAL | 0 refills | Status: DC

## 2017-10-26 NOTE — ED Triage Notes (Signed)
Patient complains of urinary frequency that started a few weeks ago. Patient states that symptoms worsened 2-3 days ago with burning after urination.

## 2017-10-26 NOTE — ED Provider Notes (Signed)
[facility]    CSN: [id number] Arrival date & time: 10/26/17  1036  History   Chief Complaint Chief Complaint  Patient presents with  . Urinary Frequency   HPI  76 year old female presents with concern of UTI.  Patient states that over the past few weeks she's had some nocturia. This seems to have improved. However for the past 2 days she's had burning after urination. She still has some nocturia but is improved. No associated fever. No abdominal pain. Flank pain. No known exacerbating or relieving factors. No medications or interventions tried. No other associated symptoms. No other complaints at this time.  Past Medical History:  Diagnosis Date  . Acid reflux   . Coronary artery disease   . Stroke [facility])    Patient Active Problem List   Diagnosis Date Noted  . Unstable angina ([place]) 01/19/2017  . CAD (coronary artery disease) 01/19/2017   Past Surgical History:  Procedure Laterality Date  . ABDOMINAL HYSTERECTOMY    . BREAST BIOPSY Left 1990's   lt bx x 2-neg  . CARDIAC CATHETERIZATION N/A 01/19/2017   Procedure: Left Heart Cath and Coronary Angiography;  Surgeon: Teodoro Spray, MD;  Location: [place] [facility];  Service: Cardiovascular;  Laterality: N/A;  . CARDIAC CATHETERIZATION N/A 01/19/2017   Procedure: Coronary Stent Intervention;  Surgeon: Isaias Cowman, MD;  Location: [place] [facility];  Service: Cardiovascular;  Laterality: N/A;  . CARDIAC SURGERY    . TONSILLECTOMY     OB History    No data available     Home Medications    Prior to Admission medications   Medication Sig Start Date End Date Taking? Authorizing Provider  alum & mag hydroxide-simeth (MYLANTA) 200-200-20 MG/5ML suspension Take 30 mLs by mouth every 6 (six) hours as needed for indigestion or heartburn.   Yes [provider]  alum hydroxide-mag trisilicate (GAVISCON) 35-46 MG CHEW chewable tablet Chew 1 tablet by mouth 5 (five) times daily as needed for  indigestion or heartburn.   Yes [provider]  aspirin 81 MG tablet Take 81 mg by mouth daily.   Yes [provider]  cholecalciferol (VITAMIN D) 1000 UNITS tablet Take 1,000 Units by mouth daily.   Yes [provider]  clopidogrel (PLAVIX) 75 MG tablet Take 1 tablet (75 mg total) by mouth daily with breakfast. 01/20/17  Yes Fath, Javier Docker, MD  fenofibrate 160 MG tablet Take 80 mg by mouth daily.   Yes [provider]  fluticasone (FLONASE) 50 MCG/ACT nasal spray Place 1 spray into both nostrils daily.   Yes [provider]  omeprazole (PRILOSEC) 20 MG capsule Take 1 capsule (20 mg total) by mouth daily. Take 12 hours apart from plavix 01/20/17  Yes Fath, Javier Docker, MD  sodium chloride (OCEAN) 0.65 % SOLN nasal spray Place 2 sprays into both nostrils every 2 (two) hours while awake. Patient taking differently: Place 2 sprays into both nostrils 2 (two) times daily as needed for congestion.  11/07/15  Yes Betancourt, Aura Fey, NP  traMADol (ULTRAM) 50 MG tablet Take 25 mg by mouth every 6 (six) hours as needed for moderate pain.   Yes [provider]  cefdinir (OMNICEF) 300 MG capsule Take 1 capsule (300 mg total) by mouth 2 (two) times daily. 10/26/17   Coral Spikes, DO    Family History Family History  Problem Relation Age of Onset  . Breast cancer Cousin 18       mat cousin  Social History Social History  Substance Use Topics  . Smoking status: Never Smoker  . Smokeless tobacco: Never Used  . Alcohol use Yes     Comment: Socially     Allergies   Amoxicillin and Septra [sulfamethoxazole-trimethoprim]   Review of Systems Review of Systems  Constitutional: Negative for fever.  Genitourinary: Positive for enuresis. Negative for flank pain.       Nocturia.  Musculoskeletal: Negative for back pain.   Physical Exam Triage Vital Signs ED Triage Vitals  Enc Vitals Group     BP 10/26/17 1103 (!) 159/73     Pulse Rate  10/26/17 1103 83     Resp 10/26/17 1103 18     Temp 10/26/17 1103 97.7 F (36.5 C)     Temp Source 10/26/17 1103 Oral     SpO2 10/26/17 1103 99 %     Weight 10/26/17 1102 152 lb (68.9 kg)     Height 10/26/17 1102 5\' 1"  (1.549 m)     Head Circumference --      Peak Flow --      Pain Score 10/26/17 1102 1     Pain Loc --      Pain Edu? --      Excl. in [place]? --    Updated Vital Signs BP (!) 159/73 (BP Location: Left Arm)   Pulse 83   Temp 97.7 F (36.5 C) (Oral)   Resp 18   Ht 5\' 1"  (1.549 m)   Wt 152 lb (68.9 kg)   SpO2 99%   BMI 28.72 kg/m  Physical Exam  Constitutional: She is oriented to person, place, and time. She appears well-developed. No distress.  HENT:  Head: Normocephalic and atraumatic.  Eyes: Conjunctivae are normal. No scleral icterus.  Cardiovascular: Normal rate and regular rhythm.   No murmur heard. Pulmonary/Chest: Effort normal. She has no wheezes. She has no rales.  Abdominal: Soft. She exhibits no distension. There is no tenderness. There is no rebound and no guarding.  Neurological: She is alert and oriented to person, place, and time.  Psychiatric: She has a normal mood and affect.  Vitals reviewed.  UC Treatments / Results  Labs (all labs ordered are listed, but only abnormal results are displayed) Labs Reviewed  URINALYSIS, COMPLETE (UACMP) WITH MICROSCOPIC - Abnormal; Notable for the following:       Result Value   Hgb urine dipstick TRACE (*)    Ketones, ur TRACE (*)    Leukocytes, UA SMALL (*)    Squamous Epithelial / LPF TOO NUMEROUS TO COUNT (*)    Bacteria, UA RARE (*)    All other components within normal limits  URINE CULTURE    EKG  EKG Interpretation None      Radiology No results found.  Procedures Procedures (including critical care time)  Medications Ordered in UC Medications - No data to display   Initial Impression / Assessment and Plan / UC Course  I have reviewed the triage vital signs and the nursing  notes.  Pertinent labs & imaging results that were available during my care of the patient were reviewed by me and considered in my medical decision making (see chart for details).     76 year old female presents with dysuria. Urinalysis with small leukocytes and trace hemoglobin. Obtaining culture. Treating empirically with Omnicef (this was selected due to allergy to penicillin and Bactrim; nitrofurantoin not recommended in the elderly; trying to avoid ciprofloxacin secondary to rising resistance).   Final Clinical Impressions(s) /  UC Diagnoses   Final diagnoses:  Acute cystitis with hematuria    New Prescriptions New Prescriptions   CEFDINIR (OMNICEF) 300 MG CAPSULE    Take 1 capsule (300 mg total) by mouth 2 (two) times daily.   Controlled Substance Prescriptions [place] Controlled Substance Registry consulted? Not Applicable   Coral Spikes, DO 10/26/17 1139

## 2017-10-27 LAB — URINE CULTURE

## 2017-10-28 ENCOUNTER — Encounter: Payer: Self-pay | Admitting: Emergency Medicine

## 2017-10-28 ENCOUNTER — Ambulatory Visit
Admission: EM | Admit: 2017-10-28 | Discharge: 2017-10-28 | Disposition: A | Discharge status: Home / Self Care | Payer: Medicare Other | Attending: Family Medicine | Admitting: Family Medicine

## 2017-10-28 DIAGNOSIS — N898 Other specified noninflammatory disorders of vagina: Secondary | ICD-10-CM | POA: Insufficient documentation

## 2017-10-28 DIAGNOSIS — R300 Dysuria: Secondary | ICD-10-CM | POA: Diagnosis not present

## 2017-10-28 DIAGNOSIS — N760 Acute vaginitis: Secondary | ICD-10-CM

## 2017-10-28 LAB — URINALYSIS, COMPLETE (UACMP) WITH MICROSCOPIC
BACTERIA UA: NONE SEEN
BILIRUBIN URINE: NEGATIVE
Glucose, UA: NEGATIVE mg/dL
Hgb urine dipstick: NEGATIVE
KETONES UR: NEGATIVE mg/dL
LEUKOCYTES UA: NEGATIVE
Nitrite: NEGATIVE
PH: 5.5 (ref 5.0–8.0)
PROTEIN: NEGATIVE mg/dL
Specific Gravity, Urine: 1.02 (ref 1.005–1.030)
WBC UA: NONE SEEN WBC/hpf (ref 0–5)

## 2017-10-28 MED ORDER — FLUCONAZOLE 150 MG PO TABS: 150.0000 mg | ORAL_TABLET | Freq: Every day | ORAL | 0 refills | Status: DC

## 2017-10-28 NOTE — ED Triage Notes (Signed)
Patient reports some burning and irritation on the outside of her vaginal area.  Patient denies burning when urinating.  Patient was seen here on 10/31 and was treated for a UTI.  Patient states that her symptoms have not improved with her current antibiotic.  Patient states that she is going out-of-town on Monday.

## 2017-10-28 NOTE — Discharge Instructions (Signed)
Take medication as prescribed. Drink plenty of fluids.  ° °Follow up with your primary care physician this week as needed. Return to Urgent care for new or worsening concerns.  ° °

## 2017-10-28 NOTE — ED Provider Notes (Signed)
[facility] ____________________________________________  Time seen: Approximately 3:44 PM  I have reviewed the triage vital signs and the nursing notes.   HISTORY  Chief Complaint Vaginal Irritation   HPI Kristen Lloyd is a 76 y.o. female presenting for evaluation of what she describes as a mild irritation and somewhat of a burning sensation on the outside part of her vaginal area beneath the urethra during urination, but also reports feeling this when sitting at rest as well.  Patient reports that she was here on 1031 and was being treated for a UTI with oral Omnicef, reports she has not felt like she is improved and she was notified that her urine culture today had grown multiple species.  The patient states not having urinary frequency or urgency.  Patient reports the symptoms have come on quickly over the last week and not present previous.  Denies accompanying abdominal pain, back pain, fevers, vaginal discharge, vaginal odor, urine odor or other complaints.  Reports continues to eat and drink well.  States the irritation is very mild, but states she is going out of town soon and wanted to make sure everything was okay.  States not a diabetic.  Denies concerns of STDs or sexual transmitted concerns.  Denies injury or trauma. Denies chest pain, shortness of breath, abdominal pain, or rash. Denies recent sickness. Denies recent antibiotic use.   Leonel Ramsay, MD: PCP   Past Medical History:  Diagnosis Date  . Acid reflux   . Coronary artery disease   . Stroke [facility])     Patient Active Problem List   Diagnosis Date Noted  . Unstable angina ([place]) 01/19/2017  . CAD (coronary artery disease) 01/19/2017    Past Surgical History:  Procedure Laterality Date  . ABDOMINAL HYSTERECTOMY    . BREAST BIOPSY Left 1990's   lt bx x 2-neg  . CARDIAC CATHETERIZATION N/A 01/19/2017   Procedure: Left Heart Cath and Coronary Angiography;  Surgeon: Teodoro Spray, MD;   Location: [place] [facility];  Service: Cardiovascular;  Laterality: N/A;  . CARDIAC CATHETERIZATION N/A 01/19/2017   Procedure: Coronary Stent Intervention;  Surgeon: Isaias Cowman, MD;  Location: [place] [facility];  Service: Cardiovascular;  Laterality: N/A;  . CARDIAC SURGERY    . TONSILLECTOMY       No current facility-administered medications for this encounter.   Current Outpatient Prescriptions:  .  alum & mag hydroxide-simeth (MYLANTA) 200-200-20 MG/5ML suspension, Take 30 mLs by mouth every 6 (six) hours as needed for indigestion or heartburn., Disp: , Rfl:  .  alum hydroxide-mag trisilicate (GAVISCON) 15-40 MG CHEW chewable tablet, Chew 1 tablet by mouth 5 (five) times daily as needed for indigestion or heartburn., Disp: , Rfl:  .  aspirin 81 MG tablet, Take 81 mg by mouth daily., Disp: , Rfl:  .  cefdinir (OMNICEF) 300 MG capsule, Take 1 capsule (300 mg total) by mouth 2 (two) times daily., Disp: 14 capsule, Rfl: 0 .  cholecalciferol (VITAMIN D) 1000 UNITS tablet, Take 1,000 Units by mouth daily., Disp: , Rfl:  .  clopidogrel (PLAVIX) 75 MG tablet, Take 1 tablet (75 mg total) by mouth daily with breakfast., Disp: 30 tablet, Rfl: 11 .  fenofibrate 160 MG tablet, Take 80 mg by mouth daily., Disp: , Rfl:  .  fluconazole (DIFLUCAN) 150 MG tablet, Take 1 tablet (150 mg total) by mouth daily. Take one pill orally, then Repeat in 72 hrs as needed., Disp: 2 tablet, Rfl: 0 .  fluticasone (FLONASE) 50 MCG/ACT nasal spray, Place 1 spray into both nostrils daily., Disp: , Rfl:  .  omeprazole (PRILOSEC) 20 MG capsule, Take 1 capsule (20 mg total) by mouth daily. Take 12 hours apart from plavix, Disp: 30 capsule, Rfl: 11 .  sodium chloride (OCEAN) 0.65 % SOLN nasal spray, Place 2 sprays into both nostrils every 2 (two) hours while awake. (Patient taking differently: Place 2 sprays into both nostrils 2 (two) times daily as needed for congestion. ), Disp: , Rfl: 0 .  traMADol (ULTRAM) 50  MG tablet, Take 25 mg by mouth every 6 (six) hours as needed for moderate pain., Disp: , Rfl:   Facility-Administered Medications Ordered in Other Encounters:  .  0.9 %  sodium chloride infusion, 250 mL, Intravenous, PRN, Ubaldo Glassing, Javier Docker, MD .  [EXPIRED] 0.9% sodium chloride infusion, 3 mL/kg/hr, Intravenous, Continuous **FOLLOWED BY** 0.9% sodium chloride infusion, 1 mL/kg/hr, Intravenous, Continuous, Fath, Kenneth A, MD .  sodium chloride flush (NS) 0.9 % injection 3 mL, 3 mL, Intravenous, Q12H, Fath, Kenneth A, MD .  sodium chloride flush (NS) 0.9 % injection 3 mL, 3 mL, Intravenous, PRN, Ubaldo Glassing, Javier Docker, MD  Allergies Amoxicillin and Septra [sulfamethoxazole-trimethoprim]  Family History  Problem Relation Age of Onset  . Breast cancer Cousin 79       mat cousin    Social History Social History  Substance Use Topics  . Smoking status: Never Smoker  . Smokeless tobacco: Never Used  . Alcohol use Yes     Comment: Socially    Review of Systems Constitutional: No fever/chills Cardiovascular: Denies chest pain. Respiratory: Denies shortness of breath. Gastrointestinal: No abdominal pain.  No nausea, no vomiting.  No diarrhea.  Genitourinary: As above.  Musculoskeletal: Negative for back pain. Skin: Negative for rash..  ____________________________________________   PHYSICAL EXAM:  VITAL SIGNS: ED Triage Vitals  Enc Vitals Group     BP 10/28/17 1514 126/61     Pulse Rate 10/28/17 1514 69     Resp 10/28/17 1514 14     Temp 10/28/17 1514 98.4 F (36.9 C)     Temp Source 10/28/17 1514 Oral     SpO2 10/28/17 1514 97 %     Weight 10/28/17 1512 152 lb (68.9 kg)     Height 10/28/17 1512 5\' 1"  (1.549 m)     Head Circumference --      Peak Flow --      Pain Score 10/28/17 1513 2     Pain Loc --      Pain Edu? --      Excl. in [place]? --     Constitutional: Alert and oriented. Well appearing and in no acute distress. Cardiovascular: Normal rate, regular rhythm. Grossly  normal heart sounds.  Good peripheral circulation. Respiratory: Normal respiratory effort without tachypnea nor retractions. Breath sounds are clear and equal bilaterally. No wheezes, rales, rhonchi. Gastrointestinal: Soft and nontender. No distention. Normal Bowel sounds. No CVA tenderness. External vaginal exam: Chaperone offered, patient declined.  Patient with mild upper volar erythema, no drainage, no induration, nontender, no other skin changes or lesions noted. Musculoskeletal:  No midline cervical, thoracic or lumbar tenderness to palpation.  Neurologic:  Normal speech and language. Speech is normal. No gait instability.  Skin:  Skin is warm, dry and intact. No rash noted. Psychiatric: Mood and affect are normal. Speech and behavior are normal. Patient exhibits appropriate insight and judgment   ___________________________________________   LABS (all labs ordered are listed, but  only abnormal results are displayed)  Labs Reviewed  URINALYSIS, COMPLETE (UACMP) WITH MICROSCOPIC - Abnormal; Notable for the following:       Result Value   APPearance HAZY (*)    Squamous Epithelial / LPF 6-30 (*)    All other components within normal limits  URINE CULTURE   ____________________________________________  RADIOLOGY  No results found. ____________________________________________   PROCEDURES Procedures   INITIAL IMPRESSION / ASSESSMENT AND PLAN / ED COURSE  Pertinent labs & imaging results that were available during my care of the patient were reviewed by me and considered in my medical decision making (see chart for details).  Very well-appearing patient.  No acute distress.  Urinalysis today reviewed, suspect contaminated sample, doubt UTI, will culture urine.  Patient does noted to have appearance of some vaginitis, suspect yeast.  Also discussed with patient other irritative causes such as contact causes and vaginal atrophy.  Will empirically treat with oral Diflucan.   Encourage monitoring and follow-up for continued complaints.  Discussed follow up with Primary care physician this week. Discussed follow up and return parameters including no resolution or any worsening concerns. Patient verbalized understanding and agreed to plan.   ____________________________________________   FINAL CLINICAL IMPRESSION(S) / ED DIAGNOSES  Final diagnoses:  Vaginitis and vulvovaginitis     Discharge Medication List as of 10/28/2017  4:02 PM    START taking these medications   Details  fluconazole (DIFLUCAN) 150 MG tablet Take 1 tablet (150 mg total) by mouth daily. Take one pill orally, then Repeat in 72 hrs as needed., Starting Fri 10/28/2017, Normal        Note: This dictation was prepared with Dragon dictation along with smaller phrase technology. Any transcriptional errors that result from this process are unintentional.         Marylene Land, NP 10/28/17 1747

## 2017-10-30 LAB — URINE CULTURE: Culture: <10000 — AB

## 2017-11-07 ENCOUNTER — Ambulatory Visit
Admission: RE | Admit: 2017-11-07 | Discharge: 2017-11-07 | Disposition: A | Discharge status: Home / Self Care | Payer: Medicare Other | Source: Ambulatory Visit | Attending: Infectious Diseases | Admitting: Infectious Diseases

## 2017-11-07 DIAGNOSIS — Z1231 Encounter for screening mammogram for malignant neoplasm of breast: Secondary | ICD-10-CM | POA: Diagnosis not present

## 2018-01-05 DIAGNOSIS — H2513 Age-related nuclear cataract, bilateral: Secondary | ICD-10-CM | POA: Diagnosis not present

## 2018-01-12 DIAGNOSIS — G459 Transient cerebral ischemic attack, unspecified: Secondary | ICD-10-CM | POA: Diagnosis not present

## 2018-01-12 DIAGNOSIS — I10 Essential (primary) hypertension: Secondary | ICD-10-CM | POA: Diagnosis not present

## 2018-01-12 DIAGNOSIS — E7800 Pure hypercholesterolemia, unspecified: Secondary | ICD-10-CM | POA: Diagnosis not present

## 2018-01-12 DIAGNOSIS — I2510 Atherosclerotic heart disease of native coronary artery without angina pectoris: Secondary | ICD-10-CM | POA: Diagnosis not present

## 2018-03-08 DIAGNOSIS — J342 Deviated nasal septum: Secondary | ICD-10-CM | POA: Diagnosis not present

## 2018-03-08 DIAGNOSIS — J019 Acute sinusitis, unspecified: Secondary | ICD-10-CM | POA: Diagnosis not present

## 2018-03-21 DIAGNOSIS — I10 Essential (primary) hypertension: Secondary | ICD-10-CM | POA: Diagnosis not present

## 2018-03-21 DIAGNOSIS — E7800 Pure hypercholesterolemia, unspecified: Secondary | ICD-10-CM | POA: Diagnosis not present

## 2018-03-21 DIAGNOSIS — R5383 Other fatigue: Secondary | ICD-10-CM | POA: Diagnosis not present

## 2018-04-05 DIAGNOSIS — G459 Transient cerebral ischemic attack, unspecified: Secondary | ICD-10-CM | POA: Diagnosis not present

## 2018-04-05 DIAGNOSIS — E7800 Pure hypercholesterolemia, unspecified: Secondary | ICD-10-CM | POA: Diagnosis not present

## 2018-04-05 DIAGNOSIS — Z0000 Encounter for general adult medical examination without abnormal findings: Secondary | ICD-10-CM | POA: Diagnosis not present

## 2018-04-05 DIAGNOSIS — Z23 Encounter for immunization: Secondary | ICD-10-CM | POA: Diagnosis not present

## 2018-04-05 DIAGNOSIS — I2510 Atherosclerotic heart disease of native coronary artery without angina pectoris: Secondary | ICD-10-CM | POA: Diagnosis not present

## 2018-04-05 DIAGNOSIS — I10 Essential (primary) hypertension: Secondary | ICD-10-CM | POA: Diagnosis not present

## 2018-06-07 DIAGNOSIS — M5441 Lumbago with sciatica, right side: Secondary | ICD-10-CM | POA: Diagnosis not present

## 2018-06-18 ENCOUNTER — Ambulatory Visit
Admission: EM | Admit: 2018-06-18 | Discharge: 2018-06-18 | Disposition: A | Discharge status: Home / Self Care | Payer: Medicare Other | Attending: Registered Nurse | Admitting: Registered Nurse

## 2018-06-18 ENCOUNTER — Other Ambulatory Visit: Payer: Self-pay

## 2018-06-18 DIAGNOSIS — J0101 Acute recurrent maxillary sinusitis: Secondary | ICD-10-CM

## 2018-06-18 MED ORDER — DOXYCYCLINE HYCLATE 100 MG PO CAPS: 100.0000 mg | ORAL_CAPSULE | Freq: Two times a day (BID) | ORAL | 0 refills | Status: DC

## 2018-06-18 MED ORDER — ACETAMINOPHEN 500 MG PO TABS: 1000.0000 mg | ORAL_TABLET | Freq: Four times a day (QID) | ORAL | 0 refills | Status: AC | PRN

## 2018-06-18 MED ORDER — FLUTICASONE PROPIONATE 50 MCG/ACT NA SUSP: 1.0000 | Freq: Two times a day (BID) | NASAL | 0 refills | Status: AC

## 2018-06-18 NOTE — ED Triage Notes (Signed)
Pt c/o left side sinus pain/pressure with ear ache for over a week. States she has a hx of sinus infections in the past and this feels similar

## 2018-06-18 NOTE — ED Provider Notes (Signed)
[facility]    CSN: [id number] Arrival date & time: 06/18/18  0801     History   Chief Complaint Chief Complaint  Patient presents with  . Sinus Problem    HPI Kristen Lloyd is a 77 y.o. female.   77y/o caucasian married female here for repeat sinusitis.  Second time this year.  Has established relationship with Dr Kathyrn Sheriff ENT  Was told if more than 1 infection per year to follow up with him.  Had been getting CT scans for a couple years following something on right side head/repeat sinusitis.  Doesn't think she has sinus polyps.  Running hot, headache, swollen left upper gums and roof of mouth.  Denied fever/chills/n/v/d/cough.  Not much coming out past two days with sinus rinse daily with spray not using neti pot.  Only doing nasal saline daily  "stuck on left side" recently finished a round of prednisone oral for back problems  Has tried 250mg  tylenol po prn also didn't help much doesn't like how it makes her feel.  Great grandchildren have been visiting.     Past Medical History:  Diagnosis Date  . Acid reflux   . Coronary artery disease   . Stroke [facility])     Patient Active Problem List   Diagnosis Date Noted  . Unstable angina ([place]) 01/19/2017  . CAD (coronary artery disease) 01/19/2017    Past Surgical History:  Procedure Laterality Date  . ABDOMINAL HYSTERECTOMY    . BREAST BIOPSY Left 1990's   lt bx x 2-neg  . CARDIAC CATHETERIZATION N/A 01/19/2017   Procedure: Left Heart Cath and Coronary Angiography;  Surgeon: Teodoro Spray, MD;  Location: [facility];  Service: Cardiovascular;  Laterality: N/A;  . CARDIAC CATHETERIZATION N/A 01/19/2017   Procedure: Coronary Stent Intervention;  Surgeon: Isaias Cowman, MD;  Location: [facility];  Service: Cardiovascular;  Laterality: N/A;  . CARDIAC SURGERY    . TONSILLECTOMY      OB History   None      Home Medications    Prior to Admission medications   Medication Sig Start  Date End Date Taking? Authorizing Provider  alum & mag hydroxide-simeth (MYLANTA) 200-200-20 MG/5ML suspension Take 30 mLs by mouth every 6 (six) hours as needed for indigestion or heartburn.   Yes [provider]  alum hydroxide-mag trisilicate (GAVISCON) 75-10 MG CHEW chewable tablet Chew 1 tablet by mouth 5 (five) times daily as needed for indigestion or heartburn.   Yes [provider]  aspirin 81 MG tablet Take 81 mg by mouth daily.   Yes [provider]  cholecalciferol (VITAMIN D) 1000 UNITS tablet Take 1,000 Units by mouth daily.   Yes [provider]  clopidogrel (PLAVIX) 75 MG tablet Take 1 tablet (75 mg total) by mouth daily with breakfast. 01/20/17  Yes Fath, Javier Docker, MD  fenofibrate 160 MG tablet Take 80 mg by mouth daily.   Yes [provider]  omeprazole (PRILOSEC) 20 MG capsule Take 1 capsule (20 mg total) by mouth daily. Take 12 hours apart from plavix 01/20/17  Yes Fath, Javier Docker, MD  sodium chloride (OCEAN) 0.65 % SOLN nasal spray Place 2 sprays into both nostrils every 2 (two) hours while awake. Patient taking differently: Place 2 sprays into both nostrils 2 (two) times daily as needed for congestion.  11/07/15  Yes Dlisa Barnwell, Aura Fey, NP  traMADol (ULTRAM) 50 MG tablet Take 25 mg by mouth every 6 (six) hours as  needed for moderate pain.   Yes [provider]  acetaminophen (TYLENOL) 500 MG tablet Take 2 tablets (1,000 mg total) by mouth every 6 (six) hours as needed for up to 3 days for mild pain or moderate pain. 06/18/18 06/21/18  Damaris Geers, Aura Fey, NP  doxycycline (VIBRAMYCIN) 100 MG capsule Take 1 capsule (100 mg total) by mouth 2 (two) times daily. 06/18/18   Maureen Delatte, Aura Fey, NP  fluticasone (FLONASE) 50 MCG/ACT nasal spray Place 1 spray into both nostrils 2 (two) times daily. 06/18/18 08/17/18  BetancourtAura Fey, NP    Family History Family History  Problem Relation Age of Onset  . Breast cancer Cousin 35       mat  cousin    Social History Social History   Tobacco Use  . Smoking status: Never Smoker  . Smokeless tobacco: Never Used  Substance Use Topics  . Alcohol use: Yes    Comment: Socially  . Drug use: No     Allergies   Amoxicillin and Septra [sulfamethoxazole-trimethoprim]   Review of Systems Review of Systems  Constitutional: Negative for activity change, appetite change, chills, diaphoresis, fatigue, fever and unexpected weight change.  HENT: Positive for congestion, ear pain, sinus pressure and sinus pain. Negative for dental problem, drooling, ear discharge, facial swelling, hearing loss, mouth sores, nosebleeds, postnasal drip, rhinorrhea, sneezing, sore throat, tinnitus, trouble swallowing and voice change.   Eyes: Negative for photophobia, pain, discharge, redness, itching and visual disturbance.  Respiratory: Negative for cough, choking, chest tightness, shortness of breath, wheezing and stridor.   Cardiovascular: Negative for chest pain.  Gastrointestinal: Negative for abdominal pain, blood in stool, diarrhea, nausea and vomiting.  Endocrine: Negative for cold intolerance and heat intolerance.  Genitourinary: Negative for difficulty urinating.  Musculoskeletal: Negative for arthralgias, back pain, gait problem, joint swelling, myalgias, neck pain and neck stiffness.  Skin: Negative for color change, pallor, rash and wound.  Allergic/Immunologic: Positive for environmental allergies. Negative for food allergies.  Neurological: Positive for headaches. Negative for dizziness, tremors, seizures, syncope, facial asymmetry, speech difficulty, weakness, light-headedness and numbness.  Hematological: Negative for adenopathy. Does not bruise/bleed easily.  Psychiatric/Behavioral: Negative for agitation, confusion and sleep disturbance.     Physical Exam Triage Vital Signs ED Triage Vitals [06/18/18 0813]  Enc Vitals Group     BP 127/72     Pulse Rate 97     Resp 17     Temp  98.2 F (36.8 C)     Temp Source Oral     SpO2 98 %     Weight 153 lb (69.4 kg)     Height 5\' 1"  (1.549 m)     Head Circumference      Peak Flow      Pain Score 6     Pain Loc      Pain Edu?      Excl. in [place]?    No data found.  Updated Vital Signs BP 127/72 (BP Location: Left Arm)   Pulse 97   Temp 98.2 F (36.8 C) (Oral)   Resp 17   Ht 5\' 1"  (1.549 m)   Wt 153 lb (69.4 kg)   SpO2 98%   BMI 28.91 kg/m    Physical Exam  Constitutional: She is oriented to person, place, and time. Vital signs are normal. She appears well-developed and well-nourished. She is active and cooperative.  Non-toxic appearance. She does not have a sickly appearance. She appears ill. No distress.  HENT:  Head:  Normocephalic and atraumatic.  Right Ear: Hearing, external ear and ear canal normal. A middle ear effusion is present.  Left Ear: Hearing, external ear and ear canal normal. A middle ear effusion is present.  Nose: Mucosal edema and rhinorrhea present. No nose lacerations, sinus tenderness, nasal deformity, septal deviation or nasal septal hematoma. No epistaxis.  No foreign bodies. Right sinus exhibits maxillary sinus tenderness and frontal sinus tenderness. Left sinus exhibits maxillary sinus tenderness and frontal sinus tenderness.  Mouth/Throat: Uvula is midline and mucous membranes are normal. Mucous membranes are not pale, not dry and not cyanotic. She does not have dentures. No oral lesions. No trismus in the jaw. Normal dentition. No dental abscesses, uvula swelling, lacerations or dental caries. Posterior oropharyngeal edema and posterior oropharyngeal erythema present. No oropharyngeal exudate or tonsillar abscesses. No tonsillar exudate.  Oral swelling hard palate left with macular erythema; maxillary greater than frontal tenderness left greater than right; bilateral TMs air fluid level clear; bilateral allergic shiners; cobblestoning posterior pharynx; nasal turbinates edema/erythema clear  discharge  Eyes: Pupils are equal, round, and reactive to light. Conjunctivae, EOM and lids are normal. Right eye exhibits no chemosis, no discharge, no exudate and no hordeolum. No foreign body present in the right eye. Left eye exhibits no chemosis, no discharge, no exudate and no hordeolum. No foreign body present in the left eye. Right conjunctiva is not injected. Right conjunctiva has no hemorrhage. Left conjunctiva is not injected. Left conjunctiva has no hemorrhage. No scleral icterus. Right eye exhibits normal extraocular motion and no nystagmus. Left eye exhibits normal extraocular motion and no nystagmus. Right pupil is round and reactive. Left pupil is round and reactive. Pupils are equal.  Neck: Trachea normal, normal range of motion and phonation normal. Neck supple. No tracheal tenderness, no spinous process tenderness and no muscular tenderness present. No neck rigidity. No tracheal deviation, no edema, no erythema and normal range of motion present. No thyroid mass and no thyromegaly present.  Cardiovascular: Normal rate, regular rhythm, S1 normal, S2 normal, normal heart sounds and intact distal pulses. PMI is not displaced. Exam reveals no gallop and no friction rub.  No murmur heard. Pulmonary/Chest: Effort normal and breath sounds normal. No accessory muscle usage or stridor. No respiratory distress. She has no decreased breath sounds. She has no wheezes. She has no rhonchi. She has no rales. She exhibits no tenderness.  No cough in exam room spoke full sentences without difficulty  Abdominal: Soft. Normal appearance. She exhibits no distension, no fluid wave and no ascites. There is no rigidity and no guarding.  Musculoskeletal: Normal range of motion. She exhibits no edema or tenderness.       Right shoulder: Normal.       Left shoulder: Normal.       Right elbow: Normal.      Left elbow: Normal.       Right hip: Normal.       Left hip: Normal.       Right knee: Normal.        Left knee: Normal.       Cervical back: Normal.       Thoracic back: Normal.       Lumbar back: Normal.       Right hand: Normal.       Left hand: Normal.  Lymphadenopathy:       Head (right side): No submental, no submandibular, no tonsillar, no preauricular, no posterior auricular and no occipital adenopathy present.  Head (left side): No submental, no submandibular, no tonsillar, no preauricular, no posterior auricular and no occipital adenopathy present.    She has no cervical adenopathy.       Right cervical: No superficial cervical, no deep cervical and no posterior cervical adenopathy present.      Left cervical: No superficial cervical, no deep cervical and no posterior cervical adenopathy present.  Neurological: She is alert and oriented to person, place, and time. She has normal strength. She is not disoriented. She displays no atrophy and no tremor. No cranial nerve deficit or sensory deficit. She exhibits normal muscle tone. She displays no seizure activity. Coordination and gait normal. GCS eye subscore is 4. GCS verbal subscore is 5. GCS motor subscore is 6.  In/out of chair without difficulty; gait sure and steady in hallway  Skin: Skin is warm, dry and intact. Capillary refill takes less than 2 seconds. No abrasion, no bruising, no burn, no ecchymosis, no laceration, no lesion, no petechiae and no rash noted. She is not diaphoretic. No cyanosis or erythema. No pallor. Nails show no clubbing.  Psychiatric: She has a normal mood and affect. Her speech is normal and behavior is normal. Judgment and thought content normal. She is not actively hallucinating. Cognition and memory are normal. She is attentive.  Nursing note and vitals reviewed.    UC Treatments / Results  Labs (all labs ordered are listed, but only abnormal results are displayed) Labs Reviewed - No data to display  EKG None  Radiology No results found.  Procedures Procedures (including critical care  time)  Medications Ordered in UC Medications - No data to display  Initial Impression / Assessment and Plan / UC Course  I have reviewed the triage vital signs and the nursing notes.  Pertinent labs & imaging results that were available during my care of the patient were reviewed by me and considered in my medical decision making (see chart for details).     Change  flonase to 1 spray each nostril BID, increase saline 2 sprays each nostril q2h wa prn congestion at least TID.  Doxycycline 100mg  po BID x 10 days #20 RF0   Electronic Rx given. Wear protective clothing/sunscreen.  May follow up with Dr Kathyrn Sheriff if another sinusitis after this episode as would be 3 for the year. Denied personal or family history of ENT cancer.  Shower BID especially prior to bed. No evidence of systemic bacterial infection, non toxic and well hydrated.  I do not see where any further testing or imaging is necessary at this time.   I will suggest supportive care, rest, good hygiene and encourage the patient to take adequate fluids.  The patient is to return to clinic or EMERGENCY ROOM if symptoms worsen or change significantly.  Exitcare handout on sinusitis and sinus rinse given to patient.  Patient verbalized agreement and understanding of treatment plan and had no further questions at this time.   P2:  Hand washing and cover cough Final Clinical Impressions(s) / UC Diagnoses   Final diagnoses:  Acute recurrent maxillary sinusitis   Discharge Instructions   None    ED Prescriptions    Medication Sig Dispense Auth. Provider   doxycycline (VIBRAMYCIN) 100 MG capsule Take 1 capsule (100 mg total) by mouth 2 (two) times daily. 20 capsule Kenneisha Cochrane A, NP   acetaminophen (TYLENOL) 500 MG tablet Take 2 tablets (1,000 mg total) by mouth every 6 (six) hours as needed for up to 3 days for  mild pain or moderate pain. 8 tablet Aibhlinn Kalmar A, NP   fluticasone (FLONASE) 50 MCG/ACT nasal spray Place 1 spray into  both nostrils 2 (two) times daily. 16 g Summer Mccolgan, Aura Fey, NP     Controlled Substance Prescriptions [place] Controlled Substance Registry consulted? Not Applicable   Olen Cordial, NP 06/18/18 [phone]

## 2018-07-10 DIAGNOSIS — I2510 Atherosclerotic heart disease of native coronary artery without angina pectoris: Secondary | ICD-10-CM | POA: Diagnosis not present

## 2018-07-10 DIAGNOSIS — R002 Palpitations: Secondary | ICD-10-CM | POA: Diagnosis not present

## 2018-07-10 DIAGNOSIS — E7800 Pure hypercholesterolemia, unspecified: Secondary | ICD-10-CM | POA: Diagnosis not present

## 2018-07-10 DIAGNOSIS — G459 Transient cerebral ischemic attack, unspecified: Secondary | ICD-10-CM | POA: Diagnosis not present

## 2018-07-10 DIAGNOSIS — I10 Essential (primary) hypertension: Secondary | ICD-10-CM | POA: Diagnosis not present

## 2018-08-02 ENCOUNTER — Other Ambulatory Visit: Payer: Self-pay | Admitting: Student

## 2018-08-02 ENCOUNTER — Ambulatory Visit
Admission: RE | Admit: 2018-08-02 | Discharge: 2018-08-02 | Disposition: A | Discharge status: Home / Self Care | Payer: Medicare Other | Source: Ambulatory Visit | Attending: Student | Admitting: Student

## 2018-08-02 DIAGNOSIS — K625 Hemorrhage of anus and rectum: Secondary | ICD-10-CM | POA: Diagnosis not present

## 2018-08-02 DIAGNOSIS — Z8719 Personal history of other diseases of the digestive system: Secondary | ICD-10-CM

## 2018-08-02 DIAGNOSIS — R102 Pelvic and perineal pain: Secondary | ICD-10-CM

## 2018-08-02 DIAGNOSIS — R1032 Left lower quadrant pain: Secondary | ICD-10-CM

## 2018-08-02 DIAGNOSIS — K5730 Diverticulosis of large intestine without perforation or abscess without bleeding: Secondary | ICD-10-CM | POA: Diagnosis not present

## 2018-08-02 MED ORDER — IOHEXOL 300 MG/ML  SOLN
100.0000 mL | Freq: Once | INTRAMUSCULAR | Status: AC | PRN
  Administered 2018-08-02: 100 mL via INTRAVENOUS

## 2018-08-29 IMAGING — MG DIGITAL SCREENING BILATERAL MAMMOGRAM WITH CAD
4 series · 4 of 4 positions shown · non-contrast
Comparison: Previous exam(s).

CLINICAL DATA: Screening.

EXAM:
DIGITAL SCREENING BILATERAL MAMMOGRAM WITH CAD

[R CC]
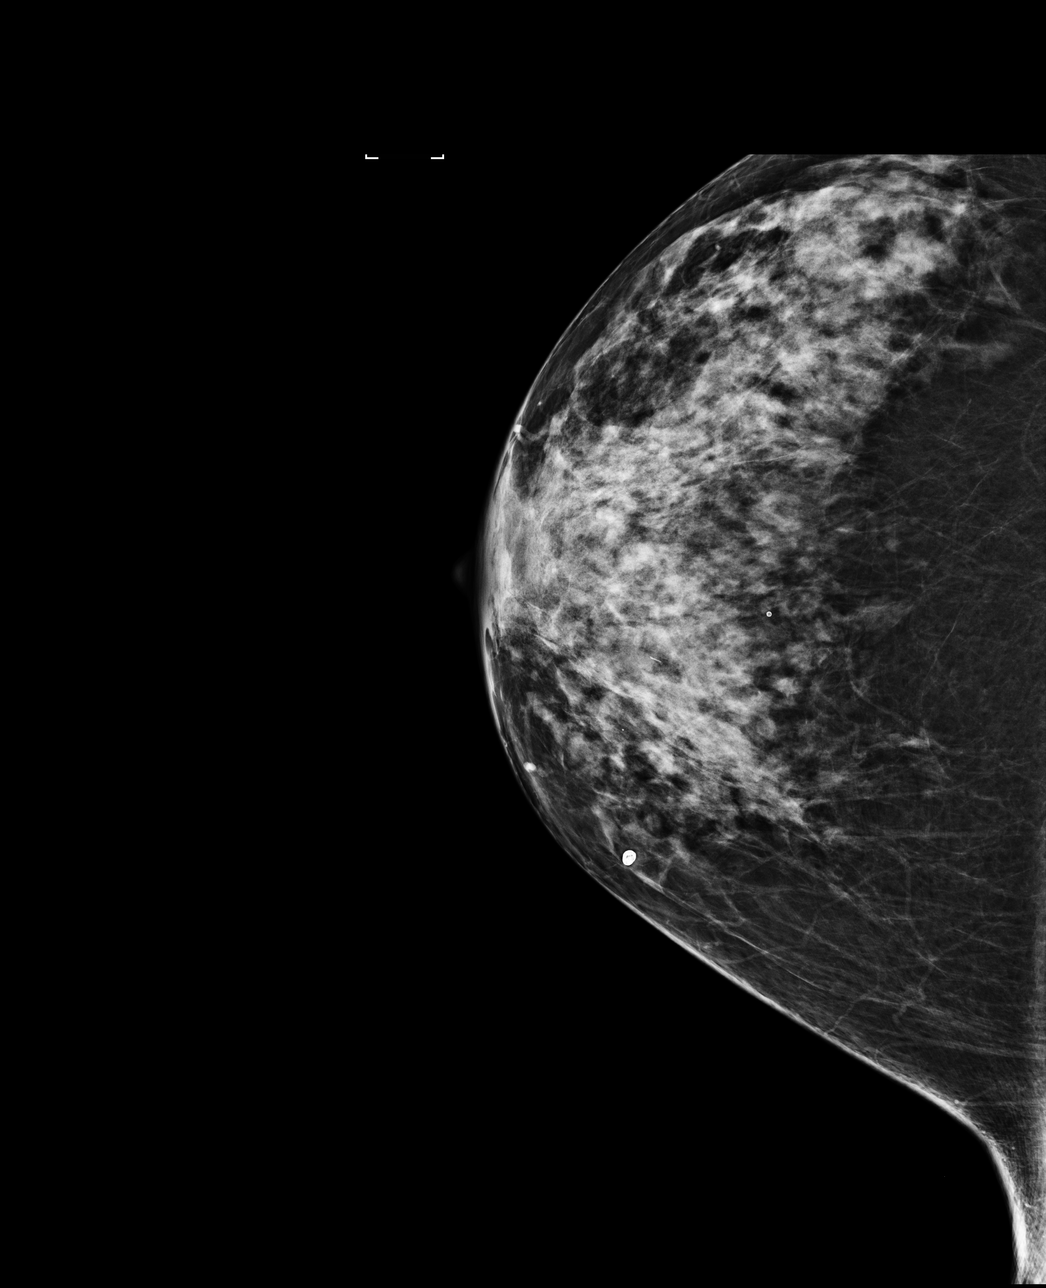

[R MLO]
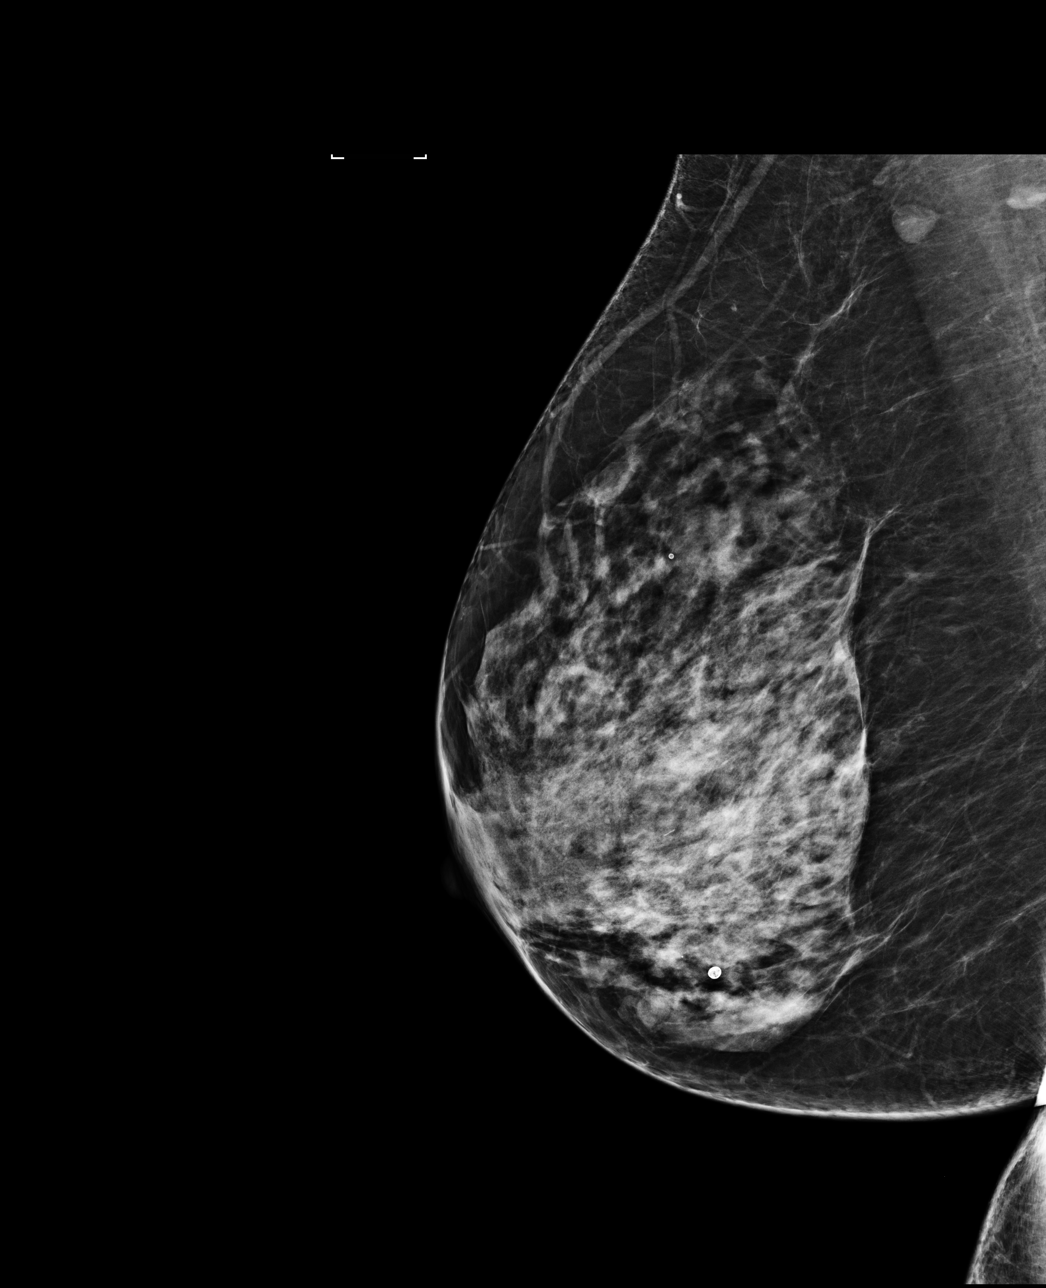

[L MLO]
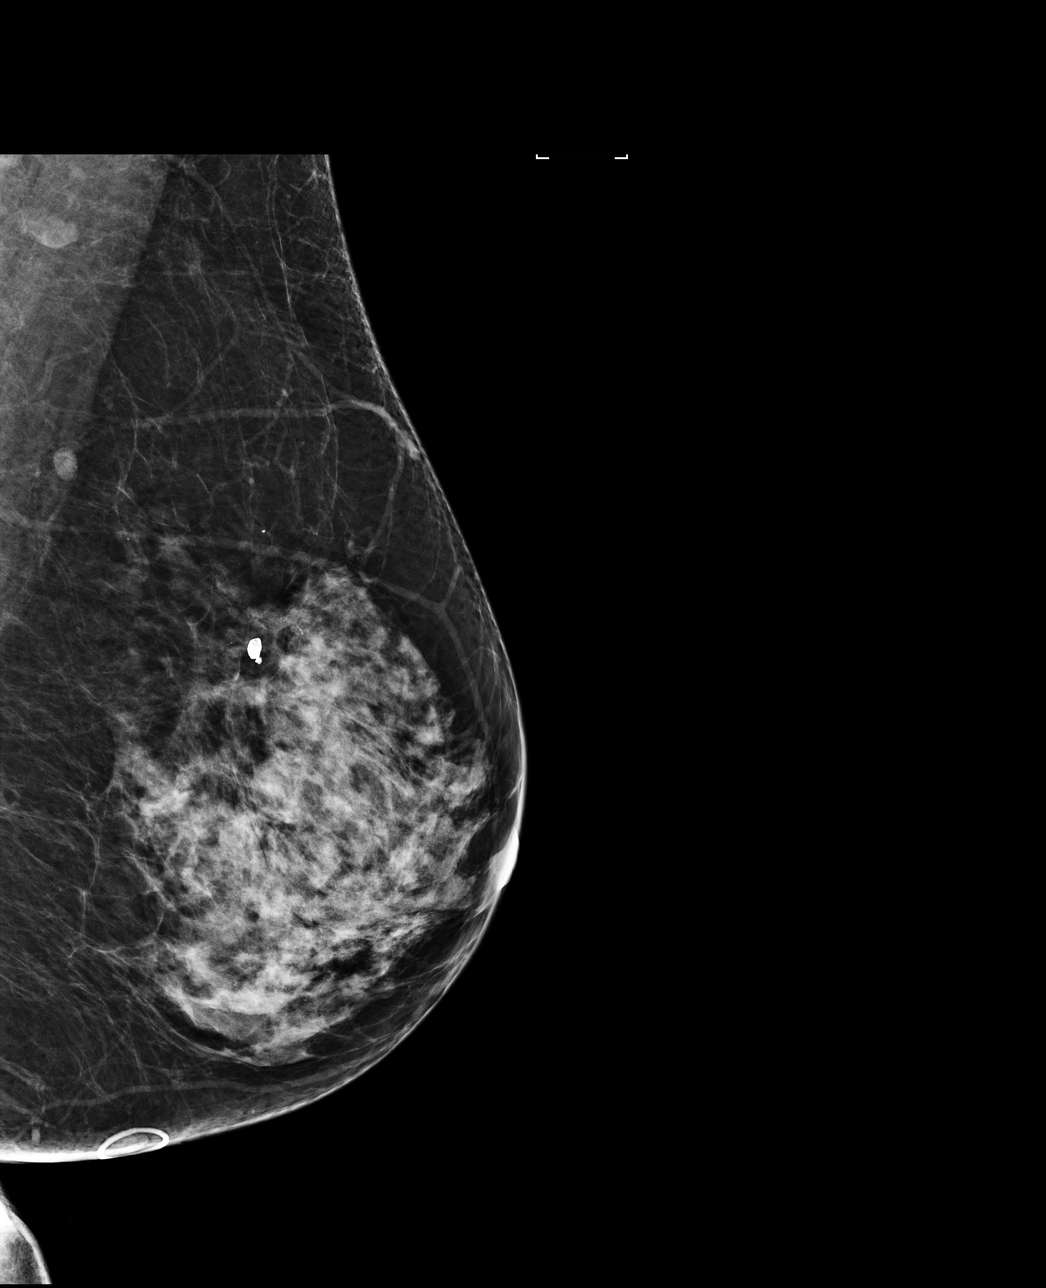

[L CC]
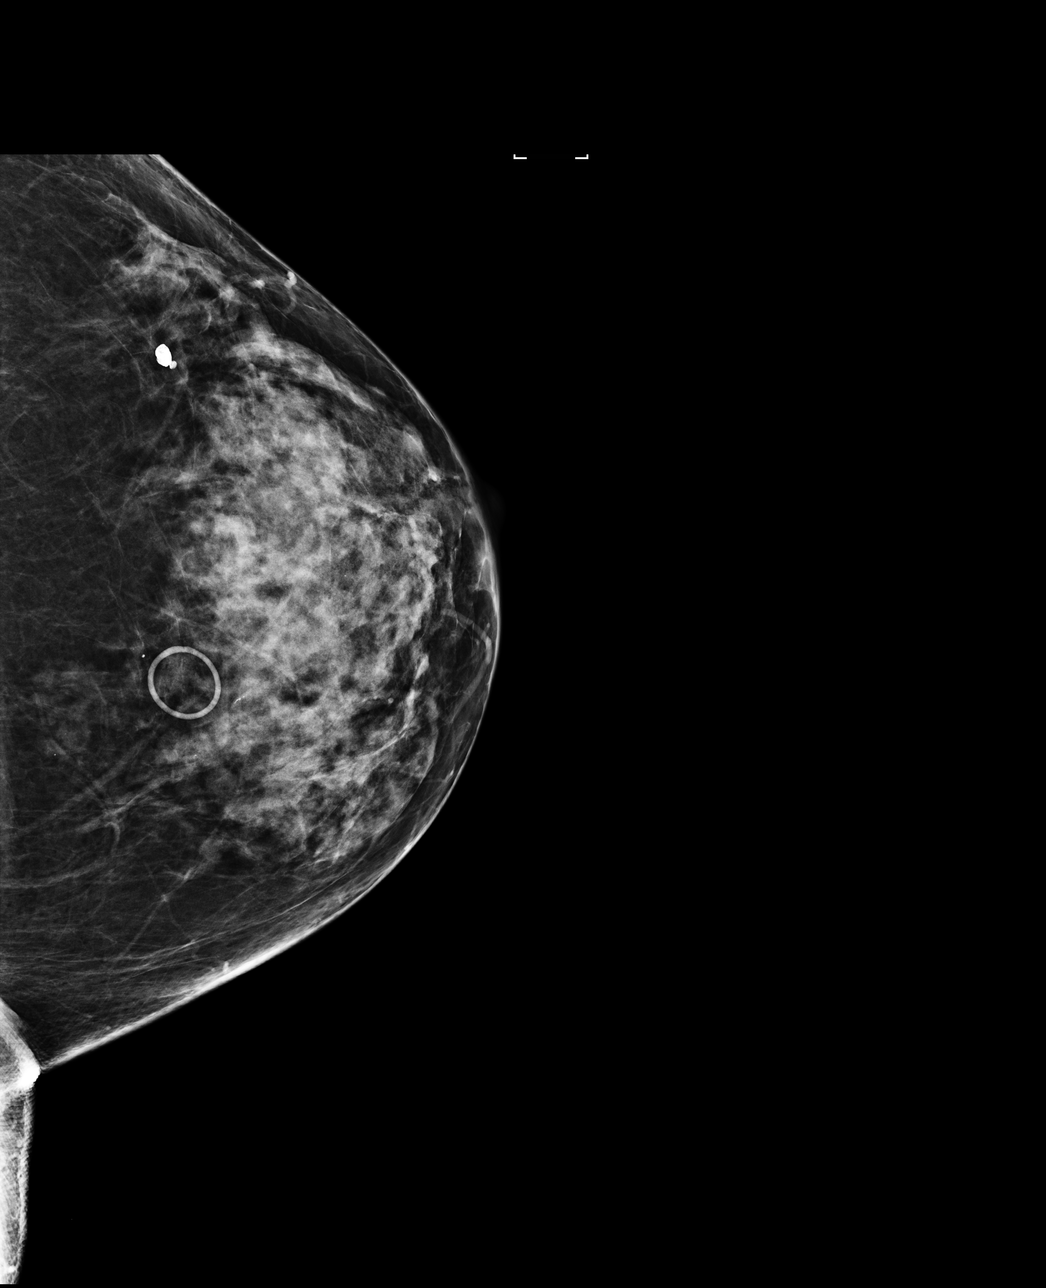

[4 of 4 positions shown; findings below may reference images not displayed]

ACR Breast Density Category c: The breast tissue is heterogeneously
dense, which may obscure small masses.
FINDINGS: There are no findings suspicious for malignancy. Images were
processed with CAD.
IMPRESSION: No mammographic evidence of malignancy. A result letter of this
screening mammogram will be mailed directly to the patient.

RECOMMENDATION:
Screening mammogram in one year. (Code:YJ-2-FEZ)

BI-RADS CATEGORY  1: Negative.

## 2018-09-01 DIAGNOSIS — L814 Other melanin hyperpigmentation: Secondary | ICD-10-CM | POA: Diagnosis not present

## 2018-09-01 DIAGNOSIS — X32XXXA Exposure to sunlight, initial encounter: Secondary | ICD-10-CM | POA: Diagnosis not present

## 2018-09-01 DIAGNOSIS — L821 Other seborrheic keratosis: Secondary | ICD-10-CM | POA: Diagnosis not present

## 2018-09-12 DIAGNOSIS — Z23 Encounter for immunization: Secondary | ICD-10-CM | POA: Diagnosis not present

## 2018-10-03 DIAGNOSIS — M7582 Other shoulder lesions, left shoulder: Secondary | ICD-10-CM | POA: Diagnosis not present

## 2018-10-12 DIAGNOSIS — E7800 Pure hypercholesterolemia, unspecified: Secondary | ICD-10-CM | POA: Diagnosis not present

## 2018-10-12 DIAGNOSIS — I2510 Atherosclerotic heart disease of native coronary artery without angina pectoris: Secondary | ICD-10-CM | POA: Diagnosis not present

## 2018-10-12 DIAGNOSIS — I10 Essential (primary) hypertension: Secondary | ICD-10-CM | POA: Diagnosis not present

## 2018-10-20 DIAGNOSIS — M170 Bilateral primary osteoarthritis of knee: Secondary | ICD-10-CM | POA: Diagnosis not present

## 2018-10-20 DIAGNOSIS — E7800 Pure hypercholesterolemia, unspecified: Secondary | ICD-10-CM | POA: Diagnosis not present

## 2018-10-20 DIAGNOSIS — Z1239 Encounter for other screening for malignant neoplasm of breast: Secondary | ICD-10-CM | POA: Diagnosis not present

## 2018-10-20 DIAGNOSIS — I10 Essential (primary) hypertension: Secondary | ICD-10-CM | POA: Diagnosis not present

## 2018-10-20 DIAGNOSIS — I2510 Atherosclerotic heart disease of native coronary artery without angina pectoris: Secondary | ICD-10-CM | POA: Diagnosis not present

## 2018-10-20 DIAGNOSIS — M7582 Other shoulder lesions, left shoulder: Secondary | ICD-10-CM | POA: Diagnosis not present

## 2018-10-20 DIAGNOSIS — Z1231 Encounter for screening mammogram for malignant neoplasm of breast: Secondary | ICD-10-CM | POA: Diagnosis not present

## 2018-10-21 DIAGNOSIS — Z880 Allergy status to penicillin: Secondary | ICD-10-CM | POA: Diagnosis not present

## 2018-10-21 DIAGNOSIS — I10 Essential (primary) hypertension: Secondary | ICD-10-CM | POA: Diagnosis not present

## 2018-10-21 DIAGNOSIS — Z7982 Long term (current) use of aspirin: Secondary | ICD-10-CM | POA: Diagnosis not present

## 2018-10-21 DIAGNOSIS — Z881 Allergy status to other antibiotic agents status: Secondary | ICD-10-CM | POA: Diagnosis not present

## 2018-10-21 DIAGNOSIS — S82831A Other fracture of upper and lower end of right fibula, initial encounter for closed fracture: Secondary | ICD-10-CM | POA: Diagnosis not present

## 2018-10-21 DIAGNOSIS — S0990XA Unspecified injury of head, initial encounter: Secondary | ICD-10-CM | POA: Diagnosis not present

## 2018-10-21 DIAGNOSIS — Z7902 Long term (current) use of antithrombotics/antiplatelets: Secondary | ICD-10-CM | POA: Diagnosis not present

## 2018-10-21 DIAGNOSIS — S199XXA Unspecified injury of neck, initial encounter: Secondary | ICD-10-CM | POA: Diagnosis not present

## 2018-10-21 DIAGNOSIS — S01112A Laceration without foreign body of left eyelid and periocular area, initial encounter: Secondary | ICD-10-CM | POA: Diagnosis not present

## 2018-10-21 DIAGNOSIS — Z955 Presence of coronary angioplasty implant and graft: Secondary | ICD-10-CM | POA: Diagnosis not present

## 2018-10-21 DIAGNOSIS — E785 Hyperlipidemia, unspecified: Secondary | ICD-10-CM | POA: Diagnosis not present

## 2018-10-21 DIAGNOSIS — Z90710 Acquired absence of both cervix and uterus: Secondary | ICD-10-CM | POA: Diagnosis not present

## 2018-10-21 DIAGNOSIS — M47812 Spondylosis without myelopathy or radiculopathy, cervical region: Secondary | ICD-10-CM | POA: Diagnosis not present

## 2018-10-21 DIAGNOSIS — S92254A Nondisplaced fracture of navicular [scaphoid] of right foot, initial encounter for closed fracture: Secondary | ICD-10-CM | POA: Diagnosis not present

## 2018-10-21 DIAGNOSIS — S82424A Nondisplaced transverse fracture of shaft of right fibula, initial encounter for closed fracture: Secondary | ICD-10-CM | POA: Diagnosis not present

## 2018-10-21 DIAGNOSIS — M5030 Other cervical disc degeneration, unspecified cervical region: Secondary | ICD-10-CM | POA: Diagnosis not present

## 2018-10-21 DIAGNOSIS — I2510 Atherosclerotic heart disease of native coronary artery without angina pectoris: Secondary | ICD-10-CM | POA: Diagnosis not present

## 2018-10-21 DIAGNOSIS — M25571 Pain in right ankle and joints of right foot: Secondary | ICD-10-CM | POA: Diagnosis not present

## 2018-10-21 DIAGNOSIS — Z6828 Body mass index (BMI) 28.0-28.9, adult: Secondary | ICD-10-CM | POA: Diagnosis not present

## 2018-11-16 ENCOUNTER — Other Ambulatory Visit: Payer: Self-pay | Admitting: Internal Medicine

## 2018-11-16 DIAGNOSIS — Z1231 Encounter for screening mammogram for malignant neoplasm of breast: Secondary | ICD-10-CM

## 2018-11-30 ENCOUNTER — Encounter (INDEPENDENT_AMBULATORY_CARE_PROVIDER_SITE_OTHER): Payer: Self-pay

## 2018-11-30 ENCOUNTER — Ambulatory Visit
Admission: RE | Admit: 2018-11-30 | Discharge: 2018-11-30 | Disposition: A | Discharge status: Home / Self Care | Payer: Medicare Other | Source: Ambulatory Visit | Attending: Internal Medicine | Admitting: Internal Medicine

## 2018-11-30 DIAGNOSIS — Z1231 Encounter for screening mammogram for malignant neoplasm of breast: Secondary | ICD-10-CM

## 2018-12-13 ENCOUNTER — Other Ambulatory Visit: Payer: Self-pay | Admitting: Student

## 2018-12-13 DIAGNOSIS — R1032 Left lower quadrant pain: Secondary | ICD-10-CM | POA: Diagnosis not present

## 2018-12-13 DIAGNOSIS — Z8719 Personal history of other diseases of the digestive system: Secondary | ICD-10-CM

## 2018-12-14 ENCOUNTER — Other Ambulatory Visit
Admission: RE | Admit: 2018-12-14 | Discharge: 2018-12-14 | Disposition: A | Discharge status: Home / Self Care | Payer: Medicare Other | Source: Home / Self Care | Attending: Student | Admitting: Student

## 2018-12-14 ENCOUNTER — Ambulatory Visit
Admission: RE | Admit: 2018-12-14 | Discharge: 2018-12-14 | Disposition: A | Discharge status: Home / Self Care | Payer: Medicare Other | Source: Ambulatory Visit | Attending: Student | Admitting: Student

## 2018-12-14 DIAGNOSIS — Z8719 Personal history of other diseases of the digestive system: Secondary | ICD-10-CM | POA: Insufficient documentation

## 2018-12-14 DIAGNOSIS — R1032 Left lower quadrant pain: Secondary | ICD-10-CM | POA: Insufficient documentation

## 2018-12-14 DIAGNOSIS — K5732 Diverticulitis of large intestine without perforation or abscess without bleeding: Secondary | ICD-10-CM | POA: Diagnosis not present

## 2018-12-14 LAB — CREATININE, SERUM
Creatinine, Ser: 0.95 mg/dL (ref 0.44–1.00)
GFR, EST NON AFRICAN AMERICAN: 57 mL/min — AB (ref >60)

## 2018-12-14 MED ORDER — IOPAMIDOL (ISOVUE-300) INJECTION 61%
100.0000 mL | Freq: Once | INTRAVENOUS | Status: AC | PRN
  Administered 2018-12-14: 100 mL via INTRAVENOUS

## 2018-12-21 DIAGNOSIS — K5732 Diverticulitis of large intestine without perforation or abscess without bleeding: Secondary | ICD-10-CM | POA: Diagnosis not present

## 2019-01-01 ENCOUNTER — Other Ambulatory Visit: Payer: Self-pay | Admitting: Student

## 2019-01-01 DIAGNOSIS — Q438 Other specified congenital malformations of intestine: Secondary | ICD-10-CM

## 2019-01-01 DIAGNOSIS — Z8719 Personal history of other diseases of the digestive system: Secondary | ICD-10-CM

## 2019-01-18 DIAGNOSIS — I10 Essential (primary) hypertension: Secondary | ICD-10-CM | POA: Diagnosis not present

## 2019-01-18 DIAGNOSIS — R002 Palpitations: Secondary | ICD-10-CM | POA: Diagnosis not present

## 2019-01-18 DIAGNOSIS — I2510 Atherosclerotic heart disease of native coronary artery without angina pectoris: Secondary | ICD-10-CM | POA: Diagnosis not present

## 2019-01-18 DIAGNOSIS — G459 Transient cerebral ischemic attack, unspecified: Secondary | ICD-10-CM | POA: Diagnosis not present

## 2019-01-18 DIAGNOSIS — E7800 Pure hypercholesterolemia, unspecified: Secondary | ICD-10-CM | POA: Diagnosis not present

## 2019-02-27 ENCOUNTER — Other Ambulatory Visit: Payer: Self-pay | Admitting: Student

## 2019-02-27 DIAGNOSIS — R1032 Left lower quadrant pain: Principal | ICD-10-CM

## 2019-02-27 DIAGNOSIS — R1031 Right lower quadrant pain: Secondary | ICD-10-CM | POA: Diagnosis not present

## 2019-02-27 DIAGNOSIS — Z8719 Personal history of other diseases of the digestive system: Secondary | ICD-10-CM | POA: Diagnosis not present

## 2019-02-28 ENCOUNTER — Ambulatory Visit
Admission: RE | Admit: 2019-02-28 | Discharge: 2019-02-28 | Disposition: A | Discharge status: Home / Self Care | Payer: Medicare Other | Source: Ambulatory Visit | Attending: Student | Admitting: Student

## 2019-02-28 ENCOUNTER — Other Ambulatory Visit: Payer: Self-pay

## 2019-02-28 DIAGNOSIS — R1031 Right lower quadrant pain: Secondary | ICD-10-CM | POA: Insufficient documentation

## 2019-02-28 DIAGNOSIS — R1032 Left lower quadrant pain: Secondary | ICD-10-CM | POA: Diagnosis not present

## 2019-02-28 DIAGNOSIS — K5900 Constipation, unspecified: Secondary | ICD-10-CM | POA: Diagnosis not present

## 2019-02-28 MED ORDER — IOHEXOL 300 MG/ML  SOLN
85.0000 mL | Freq: Once | INTRAMUSCULAR | Status: AC | PRN
  Administered 2019-02-28: 85 mL via INTRAVENOUS

## 2019-03-07 ENCOUNTER — Ambulatory Visit
Admission: RE | Admit: 2019-03-07 | Discharge: 2019-03-07 | Disposition: A | Discharge status: Home / Self Care | Payer: Medicare Other | Source: Ambulatory Visit | Attending: Student | Admitting: Student

## 2019-03-07 DIAGNOSIS — K5730 Diverticulosis of large intestine without perforation or abscess without bleeding: Secondary | ICD-10-CM | POA: Diagnosis not present

## 2019-03-07 DIAGNOSIS — Z8719 Personal history of other diseases of the digestive system: Secondary | ICD-10-CM

## 2019-03-07 DIAGNOSIS — Q438 Other specified congenital malformations of intestine: Secondary | ICD-10-CM

## 2019-03-21 DIAGNOSIS — K5792 Diverticulitis of intestine, part unspecified, without perforation or abscess without bleeding: Secondary | ICD-10-CM | POA: Diagnosis not present

## 2019-04-04 ENCOUNTER — Encounter: Payer: Self-pay | Admitting: Emergency Medicine

## 2019-04-04 ENCOUNTER — Ambulatory Visit
Admission: EM | Admit: 2019-04-04 | Discharge: 2019-04-04 | Disposition: A | Discharge status: Home / Self Care | Payer: Medicare Other | Attending: Family Medicine | Admitting: Family Medicine

## 2019-04-04 ENCOUNTER — Other Ambulatory Visit: Payer: Self-pay

## 2019-04-04 DIAGNOSIS — J0100 Acute maxillary sinusitis, unspecified: Secondary | ICD-10-CM

## 2019-04-04 HISTORY — DX: Diverticulitis of intestine, part unspecified, without perforation or abscess without bleeding: K57.92

## 2019-04-04 MED ORDER — DOXYCYCLINE HYCLATE 100 MG PO CAPS: 100.0000 mg | ORAL_CAPSULE | Freq: Two times a day (BID) | ORAL | 0 refills | Status: DC

## 2019-04-04 NOTE — ED Triage Notes (Signed)
Patient states she has developed sinus symptoms over the weekend .  States she is having some facial pain and her teeth hurt.  Patient states she feels like this is a bad sinus infection starting.

## 2019-04-04 NOTE — ED Provider Notes (Signed)
[facility]    CSN: [id number] Arrival date & time: 04/04/19  1015     History   Chief Complaint Chief Complaint  Patient presents with  . Facial Pain    HPI Kristen Lloyd is a 78 y.o. female.   78 yo female with a h/o sinus infection, presents with maxillary sinus pressure/headache, upper teeth hurting for the past 6 days. Denies any fevers, chills, cough.   The history is provided by the patient.    Past Medical History:  Diagnosis Date  . Acid reflux   . Coronary artery disease   . Diverticulitis   . Stroke [facility])     Patient Active Problem List   Diagnosis Date Noted  . Unstable angina ([place]) 01/19/2017  . CAD (coronary artery disease) 01/19/2017    Past Surgical History:  Procedure Laterality Date  . ABDOMINAL HYSTERECTOMY    . BREAST BIOPSY Left 1990's   lt bx x 2-neg  . CARDIAC CATHETERIZATION N/A 01/19/2017   Procedure: Left Heart Cath and Coronary Angiography;  Surgeon: Teodoro Spray, MD;  Location: [facility];  Service: Cardiovascular;  Laterality: N/A;  . CARDIAC CATHETERIZATION N/A 01/19/2017   Procedure: Coronary Stent Intervention;  Surgeon: Isaias Cowman, MD;  Location: [facility];  Service: Cardiovascular;  Laterality: N/A;  . CARDIAC SURGERY    . TONSILLECTOMY      OB History   No obstetric history on file.      Home Medications    Prior to Admission medications   Medication Sig Start Date End Date Taking? Authorizing Provider  alum hydroxide-mag trisilicate (GAVISCON) 15-17 MG CHEW chewable tablet Chew 1 tablet by mouth 5 (five) times daily as needed for indigestion or heartburn.   Yes [provider]  aspirin 81 MG tablet Take 81 mg by mouth daily.   Yes [provider]  cholecalciferol (VITAMIN D) 1000 UNITS tablet Take 1,000 Units by mouth daily.   Yes [provider]  clopidogrel (PLAVIX) 75 MG tablet Take 1 tablet (75 mg total) by mouth daily with breakfast. 01/20/17   Yes Fath, Javier Docker, MD  fenofibrate 160 MG tablet Take 80 mg by mouth daily.   Yes [provider]  omeprazole (PRILOSEC) 20 MG capsule Take 1 capsule (20 mg total) by mouth daily. Take 12 hours apart from plavix 01/20/17  Yes Fath, Javier Docker, MD  traMADol (ULTRAM) 50 MG tablet Take 25 mg by mouth every 6 (six) hours as needed for moderate pain.   Yes [provider]  alum & mag hydroxide-simeth (MYLANTA) 200-200-20 MG/5ML suspension Take 30 mLs by mouth every 6 (six) hours as needed for indigestion or heartburn.    [provider]  doxycycline (VIBRAMYCIN) 100 MG capsule Take 1 capsule (100 mg total) by mouth 2 (two) times daily. 04/04/19   Norval Gable, MD  fluticasone (FLONASE) 50 MCG/ACT nasal spray Place 1 spray into both nostrils 2 (two) times daily. 06/18/18 08/17/18  Betancourt, Aura Fey, NP  sodium chloride (OCEAN) 0.65 % SOLN nasal spray Place 2 sprays into both nostrils every 2 (two) hours while awake. Patient taking differently: Place 2 sprays into both nostrils 2 (two) times daily as needed for congestion.  11/07/15   Betancourt, Aura Fey, NP    Family History Family History  Problem Relation Age of Onset  . Breast cancer Cousin 22       mat cousin    Social History Social History   Tobacco Use  .  Smoking status: Never Smoker  . Smokeless tobacco: Never Used  Substance Use Topics  . Alcohol use: Yes    Comment: Socially  . Drug use: No     Allergies   Amoxicillin and Septra [sulfamethoxazole-trimethoprim]   Review of Systems Review of Systems   Physical Exam Triage Vital Signs ED Triage Vitals  Enc Vitals Group     BP 04/04/19 1058 140/86     Pulse Rate 04/04/19 1058 79     Resp 04/04/19 1058 16     Temp 04/04/19 1058 99.1 F (37.3 C)     Temp Source 04/04/19 1058 Oral     SpO2 04/04/19 1058 98 %     Weight 04/04/19 1051 150 lb (68 kg)     Height 04/04/19 1051 5\' 1"  (1.549 m)     Head Circumference --      Peak Flow --      Pain  Score 04/04/19 1050 7     Pain Loc --      Pain Edu? --      Excl. in [place]? --    No data found.  Updated Vital Signs BP 140/86 (BP Location: Left Arm)   Pulse 79   Temp 99.1 F (37.3 C) (Oral)   Resp 16   Ht 5\' 1"  (1.549 m)   Wt 68 kg   SpO2 98%   BMI 28.34 kg/m   Visual Acuity Right Eye Distance:   Left Eye Distance:   Bilateral Distance:    Right Eye Near:   Left Eye Near:    Bilateral Near:     Physical Exam Vitals signs and nursing note reviewed.  Constitutional:      General: She is not in acute distress.    Appearance: She is well-developed. She is not toxic-appearing or diaphoretic.  HENT:     Head: Normocephalic.     Nose: Congestion present.  Neck:     Thyroid: No thyromegaly.  Cardiovascular:     Rate and Rhythm: Normal rate.  Pulmonary:     Effort: Pulmonary effort is normal. No respiratory distress.  Neurological:     Mental Status: She is alert.      UC Treatments / Results  Labs (all labs ordered are listed, but only abnormal results are displayed) Labs Reviewed - No data to display  EKG None  Radiology No results found.  Procedures Procedures (including critical care time)  Medications Ordered in UC Medications - No data to display  Initial Impression / Assessment and Plan / UC Course  I have reviewed the triage vital signs and the nursing notes.  Pertinent labs & imaging results that were available during my care of the patient were reviewed by me and considered in my medical decision making (see chart for details).      Final Clinical Impressions(s) / UC Diagnoses   Final diagnoses:  Acute maxillary sinusitis, recurrence not specified    ED Prescriptions    Medication Sig Dispense Auth. Provider   doxycycline (VIBRAMYCIN) 100 MG capsule Take 1 capsule (100 mg total) by mouth 2 (two) times daily. 20 capsule Norval Gable, MD     1. diagnosis reviewed with patient 2. rx as per orders above; reviewed possible side  effects, interactions, risks and benefits  3. Recommend supportive treatment with otc flonase   4. Follow-up prn if symptoms worsen or don't improve Controlled Substance Prescriptions [place] Controlled Substance Registry consulted? Not Applicable   Norval Gable, MD 04/04/19 1121

## 2019-04-05 DIAGNOSIS — K5792 Diverticulitis of intestine, part unspecified, without perforation or abscess without bleeding: Secondary | ICD-10-CM | POA: Diagnosis not present

## 2019-04-20 DIAGNOSIS — I10 Essential (primary) hypertension: Secondary | ICD-10-CM | POA: Diagnosis not present

## 2019-04-24 DIAGNOSIS — E7800 Pure hypercholesterolemia, unspecified: Secondary | ICD-10-CM | POA: Diagnosis not present

## 2019-04-24 DIAGNOSIS — G459 Transient cerebral ischemic attack, unspecified: Secondary | ICD-10-CM | POA: Diagnosis not present

## 2019-04-24 DIAGNOSIS — I2510 Atherosclerotic heart disease of native coronary artery without angina pectoris: Secondary | ICD-10-CM | POA: Diagnosis not present

## 2019-04-24 DIAGNOSIS — Z0000 Encounter for general adult medical examination without abnormal findings: Secondary | ICD-10-CM | POA: Diagnosis not present

## 2019-04-24 DIAGNOSIS — I10 Essential (primary) hypertension: Secondary | ICD-10-CM | POA: Diagnosis not present

## 2019-05-10 DIAGNOSIS — R197 Diarrhea, unspecified: Secondary | ICD-10-CM | POA: Diagnosis not present

## 2019-07-03 DIAGNOSIS — H2513 Age-related nuclear cataract, bilateral: Secondary | ICD-10-CM | POA: Diagnosis not present

## 2019-07-04 DIAGNOSIS — E7800 Pure hypercholesterolemia, unspecified: Secondary | ICD-10-CM | POA: Diagnosis not present

## 2019-07-04 DIAGNOSIS — R002 Palpitations: Secondary | ICD-10-CM | POA: Diagnosis not present

## 2019-07-04 DIAGNOSIS — I10 Essential (primary) hypertension: Secondary | ICD-10-CM | POA: Diagnosis not present

## 2019-07-04 DIAGNOSIS — G459 Transient cerebral ischemic attack, unspecified: Secondary | ICD-10-CM | POA: Diagnosis not present

## 2019-07-04 DIAGNOSIS — I2510 Atherosclerotic heart disease of native coronary artery without angina pectoris: Secondary | ICD-10-CM | POA: Diagnosis not present

## 2019-09-12 DIAGNOSIS — Z23 Encounter for immunization: Secondary | ICD-10-CM | POA: Diagnosis not present

## 2019-10-09 DIAGNOSIS — M1712 Unilateral primary osteoarthritis, left knee: Secondary | ICD-10-CM | POA: Diagnosis not present

## 2019-10-16 DIAGNOSIS — E7800 Pure hypercholesterolemia, unspecified: Secondary | ICD-10-CM | POA: Diagnosis not present

## 2019-10-16 DIAGNOSIS — I10 Essential (primary) hypertension: Secondary | ICD-10-CM | POA: Diagnosis not present

## 2019-10-25 DIAGNOSIS — E785 Hyperlipidemia, unspecified: Secondary | ICD-10-CM | POA: Diagnosis not present

## 2019-10-25 DIAGNOSIS — M170 Bilateral primary osteoarthritis of knee: Secondary | ICD-10-CM | POA: Diagnosis not present

## 2019-10-25 DIAGNOSIS — E02 Subclinical iodine-deficiency hypothyroidism: Secondary | ICD-10-CM | POA: Diagnosis not present

## 2019-10-25 DIAGNOSIS — I2510 Atherosclerotic heart disease of native coronary artery without angina pectoris: Secondary | ICD-10-CM | POA: Diagnosis not present

## 2019-10-25 DIAGNOSIS — Z7982 Long term (current) use of aspirin: Secondary | ICD-10-CM | POA: Diagnosis not present

## 2019-10-25 DIAGNOSIS — Z8673 Personal history of transient ischemic attack (TIA), and cerebral infarction without residual deficits: Secondary | ICD-10-CM | POA: Diagnosis not present

## 2019-10-25 DIAGNOSIS — I10 Essential (primary) hypertension: Secondary | ICD-10-CM | POA: Diagnosis not present

## 2019-10-25 DIAGNOSIS — Z8719 Personal history of other diseases of the digestive system: Secondary | ICD-10-CM | POA: Diagnosis not present

## 2019-10-25 DIAGNOSIS — Z7901 Long term (current) use of anticoagulants: Secondary | ICD-10-CM | POA: Diagnosis not present

## 2019-10-25 DIAGNOSIS — Z79899 Other long term (current) drug therapy: Secondary | ICD-10-CM | POA: Diagnosis not present

## 2019-12-04 ENCOUNTER — Other Ambulatory Visit: Payer: Self-pay | Admitting: Infectious Diseases

## 2019-12-04 DIAGNOSIS — Z1231 Encounter for screening mammogram for malignant neoplasm of breast: Secondary | ICD-10-CM

## 2019-12-17 ENCOUNTER — Other Ambulatory Visit: Payer: Self-pay | Admitting: Gastroenterology

## 2019-12-17 DIAGNOSIS — R1033 Periumbilical pain: Secondary | ICD-10-CM

## 2019-12-24 ENCOUNTER — Emergency Department
Admission: EM | Admit: 2019-12-24 | Discharge: 2019-12-24 | Discharge status: Against Medical Advice | Payer: Medicare Other

## 2019-12-24 ENCOUNTER — Other Ambulatory Visit: Payer: Self-pay

## 2019-12-24 ENCOUNTER — Ambulatory Visit
Admission: RE | Admit: 2019-12-24 | Discharge: 2019-12-24 | Disposition: A | Discharge status: Home / Self Care | Payer: Medicare Other | Source: Ambulatory Visit | Attending: Gastroenterology | Admitting: Gastroenterology

## 2019-12-24 DIAGNOSIS — R1033 Periumbilical pain: Secondary | ICD-10-CM | POA: Insufficient documentation

## 2019-12-24 MED ORDER — IOHEXOL 300 MG/ML  SOLN
100.0000 mL | Freq: Once | INTRAMUSCULAR | Status: AC | PRN
  Administered 2019-12-24: 100 mL via INTRAVENOUS

## 2019-12-24 NOTE — ED Notes (Signed)
Pt ambulatory to STAT desk without difficulty or distress noted; pt upset over visitor policy and st leaving to go to "[place] to be seen"

## 2019-12-25 DIAGNOSIS — K5720 Diverticulitis of large intestine with perforation and abscess without bleeding: Secondary | ICD-10-CM | POA: Insufficient documentation

## 2019-12-28 HISTORY — PX: OTHER SURGICAL HISTORY: SHX169

## 2020-01-09 ENCOUNTER — Ambulatory Visit: Payer: Medicare Other

## 2020-06-03 DIAGNOSIS — Z932 Ileostomy status: Secondary | ICD-10-CM | POA: Insufficient documentation

## 2020-07-01 ENCOUNTER — Other Ambulatory Visit: Payer: Self-pay | Admitting: Infectious Diseases

## 2020-07-01 DIAGNOSIS — Z1231 Encounter for screening mammogram for malignant neoplasm of breast: Secondary | ICD-10-CM

## 2021-04-14 ENCOUNTER — Other Ambulatory Visit: Payer: Self-pay | Admitting: Infectious Diseases

## 2021-04-14 DIAGNOSIS — Z1231 Encounter for screening mammogram for malignant neoplasm of breast: Secondary | ICD-10-CM

## 2021-04-27 ENCOUNTER — Ambulatory Visit: Payer: Medicare Other

## 2021-05-05 ENCOUNTER — Other Ambulatory Visit: Payer: Self-pay

## 2021-05-05 ENCOUNTER — Ambulatory Visit
Admission: RE | Admit: 2021-05-05 | Discharge: 2021-05-05 | Disposition: A | Discharge status: Home / Self Care | Payer: Medicare Other | Source: Ambulatory Visit | Attending: Infectious Diseases | Admitting: Infectious Diseases

## 2021-05-05 DIAGNOSIS — Z1231 Encounter for screening mammogram for malignant neoplasm of breast: Secondary | ICD-10-CM | POA: Diagnosis present

## 2021-06-15 ENCOUNTER — Encounter: Payer: Self-pay | Admitting: Physical Therapy

## 2021-06-15 ENCOUNTER — Ambulatory Visit: Payer: Medicare Other | Attending: Family Medicine | Admitting: Physical Therapy

## 2021-06-15 ENCOUNTER — Other Ambulatory Visit: Payer: Self-pay

## 2021-06-15 DIAGNOSIS — M25652 Stiffness of left hip, not elsewhere classified: Secondary | ICD-10-CM | POA: Diagnosis present

## 2021-06-15 DIAGNOSIS — R262 Difficulty in walking, not elsewhere classified: Secondary | ICD-10-CM | POA: Diagnosis present

## 2021-06-15 DIAGNOSIS — M25552 Pain in left hip: Secondary | ICD-10-CM

## 2021-06-15 DIAGNOSIS — M6281 Muscle weakness (generalized): Secondary | ICD-10-CM | POA: Insufficient documentation

## 2021-06-15 NOTE — Therapy (Signed)
[facility] [address]. [place], [place], [address] Phone: [phone]   Fax:  [phone]  Physical Therapy Evaluation  Patient Details  Name: Kristen Lloyd MRN: [id number] Date of Birth: 1941/06/23 Referring Provider (PT): Allene Dillon, FNP  Encounter Date: 06/15/2021   PT End of Session - 06/16/21 1746     Visit Number 1    Number of Visits 17    Date for PT Re-Evaluation 08/11/21    Authorization - Visit Number 1    Authorization - Number of Visits 10    Progress Note Due on Visit 10    PT Start Time 1548    PT Stop Time 1640    PT Time Calculation (min) 52 min    Activity Tolerance Patient tolerated treatment well;Patient limited by pain    Behavior During Therapy [facility] for tasks assessed/performed             Past Medical History:  Diagnosis Date   Acid reflux    Coronary artery disease    Diverticulitis    Stroke [facility])     Past Surgical History:  Procedure Laterality Date   ABDOMINAL HYSTERECTOMY     BREAST BIOPSY Left 1990's   lt bx x 2-neg   CARDIAC CATHETERIZATION N/A 01/19/2017   Procedure: Left Heart Cath and Coronary Angiography;  Surgeon: Teodoro Spray, MD;  Location: [place] [facility];  Service: Cardiovascular;  Laterality: N/A;   CARDIAC CATHETERIZATION N/A 01/19/2017   Procedure: Coronary Stent Intervention;  Surgeon: Isaias Cowman, MD;  Location: [place] [facility];  Service: Cardiovascular;  Laterality: N/A;   CARDIAC SURGERY     Ileostomy and reversal N/A 2021   TONSILLECTOMY      There were no vitals filed for this visit.    Subjective Assessment - 06/16/21 1737     Subjective 80 year-old female with primary complaint of L hip pain    Pertinent History Patient is an 80 year old female with primary complaint of L hip pain. Patient has referring diagnosis of L hip OA. Patient reports about 2 years of hip pain. She reports she was doing senior fitness program at [facility] at the time her symptoms began. She states that the staff noticed a change in her gait pattern and she felt minmal pain. This has since worsened. Patient states she was very sick last year and didn't do as much exercise at the time. Patient had ileostomy and reversal in 2021 with significant GI symptoms - significant recovery time required and patient had notably decreased physical activity. Patient did have steroid injection 05/19/2021 that did provide moderate relief. She feels that later in the day she is getting pain into thoracolumbar region from the effect her condition has on her walking. She states that during the day it "isn't that bad." Patient reports no night pain. Pain is worse in AM. Hx of TIA in 2015. No numbness or tingling. Patient stated goals: to avoid surgery and to walk better.    Limitations Standing;Walking;House hold activities    How long can you stand comfortably? 5-10 minutes    How long can you walk comfortably? 30 minutes    Diagnostic tests L hip radiographs; evidence of OA/degenerative change    Patient Stated Goals To avoid surgery, able to walk better             SUBJECTIVE Chief complaint: L hip pain History: Patient is an 80 year old female with primary complaint of L  hip pain. Patient has referring diagnosis of L hip OA. Patient reports about 2 years of hip pain. She reports she was doing senior fitness program at [facility] at the time her symptoms began. She states that the staff noticed a change in her gait pattern and she felt minmal pain. This has since worsened. Patient states she was very sick last year and didn't do as much exercise at the time. Patient had ileostomy and reversal in 2021 with significant GI symptoms - significant recovery time required and patient had notably decreased physical activity. Patient did have steroid injection 05/19/2021 that did provide moderate relief. She feels that later in the day she is getting pain into  thoracolumbar region from the effect her condition has on her walking. She states that during the day it "isn't that bad." Patient reports no night pain. Pain is worse in AM. Hx of TIA in 2015. No numbness or tingling. Patient stated goals: to avoid surgery and to walk better. Referring Dx: Osteoarthritis of left hip Referring Provider: Allene Dillon, FNP Pain location: L gluteal, L greater trochanter, L groin Pain: Present 0/10, Best 0/10, Worst 8/10: Pain quality: burning Radiating pain: Yes , lateral thigh to knee; this has improved following her injection. Numbness/Tingling: No 24 hour pain behavior: first in AM Aggravating factors: walking, sit to stand, sleeping on L,  Easing factors: sitting, lying How long can you walk: about 30 mins How long can you stand comfortably? About 5-10 minutes History of back injury, pain, surgery, or therapy: Yes, ileostomy and reversal last year, no orthopedic surgeries Follow-up appointment with MD: Yes, 07/29/2021 Imaging: Yes , radiographs on L hip; OA/DJD. Falls in the last 6 months: No  Goals: To avoid surgery, able to walk better  Red flags (bowel/bladder changes, saddle paresthesia, personal history of cancer, chills/fever, night sweats, unrelenting pain, first onset of insidious LBP <20 y/o) Negative   [facility] PT Assessment - 06/16/21 0001       Assessment   Medical Diagnosis Osteoarthritis of left hip    Referring Provider (PT) Allene Dillon, FNP    Onset Date/Surgical Date 06/17/19    Next MD Visit 07/29/2021    Prior Therapy No      Balance Screen   Has the patient fallen in the past 6 months No      [place] residence    Living Arrangements Spouse/significant other      Prior Function   Level of Independence Independent with community mobility without device      Cognition   Overall Cognitive Status Within Functional Limits for tasks assessed                  OBJECTIVE  Mental  Status Patient is oriented to person, place and time.  Recent memory is intact.  Remote memory is intact.  Attention span and concentration are intact.  Expressive speech is intact.  Patient's fund of knowledge is within normal limits for educational level.  SENSATION: Deferred   MUSCULOSKELETAL: Tremor: None Bulk: Normal Tone: Normal   Posture Decreased lumbar lordosis, weight shifted to R hip in sitting and standing, self-selected standing on R forefoot, guarded posture  Gait Antalgic pattern with decreased stance time LLE, decreased step length bilat with lack of heel strike at initial contact, intermittent LOB   Palpation Tenderness to palpation along L distal iliopsoas, L proximal rectus femoris, L greater trochanter, L piriformis/gemelli complex   Strength (out of 5) R/L 4/4  Hip flexion 4/4 Hip ER 4/4- Hip IR Not tested/3+ Hip abduction 5/5 Hip adduction 4+/4+ Knee extension 5/4 Knee flexion 4/4 Ankle dorsiflexion *Indicates pain   AROM (degrees) R/L (all movements include overpressure unless otherwise stated) Lumbar forward flexion (65): 75% * Lumbar extension (30): to neutral * Lumbar lateral flexion (25): R: 25%* L: 25%* Thoracic and Lumbar rotation (30 degrees):  R: 50%* L: 25%* Hip ER (0-45): R: 40, L: 20* Hip Flexion (0-125): R: 110, L 110* Hip Abduction (0-40): R: 40 L: 25 *Indicates pain   PROM (degrees) PROM = AROM R/L  Hip IR (0-45): R: 15 L: 15* Hip ER (0-45): R: 40, L: 20* Hip Flexion (0-125): R: 110, L 110* Hip Abduction (0-40): R: 40 L: 25 Hip extension (0-15): R: not tested L: to neutral * *Indicates pain    SPECIAL TESTS Lumbar Radiculopathy and Discogenic: SLR (SN 92, -LR 0.29): R: Negative L:  Negative Crossed SLR (SP 90): R: Negative L: Negative  Hip: FABER (SN 81): R: Negative L: Positive Hip scour (SN 50): R: Negative L: Positive    ASSESSMENT Clinical Impression: Pt is a pleasant 80 year-old female referred for L  hip pain with referring diagnosis of L hip DJD. Patient presents with activity limitations in ambulation, sit to stand, LLE management during transferring and bed mobility, stair/obstacle negotiation. PT examination reveals deficits in L hip active and passive ROM, thoracolumbar AROM, hip flexor and abductor strength, quad/hamstring and dorsiflexor strength, and local nociceptive L groin and gluteal pain with moderate referral to anterior/lateral thigh. Pt will benefit from skilled PT services to address deficits and return to pain-free function at home and work.      Objective measurements completed on examination: See above findings.     PT Education - 06/16/21 1745     Education Details Patient education on current condition, anatomy involved, prognosis, plan of care. Discussion on activity modification to prevent flare-up of condition and improve safety with functional mobility, including use of single-point cane to improve tolerance of walking and stability. Reviewed HEP - Access Code [phone]    Person(s) Educated Patient    Methods Explanation    Comprehension Verbalized understanding              PT Short Term Goals - 06/16/21 1813       PT SHORT TERM GOAL #1   Title Pt will be independent and 100% compliant with established HEP and activity modification as needed to augment PT intervention and improve pertinent strength and mobility deficits as needed for best return to prior level of function.    Baseline HEP provided at IE 06/15/21    Time 3    Period Weeks    Status New    Target Date 07/06/21      PT SHORT TERM GOAL #2   Title Patient will perform independent sit to stand without UE support and no increase in pain indicative of improved functional LE strength and ability to perform transferring necessary for accessing home and community environment    Baseline Significant difficulty with sit to stand with heavy UE support required    Time 4    Period Weeks    Status  New    Target Date 07/13/21               PT Long Term Goals - 06/16/21 1824       PT LONG TERM GOAL #1   Title Patient will demonstrate improved function as evidenced by  a score of 59 on FOTO measure for full participation in activities at home and in the community.    Baseline 06/15/21: 50    Time 8    Period Weeks    Status New    Target Date 08/10/21      PT LONG TERM GOAL #2   Title Patient will perform consecutive hurdle step over 3 6-inch hurdles with good heel to toe progression, no LOB, no buckling off affected LE, and no increase in pain indicative of improved ability to weight shift to affected LE and clear lower limb over obstacles as needed for functional mobility    Baseline Antalgic gait pattern wth decreased LLE weight shift    Time 8    Period Weeks    Status New    Target Date 08/10/21      PT LONG TERM GOAL #3   Title Patient will improve MMT to 4+/5 for all tested LE musculature indicative of improved strength as needed for improved performance of transferring and as needed to produce power to prevent LOB during episode of large postural perturbation    Baseline MMT 3+ to 4/5 for hip musculature (flexion, ER, IR, ABD), 4/5 for hamstrings and ankle dorsiflexors    Time 8    Period Weeks    Status New    Target Date 08/10/21      PT LONG TERM GOAL #4   Title Patient will ambulate x 300 feet in clinic with no AD with symmetrical weight shift and normal heel to toe progression without LOB and no reproduction of pain indicative of improved community-level gait    Baseline Gait with antalgic pattern and decreased stance time LLE    Time 8    Period Weeks    Status New    Target Date 08/10/21                 Plan - 06/16/21 1800     Clinical Impression Statement Clinical Impression: Pt is a pleasant 80 year-old female referred for L hip pain with referring diagnosis of L hip DJD. Patient presents with activity limitations in ambulation, sit to  stand, LLE management during transferring and bed mobility, stair/obstacle negotiation. PT examination reveals deficits in L hip active and passive ROM, thoracolumbar AROM, hip flexor and abductor strength, quad/hamstring and dorsiflexor strength, and local nociceptive L groin and gluteal pain with moderate referral to anterior/lateral thigh. Pt will benefit from skilled PT services to address deficits and return to pain-free function at home and work.    Personal Factors and Comorbidities Age;Comorbidity 3+;Time since onset of injury/illness/exacerbation    Comorbidities CAD, acid reflux, s/p TIA    Examination-Activity Limitations Bed Mobility;Stairs;Stand;Dressing;Bathing;Bend;Locomotion Level;Squat    Examination-Participation Restrictions Community Activity;Cleaning;Laundry    Stability/Clinical Decision Making Evolving/Moderate complexity    Clinical Decision Making Moderate    Rehab Potential Good    PT Frequency 2x / week    PT Duration 8 weeks    PT Treatment/Interventions ADLs/Self Care Home Management    PT Next Visit Plan Manual therapy with distraction techniques and STM for symptom modulation, hip ROM,    PT Home Exercise Plan Access Code [phone]. Supine Lower Trunk Rotation - 2 x daily - 7 x weekly - 2 sets - 10 reps  Supine Butterfly Groin Stretch - 2 x daily - 7 x weekly - 3 sets - 30sec hold  Seated Hip Abduction with Resistance - 2 x daily - 7 x weekly - 2 sets -  10 reps - 1sec hold             Patient will benefit from skilled therapeutic intervention in order to improve the following deficits and impairments:  Abnormal gait, Hypomobility, Difficulty walking, Decreased strength, Pain, Decreased range of motion  Visit Diagnosis: Pain in left hip  Difficulty in walking, not elsewhere classified  Muscle weakness (generalized)  Stiffness of left hip, not elsewhere classified     Problem List Patient Active Problem List   Diagnosis Date Noted   Unstable angina  ([place]) 01/19/2017   CAD (coronary artery disease) 01/19/2017   Valentina Gu, PT, DPT #[id number] Eilleen Kempf 06/16/2021, 9:19 PM  [facility] [address]. [place], [place], [address] Phone: [phone]   Fax:  [phone]  Name: Kristen Lloyd MRN: [id number] Date of Birth: 1941-02-15

## 2021-06-16 ENCOUNTER — Encounter: Payer: Self-pay | Admitting: Physical Therapy

## 2021-06-16 NOTE — Addendum Note (Signed)
Addended by: Eilleen Kempf on: 06/16/2021 09:21 PM   Modules accepted: Orders

## 2021-06-17 ENCOUNTER — Other Ambulatory Visit: Payer: Self-pay

## 2021-06-17 ENCOUNTER — Ambulatory Visit: Payer: Medicare Other | Admitting: Physical Therapy

## 2021-06-17 DIAGNOSIS — M6281 Muscle weakness (generalized): Secondary | ICD-10-CM

## 2021-06-17 DIAGNOSIS — M25552 Pain in left hip: Secondary | ICD-10-CM | POA: Diagnosis not present

## 2021-06-17 DIAGNOSIS — M25652 Stiffness of left hip, not elsewhere classified: Secondary | ICD-10-CM

## 2021-06-17 DIAGNOSIS — R262 Difficulty in walking, not elsewhere classified: Secondary | ICD-10-CM

## 2021-06-17 NOTE — Therapy (Signed)
[facility] [address] [place], [place], [address] Phone: [phone]   Fax:  [phone]  Physical Therapy Treatment  Patient Details  Name: Kristen Lloyd MRN: [id number] Date of Birth: 29-Dec-1940 Referring Provider (PT): Allene Dillon, FNP   Encounter Date: 06/17/2021   PT End of Session - 06/18/21 1645     Visit Number 2    Number of Visits 17    Date for PT Re-Evaluation 08/11/21    Authorization - Visit Number 2    Authorization - Number of Visits 10    Progress Note Due on Visit 10    PT Start Time 6962    PT Stop Time 1640    PT Time Calculation (min) 52 min    Activity Tolerance Patient tolerated treatment well;Patient limited by pain    Behavior During Therapy [facility] for tasks assessed/performed             Past Medical History:  Diagnosis Date   Acid reflux    Coronary artery disease    Diverticulitis    Stroke [facility])     Past Surgical History:  Procedure Laterality Date   ABDOMINAL HYSTERECTOMY     BREAST BIOPSY Left 1990's   lt bx x 2-neg   CARDIAC CATHETERIZATION N/A 01/19/2017   Procedure: Left Heart Cath and Coronary Angiography;  Surgeon: Teodoro Spray, MD;  Location: [place] [facility];  Service: Cardiovascular;  Laterality: N/A;   CARDIAC CATHETERIZATION N/A 01/19/2017   Procedure: Coronary Stent Intervention;  Surgeon: Isaias Cowman, MD;  Location: [place] [facility];  Service: Cardiovascular;  Laterality: N/A;   CARDIAC SURGERY     Ileostomy and reversal N/A 2021   TONSILLECTOMY      There were no vitals filed for this visit.   Subjective Assessment - 06/17/21 1549     Subjective Patient reports compliance with HEP. She reports feeling "sluggish" at arrival to PT. Patient reports compliance with initial home exercises. Pt reports 1-3/10 pain at arrival to PT. She voices no new complaints at arrival to PT.    Pertinent History Patient is an 80 year old female with primary  complaint of L hip pain. Patient has referring diagnosis of L hip OA. Patient reports about 2 years of hip pain. She reports she was doing senior fitness program at [facility] at the time her symptoms began. She states that the staff noticed a change in her gait pattern and she felt minmal pain. This has since worsened. Patient states she was very sick last year and didn't do as much exercise at the time. Patient had ileostomy and reversal in 2021 with significant GI symptoms - significant recovery time required and patient had notably decreased physical activity. Patient did have steroid injection 05/19/2021 that did provide moderate relief. She feels that later in the day she is getting pain into thoracolumbar region from the effect her condition has on her walking. She states that during the day it "isn't that bad." Patient reports no night pain. Pain is worse in AM. Hx of TIA in 2015. No numbness or tingling. Patient stated goals: to avoid surgery and to walk better.    Limitations Standing;Walking;House hold activities    How long can you stand comfortably? 5-10 minutes    How long can you walk comfortably? 30 minutes    Diagnostic tests L hip radiographs; evidence of OA/degenerative change    Patient Stated Goals To avoid surgery, able to walk better  Currently in Pain? Yes    Pain Score 3     Pain Location Hip    Pain Orientation Left    Multiple Pain Sites Yes    Pain Score 3    Pain Location Back    Pain Orientation Lower             NuStep x 5 minutes, L1; seat 6, arms 7 (unbilled time)   Manual Therapy - for joint and soft tissue mobility, symptom modulation, hip ROM L hip PROM within pt tolerance L hip long-axis distraction in supine STM/IASTM proximal quadriceps/hip flexors in supine with roll under patient's knees STM and IASTM with [employer] along bilat erector spinae (emphasis on L side) in sitting   Therapeutic exercise - for improved strength as needed for  ability to perform transferring, weightbearing activity, obstacle negotiation; for soft tissue mobility and ROM  Lower trunk rotations; x10 alternating SLR; 2x10 Glute bridge; 1x10 Swiss ball 3-way rollout in sitting, Green physioball; x 5 ea dir, 3 sec hold   Neuromuscular Re-education - for movement control and trunk stabilization Pelvic tilt; 2x10, hooklying, anterior/posterior [heavy verbal and tactile cueing, demonstration]     ASSESSMENT Patient arrives with excellent motivation to participate in therapy. Patient tolerates passive hip motion well during manual therapy today with moderate loss of ER and IR noted. She has good response to long-axis distraction technique and has minimal sensitivity along anterior and lateral hip complex. Patient responds well to manual intervention along lumbar paraspinals with minimal low back pain reported at end of session. Pt does need further work on strengthening, pain reduction, and progressive weightbearing activity to improve gait and functional mobility as needed for best return to PLOF.     PT Short Term Goals - 06/16/21 1813       PT SHORT TERM GOAL #1   Title Pt will be independent and 100% compliant with established HEP and activity modification as needed to augment PT intervention and improve pertinent strength and mobility deficits as needed for best return to prior level of function.    Baseline HEP provided at IE 06/15/21    Time 3    Period Weeks    Status New    Target Date 07/06/21      PT SHORT TERM GOAL #2   Title Patient will perform independent sit to stand without UE support and no increase in pain indicative of improved functional LE strength and ability to perform transferring necessary for accessing home and community environment    Baseline Significant difficulty with sit to stand with heavy UE support required    Time 4    Period Weeks    Status New    Target Date 07/13/21               PT Long Term Goals -  06/16/21 1824       PT LONG TERM GOAL #1   Title Patient will demonstrate improved function as evidenced by a score of 59 on FOTO measure for full participation in activities at home and in the community.    Baseline 06/15/21: 50    Time 8    Period Weeks    Status New    Target Date 08/10/21      PT LONG TERM GOAL #2   Title Patient will perform consecutive hurdle step over 3 6-inch hurdles with good heel to toe progression, no LOB, no buckling off affected LE, and no increase in pain indicative of improved ability  to weight shift to affected LE and clear lower limb over obstacles as needed for functional mobility    Baseline Antalgic gait pattern wth decreased LLE weight shift    Time 8    Period Weeks    Status New    Target Date 08/10/21      PT LONG TERM GOAL #3   Title Patient will improve MMT to 4+/5 for all tested LE musculature indicative of improved strength as needed for improved performance of transferring and as needed to produce power to prevent LOB during episode of large postural perturbation    Baseline MMT 3+ to 4/5 for hip musculature (flexion, ER, IR, ABD), 4/5 for hamstrings and ankle dorsiflexors    Time 8    Period Weeks    Status New    Target Date 08/10/21      PT LONG TERM GOAL #4   Title Patient will ambulate x 300 feet in clinic with no AD with symmetrical weight shift and normal heel to toe progression without LOB and no reproduction of pain indicative of improved community-level gait    Baseline Gait with antalgic pattern and decreased stance time LLE    Time 8    Period Weeks    Status New    Target Date 08/10/21                   Plan - 06/18/21 1656     Clinical Impression Statement Patient arrives with excellent motivation to participate in therapy. Patient tolerates passive hip motion well during manual therapy today with moderate loss of ER and IR noted. She has good response to long-axis distraction technique and has minimal  sensitivity along anterior and lateral hip complex. Patient responds well to manual intervention along lumbar paraspinals with minimal low back pain reported at end of session. Pt does need further work on strengthening, pain reduction, and progressive weightbearing activity to improve gait and functional mobility as needed for best return to PLOF.    Personal Factors and Comorbidities Age;Comorbidity 3+;Time since onset of injury/illness/exacerbation    Comorbidities CAD, acid reflux, s/p TIA    Examination-Activity Limitations Bed Mobility;Stairs;Stand;Dressing;Bathing;Bend;Locomotion Level;Squat    Examination-Participation Restrictions Community Activity;Cleaning;Laundry    Stability/Clinical Decision Making Evolving/Moderate complexity    Rehab Potential Good    PT Frequency 2x / week    PT Duration 8 weeks    PT Treatment/Interventions ADLs/Self Care Home Management    PT Next Visit Plan Manual therapy with distraction techniques and STM for symptom modulation, hip ROM,    PT Home Exercise Plan Access Code [phone]. Supine Lower Trunk Rotation - 2 x daily - 7 x weekly - 2 sets - 10 reps  Supine Butterfly Groin Stretch - 2 x daily - 7 x weekly - 3 sets - 30sec hold  Seated Hip Abduction with Resistance - 2 x daily - 7 x weekly - 2 sets - 10 reps - 1sec hold             Patient will benefit from skilled therapeutic intervention in order to improve the following deficits and impairments:  Abnormal gait, Hypomobility, Difficulty walking, Decreased strength, Pain, Decreased range of motion  Visit Diagnosis: Pain in left hip  Difficulty in walking, not elsewhere classified  Muscle weakness (generalized)  Stiffness of left hip, not elsewhere classified     Problem List Patient Active Problem List   Diagnosis Date Noted   Unstable angina ([place]) 01/19/2017   CAD (coronary artery disease)  01/19/2017   Valentina Gu, PT, DPT #[id number] Eilleen Kempf 06/18/2021, 4:56 PM  [facility] [address] [place], [place], [address] Phone: [phone]   Fax:  [phone]  Name: ROSI SECRIST MRN: [id number] Date of Birth: 27-Dec-1941

## 2021-06-18 ENCOUNTER — Encounter: Payer: Self-pay | Admitting: Physical Therapy

## 2021-06-24 ENCOUNTER — Ambulatory Visit: Payer: Medicare Other | Admitting: Physical Therapy

## 2021-06-24 ENCOUNTER — Other Ambulatory Visit: Payer: Self-pay

## 2021-06-24 DIAGNOSIS — M25652 Stiffness of left hip, not elsewhere classified: Secondary | ICD-10-CM

## 2021-06-24 DIAGNOSIS — R262 Difficulty in walking, not elsewhere classified: Secondary | ICD-10-CM

## 2021-06-24 DIAGNOSIS — M25552 Pain in left hip: Secondary | ICD-10-CM

## 2021-06-24 DIAGNOSIS — M6281 Muscle weakness (generalized): Secondary | ICD-10-CM

## 2021-06-24 NOTE — Therapy (Signed)
[facility] [address] [place], [place], [address] Phone: [phone]   Fax:  [phone]  Physical Therapy Treatment  Patient Details  Name: Kristen Lloyd MRN: [id number] Date of Birth: 1941/06/06 Referring Provider (PT): Allene Dillon, FNP   Encounter Date: 06/24/2021   PT End of Session - 06/24/21 1616     Visit Number 3    Number of Visits 17    Date for PT Re-Evaluation 08/11/21    Authorization - Visit Number 3    Authorization - Number of Visits 10    Progress Note Due on Visit 10    PT Start Time 5638    PT Stop Time 1625    PT Time Calculation (min) 50 min    Activity Tolerance Patient tolerated treatment well;Patient limited by pain    Behavior During Therapy [facility] for tasks assessed/performed             Past Medical History:  Diagnosis Date   Acid reflux    Coronary artery disease    Diverticulitis    Stroke [facility])     Past Surgical History:  Procedure Laterality Date   ABDOMINAL HYSTERECTOMY     BREAST BIOPSY Left 1990's   lt bx x 2-neg   CARDIAC CATHETERIZATION N/A 01/19/2017   Procedure: Left Heart Cath and Coronary Angiography;  Surgeon: Teodoro Spray, MD;  Location: [place] [facility];  Service: Cardiovascular;  Laterality: N/A;   CARDIAC CATHETERIZATION N/A 01/19/2017   Procedure: Coronary Stent Intervention;  Surgeon: Isaias Cowman, MD;  Location: [place] [facility];  Service: Cardiovascular;  Laterality: N/A;   CARDIAC SURGERY     Ileostomy and reversal N/A 2021   TONSILLECTOMY      There were no vitals filed for this visit.   Subjective Assessment - 06/24/21 1540     Subjective Patient reports tolerating her last session well, but she had exacerbation of pain yesterday with vacuuming. Patient reports compliance with HEP, but she is unsure about technique with SLR, pelvic tilt, bridges. She reports more pain along L posterolateral hip region and into L lower lumbar region.  6-7/10 pain at arrival to PT.    Pertinent History Patient is an 80 year old female with primary complaint of L hip pain. Patient has referring diagnosis of L hip OA. Patient reports about 2 years of hip pain. She reports she was doing senior fitness program at [facility] at the time her symptoms began. She states that the staff noticed a change in her gait pattern and she felt minmal pain. This has since worsened. Patient states she was very sick last year and didn't do as much exercise at the time. Patient had ileostomy and reversal in 2021 with significant GI symptoms - significant recovery time required and patient had notably decreased physical activity. Patient did have steroid injection 05/19/2021 that did provide moderate relief. She feels that later in the day she is getting pain into thoracolumbar region from the effect her condition has on her walking. She states that during the day it "isn't that bad." Patient reports no night pain. Pain is worse in AM. Hx of TIA in 2015. No numbness or tingling. Patient stated goals: to avoid surgery and to walk better.    Limitations Standing;Walking;House hold activities    How long can you stand comfortably? 5-10 minutes    How long can you walk comfortably? 30 minutes    Diagnostic tests L hip radiographs; evidence of OA/degenerative  change    Patient Stated Goals To avoid surgery, able to walk better    Currently in Pain? Yes    Pain Score 7     Pain Location Hip    Pain Orientation Left;Posterior;Lateral              NuStep x 5 minutes, L1; seat 6, arms 7 (unbilled time)     Manual Therapy - for joint and soft tissue mobility, symptom modulation, hip ROM L hip PROM within pt tolerance L hip long-axis distraction in supine STM and IASTM with Hypervolt along bilat erector spinae (emphasis on L side) and L gluteal mm in R sidelying     Therapeutic exercise - for improved strength as needed for ability to perform transferring,  weightbearing activity, obstacle negotiation; for soft tissue mobility and ROM   Lower trunk rotations; x10 alternating Open book; x10 ea side SLR; 2x10 Glute bridge; 2x10 Swiss ball 3-way rollout in sitting, Green physioball; x 5 ea dir, 3 sec hold  Gait trial with SPC with verbal cueing for correct sequencing of cane and placement of cane; x 1 min  *next visit* Sit to stand, raised table     Neuromuscular Re-education - for movement control and trunk stabilization, targeting cueing for gluteal activation Pelvic tilt; 2x10, hooklying, anterior/posterior [minimal verbal and tactile cueing]    *next visit*  Clamshell 3-way Hip, standing with bilat UE support;      Multimodal cueing including tactile and verbal cueing as well as exercise demonstration utilized throughout session to optimize technique. Home exercise program discussed and reviewed throughout session.     ASSESSMENT Patient arrives with excellent motivation to participate in therapy. She has a mixed response to long-axis distraction today with remaining groin pain during technique. She has moderate sensitivity along gluteal musculature and L>R lower lumbar erector spinae with good response to IASTM today. Patient is able to demonstrate proper technique with thoracolumbar mobility drills and supine OKC strengthening drills with heavy verbal cueing and demonstration. Patient has notable low back pain with rolling on table and ongoing decreased since time on LLE during L stance phase. Pt does need further work on strengthening, pain reduction, and progressive weightbearing activity to improve gait and functional mobility as needed for best return to PLOF.      PT Short Term Goals - 06/16/21 1813       PT SHORT TERM GOAL #1   Title Pt will be independent and 100% compliant with established HEP and activity modification as needed to augment PT intervention and improve pertinent strength and mobility deficits as needed for  best return to prior level of function.    Baseline HEP provided at IE 06/15/21    Time 3    Period Weeks    Status New    Target Date 07/06/21      PT SHORT TERM GOAL #2   Title Patient will perform independent sit to stand without UE support and no increase in pain indicative of improved functional LE strength and ability to perform transferring necessary for accessing home and community environment    Baseline Significant difficulty with sit to stand with heavy UE support required    Time 4    Period Weeks    Status New    Target Date 07/13/21               PT Long Term Goals - 06/16/21 1824       PT LONG TERM GOAL #1  Title Patient will demonstrate improved function as evidenced by a score of 59 on FOTO measure for full participation in activities at home and in the community.    Baseline 06/15/21: 50    Time 8    Period Weeks    Status New    Target Date 08/10/21      PT LONG TERM GOAL #2   Title Patient will perform consecutive hurdle step over 3 6-inch hurdles with good heel to toe progression, no LOB, no buckling off affected LE, and no increase in pain indicative of improved ability to weight shift to affected LE and clear lower limb over obstacles as needed for functional mobility    Baseline Antalgic gait pattern wth decreased LLE weight shift    Time 8    Period Weeks    Status New    Target Date 08/10/21      PT LONG TERM GOAL #3   Title Patient will improve MMT to 4+/5 for all tested LE musculature indicative of improved strength as needed for improved performance of transferring and as needed to produce power to prevent LOB during episode of large postural perturbation    Baseline MMT 3+ to 4/5 for hip musculature (flexion, ER, IR, ABD), 4/5 for hamstrings and ankle dorsiflexors    Time 8    Period Weeks    Status New    Target Date 08/10/21      PT LONG TERM GOAL #4   Title Patient will ambulate x 300 feet in clinic with no AD with symmetrical weight  shift and normal heel to toe progression without LOB and no reproduction of pain indicative of improved community-level gait    Baseline Gait with antalgic pattern and decreased stance time LLE    Time 8    Period Weeks    Status New    Target Date 08/10/21                   Plan - 06/25/21 0912     Clinical Impression Statement Patient arrives with excellent motivation to participate in therapy. She has a mixed response to long-axis distraction today with remaining groin pain during technique. She has moderate sensitivity along gluteal musculature and L>R lower lumbar erector spinae with good response to IASTM today. Patient is able to demonstrate proper technique with thoracolumbar mobility drills and supine OKC strengthening drills with heavy verbal cueing and demonstration. Patient has notable low back pain with rolling on table and ongoing decreased since time on LLE during L stance phase. Pt does need further work on strengthening, pain reduction, and progressive weightbearing activity to improve gait and functional mobility as needed for best return to PLOF.    Personal Factors and Comorbidities Age;Comorbidity 3+;Time since onset of injury/illness/exacerbation    Comorbidities CAD, acid reflux, s/p TIA    Examination-Activity Limitations Bed Mobility;Stairs;Stand;Dressing;Bathing;Bend;Locomotion Level;Squat    Examination-Participation Restrictions Community Activity;Cleaning;Laundry    Stability/Clinical Decision Making Evolving/Moderate complexity    Rehab Potential Good    PT Frequency 2x / week    PT Duration 8 weeks    PT Treatment/Interventions ADLs/Self Care Home Management    PT Next Visit Plan Manual therapy with distraction techniques and STM for symptom modulation, hip ROM,    PT Home Exercise Plan Access Code [id number]. Supine Lower Trunk Rotation - 2 x daily - 7 x weekly - 2 sets - 10 reps  Supine Butterfly Groin Stretch - 2 x daily - 7 x weekly - 3  sets - 30sec  hold  Seated Hip Abduction with Resistance - 2 x daily - 7 x weekly - 2 sets - 10 reps - 1sec hold             Patient will benefit from skilled therapeutic intervention in order to improve the following deficits and impairments:  Abnormal gait, Hypomobility, Difficulty walking, Decreased strength, Pain, Decreased range of motion  Visit Diagnosis: Pain in left hip  Difficulty in walking, not elsewhere classified  Muscle weakness (generalized)  Stiffness of left hip, not elsewhere classified     Problem List Patient Active Problem List   Diagnosis Date Noted   Unstable angina ([place]) 01/19/2017   CAD (coronary artery disease) 01/19/2017   Valentina Gu, PT, DPT #[id number] Eilleen Kempf 06/25/2021, 9:14 AM  [facility] [address]. [place], [place], [address] Phone: [phone]   Fax:  [phone]  Name: DONICA DEROUIN MRN: [id number] Date of Birth: [birth year]

## 2021-06-25 ENCOUNTER — Encounter: Payer: Self-pay | Admitting: Physical Therapy

## 2021-06-25 ENCOUNTER — Ambulatory Visit: Payer: Medicare Other | Admitting: Physical Therapy

## 2021-06-25 DIAGNOSIS — R262 Difficulty in walking, not elsewhere classified: Secondary | ICD-10-CM

## 2021-06-25 DIAGNOSIS — M25652 Stiffness of left hip, not elsewhere classified: Secondary | ICD-10-CM

## 2021-06-25 DIAGNOSIS — M6281 Muscle weakness (generalized): Secondary | ICD-10-CM

## 2021-06-25 DIAGNOSIS — M25552 Pain in left hip: Secondary | ICD-10-CM | POA: Diagnosis not present

## 2021-06-25 NOTE — Therapy (Addendum)
[place] [facility] [address] [place], [place], [address] Phone: [phone]   Fax:  [phone]  Physical Therapy Treatment  Patient Details  Name: Kristen Lloyd MRN: [id number] Date of Birth: July 10, 1941 Referring Provider (PT): Allene Dillon, FNP   Encounter Date: 06/25/2021   PT End of Session - 06/25/21 1635     Visit Number 4    Number of Visits 17    Date for PT Re-Evaluation 08/11/21    Authorization - Visit Number 4    Authorization - Number of Visits 10    Progress Note Due on Visit 10    PT Start Time 1600    PT Stop Time 8242    PT Time Calculation (min) 45 min    Activity Tolerance Patient tolerated treatment well;Patient limited by pain    Behavior During Therapy [facility] for tasks assessed/performed             Past Medical History:  Diagnosis Date   Acid reflux    Coronary artery disease    Diverticulitis    Stroke [facility])     Past Surgical History:  Procedure Laterality Date   ABDOMINAL HYSTERECTOMY     BREAST BIOPSY Left 1990's   lt bx x 2-neg   CARDIAC CATHETERIZATION N/A 01/19/2017   Procedure: Left Heart Cath and Coronary Angiography;  Surgeon: Teodoro Spray, MD;  Location: [place] [facility];  Service: Cardiovascular;  Laterality: N/A;   CARDIAC CATHETERIZATION N/A 01/19/2017   Procedure: Coronary Stent Intervention;  Surgeon: Isaias Cowman, MD;  Location: [place] [facility];  Service: Cardiovascular;  Laterality: N/A;   CARDIAC SURGERY     Ileostomy and reversal N/A 2021   TONSILLECTOMY      There were no vitals filed for this visit.   Subjective Assessment - 06/25/21 1607     Subjective Patient reports feeling "not too bad at arrival." She reports significant AM stiffness. Patient reports 5-6/10 pain this AM that improved after moving around. She reports ongoing difficulty with initiating gait following period of sitting.    Pertinent History Patient is an 80 year old female with  primary complaint of L hip pain. Patient has referring diagnosis of L hip OA. Patient reports about 2 years of hip pain. She reports she was doing senior fitness program at [facility] at the time her symptoms began. She states that the staff noticed a change in her gait pattern and she felt minmal pain. This has since worsened. Patient states she was very sick last year and didn't do as much exercise at the time. Patient had ileostomy and reversal in 2021 with significant GI symptoms - significant recovery time required and patient had notably decreased physical activity. Patient did have steroid injection 05/19/2021 that did provide moderate relief. She feels that later in the day she is getting pain into thoracolumbar region from the effect her condition has on her walking. She states that during the day it "isn't that bad." Patient reports no night pain. Pain is worse in AM. Hx of TIA in 2015. No numbness or tingling. Patient stated goals: to avoid surgery and to walk better.    Limitations Standing;Walking;House hold activities    How long can you stand comfortably? 5-10 minutes    How long can you walk comfortably? 30 minutes    Diagnostic tests L hip radiographs; evidence of OA/degenerative change    Patient Stated Goals To avoid surgery, able to walk better    Currently  in Pain? Yes    Pain Score 6     Pain Location Hip    Pain Orientation Left             Treatment Performed  NuStep x 5 minutes, L1; seat 6, arms 7 (unbilled time)     Manual Therapy - for joint and soft tissue mobility, symptom modulation, hip ROM L hip PROM within pt tolerance L hip long-axis distraction in supine L hip lateral distraction with Mulligan belt, intermittent L hip MWM (c belt distraction) with hip flexion L hip posterior mobilization (axial load through femur) with conjunct passive ER/IR within pt tolerance  [*not today*] STM and IASTM with Hypervolt along bilat erector spinae (emphasis on L  side) and L gluteal mm in R sidelying     Therapeutic exercise - for improved strength as needed for ability to perform transferring, weightbearing activity, obstacle negotiation; for soft tissue mobility and ROM   Butterfly stretch; 3x30sec, hooklying Lower trunk rotations; x10 alternating SLR; 2x10 Glute bridge; 2x10 Sit to stand, raised table; 2x10  *not today* Open book; x10 ea side Swiss ball 3-way rollout in sitting, Green physioball; x 5 ea dir, 3 sec hold     Neuromuscular Re-education - for movement control and trunk stabilization, targeting cueing for gluteal activation Clamshell; 2x10, R sidelying [verbal cueing, tactile cueing, and demo for technique and to prevent trunk rotation compensation]  *next visit* 3-way Hip, standing with bilat UE support;    *not today* Pelvic tilt; 2x10, hooklying, anterior/posterior [minimal verbal and tactile cueing]   Multimodal cueing including tactile and verbal cueing as well as exercise demonstration utilized throughout session to optimize technique. Home exercise program discussed and reviewed throughout session.       ASSESSMENT Patient arrives with excellent motivation to participate in therapy. Patient has improved tolerance of bed/mat mobility. She has mixed response with Mulligan belt techniques with poor tolerance of direct pressure on adductor muscle group in spite of cushion/blanket roll utilized. Patient tolerates modest progression of hip isotonics well this afternoon. She has ongoing antalgic gait pattern with decreased stance time on LLE. Pt does need further work on strengthening, pain reduction, and progressive weightbearing activity to improve gait and functional mobility as needed for best return to PLOF.     PT Short Term Goals - 06/16/21 1813       PT SHORT TERM GOAL #1   Title Pt will be independent and 100% compliant with established HEP and activity modification as needed to augment PT intervention and improve  pertinent strength and mobility deficits as needed for best return to prior level of function.    Baseline HEP provided at IE 06/15/21    Time 3    Period Weeks    Status New    Target Date 07/06/21      PT SHORT TERM GOAL #2   Title Patient will perform independent sit to stand without UE support and no increase in pain indicative of improved functional LE strength and ability to perform transferring necessary for accessing home and community environment    Baseline Significant difficulty with sit to stand with heavy UE support required    Time 4    Period Weeks    Status New    Target Date 07/13/21               PT Long Term Goals - 06/16/21 1824       PT LONG TERM GOAL #1   Title Patient will  demonstrate improved function as evidenced by a score of 59 on FOTO measure for full participation in activities at home and in the community.    Baseline 06/15/21: 50    Time 8    Period Weeks    Status New    Target Date 08/10/21      PT LONG TERM GOAL #2   Title Patient will perform consecutive hurdle step over 3 6-inch hurdles with good heel to toe progression, no LOB, no buckling off affected LE, and no increase in pain indicative of improved ability to weight shift to affected LE and clear lower limb over obstacles as needed for functional mobility    Baseline Antalgic gait pattern wth decreased LLE weight shift    Time 8    Period Weeks    Status New    Target Date 08/10/21      PT LONG TERM GOAL #3   Title Patient will improve MMT to 4+/5 for all tested LE musculature indicative of improved strength as needed for improved performance of transferring and as needed to produce power to prevent LOB during episode of large postural perturbation    Baseline MMT 3+ to 4/5 for hip musculature (flexion, ER, IR, ABD), 4/5 for hamstrings and ankle dorsiflexors    Time 8    Period Weeks    Status New    Target Date 08/10/21      PT LONG TERM GOAL #4   Title Patient will ambulate x  300 feet in clinic with no AD with symmetrical weight shift and normal heel to toe progression without LOB and no reproduction of pain indicative of improved community-level gait    Baseline Gait with antalgic pattern and decreased stance time LLE    Time 8    Period Weeks    Status New    Target Date 08/10/21                   Plan - 06/26/21 0735     Clinical Impression Statement Patient arrives with excellent motivation to participate in therapy. Patient has improved tolerance of bed/mat mobility. She has mixed response with Mulligan belt techniques with poor tolerance of direct pressure on adductor muscle group in spite of cushion/blanket roll utilized. Patient tolerates modest progression of hip isotonics well this afternoon. She has ongoing antalgic gait pattern with decreased stance time on LLE. Pt does need further work on strengthening, pain reduction, and progressive weightbearing activity to improve gait and functional mobility as needed for best return to PLOF.    Personal Factors and Comorbidities Age;Comorbidity 3+;Time since onset of injury/illness/exacerbation    Comorbidities CAD, acid reflux, s/p TIA    Examination-Activity Limitations Bed Mobility;Stairs;Stand;Dressing;Bathing;Bend;Locomotion Level;Squat    Examination-Participation Restrictions Community Activity;Cleaning;Laundry    Stability/Clinical Decision Making Evolving/Moderate complexity    Rehab Potential Good    PT Frequency 2x / week    PT Duration 8 weeks    PT Treatment/Interventions ADLs/Self Care Home Management    PT Next Visit Plan Manual therapy with distraction techniques and STM for symptom modulation, hip ROM,    PT Home Exercise Plan Access Code [phone]. Supine Lower Trunk Rotation - 2 x daily - 7 x weekly - 2 sets - 10 reps  Supine Butterfly Groin Stretch - 2 x daily - 7 x weekly - 3 sets - 30sec hold  Seated Hip Abduction with Resistance - 2 x daily - 7 x weekly - 2 sets - 10 reps - 1sec  hold             Patient will benefit from skilled therapeutic intervention in order to improve the following deficits and impairments:  Abnormal gait, Hypomobility, Difficulty walking, Decreased strength, Pain, Decreased range of motion  Visit Diagnosis: Pain in left hip  Difficulty in walking, not elsewhere classified  Muscle weakness (generalized)  Stiffness of left hip, not elsewhere classified     Problem List Patient Active Problem List   Diagnosis Date Noted   Unstable angina ([place]) 01/19/2017   CAD (coronary artery disease) 01/19/2017    Valentina Gu, PT, DPT #[id number] Kristen Lloyd 06/26/2021, 7:36 AM  [facility] [address] [place], [place], [address] Phone: [phone]   Fax:  [phone]  Name: Kristen Lloyd MRN: [id number] Date of Birth: 1941-08-29

## 2021-06-26 ENCOUNTER — Encounter: Payer: Self-pay | Admitting: Physical Therapy

## 2021-07-01 ENCOUNTER — Other Ambulatory Visit: Payer: Self-pay

## 2021-07-01 ENCOUNTER — Ambulatory Visit: Payer: Medicare Other | Attending: Family Medicine | Admitting: Physical Therapy

## 2021-07-01 DIAGNOSIS — M6281 Muscle weakness (generalized): Secondary | ICD-10-CM | POA: Diagnosis present

## 2021-07-01 DIAGNOSIS — M25652 Stiffness of left hip, not elsewhere classified: Secondary | ICD-10-CM | POA: Diagnosis present

## 2021-07-01 DIAGNOSIS — R262 Difficulty in walking, not elsewhere classified: Secondary | ICD-10-CM | POA: Insufficient documentation

## 2021-07-01 DIAGNOSIS — M25552 Pain in left hip: Secondary | ICD-10-CM | POA: Diagnosis present

## 2021-07-01 NOTE — Therapy (Signed)
[facility] [address] [place], [place], [address] Phone: [phone]   Fax:  [phone]  Physical Therapy Treatment  Patient Details  Name: Kristen Lloyd MRN: [id number] Date of Birth: 07/28/1941 Referring Provider (PT): Allene Dillon, FNP   Encounter Date: 07/01/2021   PT End of Session - 07/02/21 1903     Visit Number 5    Number of Visits 17    Date for PT Re-Evaluation 08/11/21    Authorization - Visit Number 5    Authorization - Number of Visits 10    Progress Note Due on Visit 10    PT Start Time 9675    PT Stop Time 1630    PT Time Calculation (min) 47 min    Activity Tolerance Patient tolerated treatment well;Patient limited by pain    Behavior During Therapy [facility] for tasks assessed/performed             Past Medical History:  Diagnosis Date   Acid reflux    Coronary artery disease    Diverticulitis    Stroke [facility])     Past Surgical History:  Procedure Laterality Date   ABDOMINAL HYSTERECTOMY     BREAST BIOPSY Left 1990's   lt bx x 2-neg   CARDIAC CATHETERIZATION N/A 01/19/2017   Procedure: Left Heart Cath and Coronary Angiography;  Surgeon: Teodoro Spray, MD;  Location: [place] [facility];  Service: Cardiovascular;  Laterality: N/A;   CARDIAC CATHETERIZATION N/A 01/19/2017   Procedure: Coronary Stent Intervention;  Surgeon: Isaias Cowman, MD;  Location: [place] [facility];  Service: Cardiovascular;  Laterality: N/A;   CARDIAC SURGERY     Ileostomy and reversal N/A 2021   TONSILLECTOMY      There were no vitals filed for this visit.   Subjective Assessment - 07/01/21 1543     Subjective Patient reports feeling worse this weekend; she attributes this to cleaning her house Friday afternoon and Saturday. 5-6/10 pain at arrival to PT. Patient reports lateral hip pain at arrival to PT. She reports some burning along lateral hip following her last session. She states she has to complete some  stretching to get "limbered up" in the AM. Patient reports compliance with her HEP.    Pertinent History Patient is an 80 year old female with primary complaint of L hip pain. Patient has referring diagnosis of L hip OA. Patient reports about 2 years of hip pain. She reports she was doing senior fitness program at [facility] at the time her symptoms began. She states that the staff noticed a change in her gait pattern and she felt minmal pain. This has since worsened. Patient states she was very sick last year and didn't do as much exercise at the time. Patient had ileostomy and reversal in 2021 with significant GI symptoms - significant recovery time required and patient had notably decreased physical activity. Patient did have steroid injection 05/19/2021 that did provide moderate relief. She feels that later in the day she is getting pain into thoracolumbar region from the effect her condition has on her walking. She states that during the day it "isn't that bad." Patient reports no night pain. Pain is worse in AM. Hx of TIA in 2015. No numbness or tingling. Patient stated goals: to avoid surgery and to walk better.    Limitations Standing;Walking;House hold activities    How long can you stand comfortably? 5-10 minutes    How long can you walk comfortably? 30 minutes  Diagnostic tests L hip radiographs; evidence of OA/degenerative change    Patient Stated Goals To avoid surgery, able to walk better    Currently in Pain? Yes    Pain Score 6     Pain Location Hip   L groin and lateral hip   Pain Orientation Left;Anterior;Lateral    Pain Type Chronic pain              Treatment Performed   NuStep x 5 minutes, L1; seat 6, arms 7 (unbilled time)     Manual Therapy - for joint and soft tissue mobility, symptom modulation, hip ROM  L hip PROM within pt tolerance L hip posterior mobilization (axial load through femur) with conjunct passive ER/IR within pt tolerance STM and IASTM with  Hypervolt along bilat erector spinae (emphasis on L side) and L gluteal mm in R sidelying   [*not today*] L hip long-axis distraction in supine L hip lateral distraction with Mulligan belt, intermittent L hip MWM (c belt distraction) with hip flexion      Therapeutic exercise - for improved strength as needed for ability to perform transferring, weightbearing activity, obstacle negotiation; for soft tissue mobility and ROM   Butterfly stretch; 3x30sec, hooklying Lower trunk rotations; x10 alternating Open book; x10 ea side Sit to stand, raised table; 2x10   *not today* SLR; 2x10 Glute bridge; 2x10 Swiss ball 3-way rollout in sitting, Green physioball; x 5 ea dir, 3 sec hold     Neuromuscular Re-education - for movement control and trunk stabilization, targeting cueing for gluteal activation Clamshell; 2x10, R sidelying [verbal cueing, tactile cueing, and demo for technique and to prevent trunk rotation compensation 3-way Hip, standing with bilat UE support; x5 ea dir, bilat   *not today* Pelvic tilt; 2x10, hooklying, anterior/posterior [minimal verbal and tactile cueing]     Multimodal cueing including tactile and verbal cueing as well as exercise demonstration utilized throughout session to optimize technique. Home exercise program discussed and reviewed throughout session.        ASSESSMENT Patient arrives with excellent motivation to participate in therapy.  She has ongoing antalgic gait pattern with decreased stance time on LLE, though this is mild today compared to previous sessions. Patient presents with improving hip ROM, but she has ongoing significant deficit in hip ER, IR, and extension ROM. Patient is able to progress activities in clinic to include low volume of CKC isotonics with no notable increase in pain today. Pt does need further work on strengthening, pain reduction, and progressive weightbearing activity to improve gait and functional mobility as needed for best  return to PLOF.        PT Short Term Goals - 06/16/21 1813       PT SHORT TERM GOAL #1   Title Pt will be independent and 100% compliant with established HEP and activity modification as needed to augment PT intervention and improve pertinent strength and mobility deficits as needed for best return to prior level of function.    Baseline HEP provided at IE 06/15/21    Time 3    Period Weeks    Status New    Target Date 07/06/21      PT SHORT TERM GOAL #2   Title Patient will perform independent sit to stand without UE support and no increase in pain indicative of improved functional LE strength and ability to perform transferring necessary for accessing home and community environment    Baseline Significant difficulty with sit to stand with heavy  UE support required    Time 4    Period Weeks    Status New    Target Date 07/13/21               PT Long Term Goals - 06/16/21 1824       PT LONG TERM GOAL #1   Title Patient will demonstrate improved function as evidenced by a score of 59 on FOTO measure for full participation in activities at home and in the community.    Baseline 06/15/21: 50    Time 8    Period Weeks    Status New    Target Date 08/10/21      PT LONG TERM GOAL #2   Title Patient will perform consecutive hurdle step over 3 6-inch hurdles with good heel to toe progression, no LOB, no buckling off affected LE, and no increase in pain indicative of improved ability to weight shift to affected LE and clear lower limb over obstacles as needed for functional mobility    Baseline Antalgic gait pattern wth decreased LLE weight shift    Time 8    Period Weeks    Status New    Target Date 08/10/21      PT LONG TERM GOAL #3   Title Patient will improve MMT to 4+/5 for all tested LE musculature indicative of improved strength as needed for improved performance of transferring and as needed to produce power to prevent LOB during episode of large postural perturbation     Baseline MMT 3+ to 4/5 for hip musculature (flexion, ER, IR, ABD), 4/5 for hamstrings and ankle dorsiflexors    Time 8    Period Weeks    Status New    Target Date 08/10/21      PT LONG TERM GOAL #4   Title Patient will ambulate x 300 feet in clinic with no AD with symmetrical weight shift and normal heel to toe progression without LOB and no reproduction of pain indicative of improved community-level gait    Baseline Gait with antalgic pattern and decreased stance time LLE    Time 8    Period Weeks    Status New    Target Date 08/10/21                   Plan - 07/02/21 1908     Clinical Impression Statement Patient arrives with excellent motivation to participate in therapy.  She has ongoing antalgic gait pattern with decreased stance time on LLE, though this is mild today compared to previous sessions. Patient presents with improving hip ROM, but she has ongoing significant deficit in hip ER, IR, and extension ROM. Patient is able to progress activities in clinic to include low volume of CKC isotonics with no notable increase in pain today. Pt does need further work on strengthening, pain reduction, and progressive weightbearing activity to improve gait and functional mobility as needed for best return to PLOF.    Personal Factors and Comorbidities Age;Comorbidity 3+;Time since onset of injury/illness/exacerbation    Comorbidities CAD, acid reflux, s/p TIA    Examination-Activity Limitations Bed Mobility;Stairs;Stand;Dressing;Bathing;Bend;Locomotion Level;Squat    Examination-Participation Restrictions Community Activity;Cleaning;Laundry    Stability/Clinical Decision Making Evolving/Moderate complexity    Rehab Potential Good    PT Frequency 2x / week    PT Duration 8 weeks    PT Treatment/Interventions ADLs/Self Care Home Management    PT Next Visit Plan Manual therapy with distraction techniques and STM for symptom modulation, hip  ROM, unloaded isotonics with gradual  transition to progressive weightbearing and CKC strengthening    PT Home Exercise Plan Access Code [id number].             Patient will benefit from skilled therapeutic intervention in order to improve the following deficits and impairments:  Abnormal gait, Hypomobility, Difficulty walking, Decreased strength, Pain, Decreased range of motion  Visit Diagnosis: Pain in left hip  Difficulty in walking, not elsewhere classified  Muscle weakness (generalized)  Stiffness of left hip, not elsewhere classified     Problem List Patient Active Problem List   Diagnosis Date Noted   Unstable angina ([place]) 01/19/2017   CAD (coronary artery disease) 01/19/2017   Valentina Gu, PT, DPT #[id number] Eilleen Kempf 07/02/2021, 7:09 PM  [facility] [address] [place], [place], [address] Phone: [phone]   Fax:  [phone]  Name: AMIKA TASSIN MRN: [id number] Date of Birth: 27-Dec-1941

## 2021-07-02 ENCOUNTER — Encounter: Payer: Self-pay | Admitting: Physical Therapy

## 2021-07-06 ENCOUNTER — Ambulatory Visit: Payer: Medicare Other | Admitting: Physical Therapy

## 2021-07-06 ENCOUNTER — Encounter: Payer: Self-pay | Admitting: Physical Therapy

## 2021-07-06 ENCOUNTER — Other Ambulatory Visit: Payer: Self-pay

## 2021-07-06 DIAGNOSIS — R262 Difficulty in walking, not elsewhere classified: Secondary | ICD-10-CM

## 2021-07-06 DIAGNOSIS — M25652 Stiffness of left hip, not elsewhere classified: Secondary | ICD-10-CM

## 2021-07-06 DIAGNOSIS — M25552 Pain in left hip: Secondary | ICD-10-CM | POA: Diagnosis not present

## 2021-07-06 DIAGNOSIS — M6281 Muscle weakness (generalized): Secondary | ICD-10-CM

## 2021-07-06 NOTE — Therapy (Signed)
[facility] [address] [place], [place], [address] Phone: [phone]   Fax:  [phone]  Physical Therapy Treatment  Patient Details  Name: Kristen Lloyd MRN: [id number] Date of Birth: [birth year] Referring Provider (PT): Allene Dillon, FNP   Encounter Date: 07/06/2021   PT End of Session - 07/06/21 1551     Visit Number 6    Number of Visits 17    Date for PT Re-Evaluation 08/11/21    Authorization - Visit Number 6    Authorization - Number of Visits 10    Progress Note Due on Visit 10    PT Start Time 7353    PT Stop Time 1630    PT Time Calculation (min) 46 min    Activity Tolerance Patient tolerated treatment well;Patient limited by pain    Behavior During Therapy [facility] for tasks assessed/performed              Past Medical History:  Diagnosis Date   Acid reflux    Coronary artery disease    Diverticulitis    Stroke [facility])     Past Surgical History:  Procedure Laterality Date   ABDOMINAL HYSTERECTOMY     BREAST BIOPSY Left 1990's   lt bx x 2-neg   CARDIAC CATHETERIZATION N/A 01/19/2017   Procedure: Left Heart Cath and Coronary Angiography;  Surgeon: Teodoro Spray, MD;  Location: [place] [facility];  Service: Cardiovascular;  Laterality: N/A;   CARDIAC CATHETERIZATION N/A 01/19/2017   Procedure: Coronary Stent Intervention;  Surgeon: Isaias Cowman, MD;  Location: [place] [facility];  Service: Cardiovascular;  Laterality: N/A;   CARDIAC SURGERY     Ileostomy and reversal N/A 2021   TONSILLECTOMY      There were no vitals filed for this visit.   Subjective Assessment - 07/06/21 1547     Subjective Patient reports she had notable pain over the weekend without any additional activity or aggravating factors per patient's report. She reports feeling "not too bad" at arrival to therapy today. Patient reports moderate soreness after her last session. She reports compliance with her HEP usually, but  she states she "skipped them" this AM due to ample errands she had to complete today.    Pertinent History Patient is an 80 year old female with primary complaint of L hip pain. Patient has referring diagnosis of L hip OA. Patient reports about 2 years of hip pain. She reports she was doing senior fitness program at [facility] at the time her symptoms began. She states that the staff noticed a change in her gait pattern and she felt minmal pain. This has since worsened. Patient states she was very sick last year and didn't do as much exercise at the time. Patient had ileostomy and reversal in 2021 with significant GI symptoms - significant recovery time required and patient had notably decreased physical activity. Patient did have steroid injection 05/19/2021 that did provide moderate relief. She feels that later in the day she is getting pain into thoracolumbar region from the effect her condition has on her walking. She states that during the day it "isn't that bad." Patient reports no night pain. Pain is worse in AM. Hx of TIA in 2015. No numbness or tingling. Patient stated goals: to avoid surgery and to walk better.    Limitations Standing;Walking;House hold activities    How long can you stand comfortably? 5-10 minutes    How long can you walk comfortably? 30 minutes  Diagnostic tests L hip radiographs; evidence of OA/degenerative change    Patient Stated Goals To avoid surgery, able to walk better    Currently in Pain? Yes    Pain Score 6     Pain Location Hip            Hip PROM Flexion: R WNL, L 110 Abduction R 40, L 27 ER: R WNL, L 45 IR: R 30, L 22   Treatment Performed   NuStep x 5 minutes, L2; seat 6, arms 7 (unbilled time)     Manual Therapy - for joint and soft tissue mobility, symptom modulation, hip ROM  L hip PROM within pt tolerance L hip posterior mobilization (axial load through femur) with conjunct passive ER/IR within pt tolerance STM and IASTM with  Hypervolt along bilat L3-L5 erector spinae (emphasis on L side) and L gluteal mm in R sidelying   [*not today*] L hip long-axis distraction in supine L hip lateral distraction with Mulligan belt, intermittent L hip MWM (c belt distraction) with hip flexion      Therapeutic exercise - for improved strength as needed for ability to perform transferring, weightbearing activity, obstacle negotiation; for soft tissue mobility and ROM   Butterfly stretch; 3x30sec, hooklying Lower trunk rotations; x10 alternating Sit to stand, raised table; 2x10   *not today* Open book; x10 ea side SLR; 2x10 Glute bridge; 2x10 Swiss ball 3-way rollout in sitting, Green physioball; x 5 ea dir, 3 sec hold     Neuromuscular Re-education - for movement control and trunk stabilization, targeting cueing for gluteal activation Clamshell; 2x10, R sidelying [verbal cueing, tactile cueing, and demo for technique and to prevent trunk rotation compensation 3-way Hip, standing with bilat UE support; x8 ea dir, bilat   *not today* Pelvic tilt; 2x10, hooklying, anterior/posterior [minimal verbal and tactile cueing]     Multimodal cueing including tactile and verbal cueing as well as exercise demonstration utilized throughout session to optimize technique. Home exercise program discussed and reviewed throughout session.        ASSESSMENT Patient demonstrates improving gait pattern with improving weight shift to left LE. She does still have mildly decreased stance time on LLE during gait. She exhibits improved L hip external rotation ROM as needed for managing lower limb during self-care activities (accessing FABER position) e.g. dressing and bathing activities. She has ongoing hip abduction stiffness. She has consistent reports of pain at follow-up PT visits. Pt does need further work on pain control and hip mobility; additionally patient needs further work on strengthening, pain reduction, and progressive weightbearing  activity to improve gait and functional mobility as needed for best return to PLOF.      PT Short Term Goals - 06/16/21 1813       PT SHORT TERM GOAL #1   Title Pt will be independent and 100% compliant with established HEP and activity modification as needed to augment PT intervention and improve pertinent strength and mobility deficits as needed for best return to prior level of function.    Baseline HEP provided at IE 06/15/21    Time 3    Period Weeks    Status New    Target Date 07/06/21      PT SHORT TERM GOAL #2   Title Patient will perform independent sit to stand without UE support and no increase in pain indicative of improved functional LE strength and ability to perform transferring necessary for accessing home and community environment    Baseline Significant difficulty  with sit to stand with heavy UE support required    Time 4    Period Weeks    Status New    Target Date 07/13/21               PT Long Term Goals - 06/16/21 1824       PT LONG TERM GOAL #1   Title Patient will demonstrate improved function as evidenced by a score of 59 on FOTO measure for full participation in activities at home and in the community.    Baseline 06/15/21: 50    Time 8    Period Weeks    Status New    Target Date 08/10/21      PT LONG TERM GOAL #2   Title Patient will perform consecutive hurdle step over 3 6-inch hurdles with good heel to toe progression, no LOB, no buckling off affected LE, and no increase in pain indicative of improved ability to weight shift to affected LE and clear lower limb over obstacles as needed for functional mobility    Baseline Antalgic gait pattern wth decreased LLE weight shift    Time 8    Period Weeks    Status New    Target Date 08/10/21      PT LONG TERM GOAL #3   Title Patient will improve MMT to 4+/5 for all tested LE musculature indicative of improved strength as needed for improved performance of transferring and as needed to produce  power to prevent LOB during episode of large postural perturbation    Baseline MMT 3+ to 4/5 for hip musculature (flexion, ER, IR, ABD), 4/5 for hamstrings and ankle dorsiflexors    Time 8    Period Weeks    Status New    Target Date 08/10/21      PT LONG TERM GOAL #4   Title Patient will ambulate x 300 feet in clinic with no AD with symmetrical weight shift and normal heel to toe progression without LOB and no reproduction of pain indicative of improved community-level gait    Baseline Gait with antalgic pattern and decreased stance time LLE    Time 8    Period Weeks    Status New    Target Date 08/10/21                   Plan - 07/07/21 1932     Clinical Impression Statement Patient demonstrates improving gait pattern with improving weight shift to left LE. She does still have mildly decreased stance time on LLE during gait. She exhibits improved L hip external rotation ROM as needed for managing lower limb during self-care activities (accessing FABER position) e.g. dressing and bathing activities. She has ongoing hip abduction stiffness. She has consistent reports of pain at follow-up PT visits. Pt does need further work on pain control and hip mobility; additionally patient needs further work on strengthening, pain reduction, and progressive weightbearing activity to improve gait and functional mobility as needed for best return to PLOF.    Personal Factors and Comorbidities Age;Comorbidity 3+;Time since onset of injury/illness/exacerbation    Comorbidities CAD, acid reflux, s/p TIA    Examination-Activity Limitations Bed Mobility;Stairs;Stand;Dressing;Bathing;Bend;Locomotion Level;Squat    Examination-Participation Restrictions Community Activity;Cleaning;Laundry    Stability/Clinical Decision Making Evolving/Moderate complexity    Rehab Potential Good    PT Frequency 2x / week    PT Duration 8 weeks    PT Treatment/Interventions ADLs/Self Care Home Management    PT Next  Visit Plan  Manual therapy with distraction techniques and STM for symptom modulation, hip ROM, unloaded isotonics with gradual transition to progressive weightbearing and CKC strengthening    PT Home Exercise Plan Access Code [phone].              Patient will benefit from skilled therapeutic intervention in order to improve the following deficits and impairments:  Abnormal gait, Hypomobility, Difficulty walking, Decreased strength, Pain, Decreased range of motion  Visit Diagnosis: Pain in left hip  Difficulty in walking, not elsewhere classified  Muscle weakness (generalized)  Stiffness of left hip, not elsewhere classified     Problem List Patient Active Problem List   Diagnosis Date Noted   Unstable angina ([place]) 01/19/2017   CAD (coronary artery disease) 01/19/2017     Encounter Date: 07/06/2021  Valentina Gu, PT, DPT [phone] Eilleen Kempf 07/07/2021, 7:33 PM  [facility] [address] [place], [place], [address] Phone: [phone]   Fax:  [phone]   Patient Details  Name: Kristen Lloyd MRN: [id number] Date of Birth: 03-12-1941 Referring Provider:  Meeler, Sherren Kerns, FNP

## 2021-07-08 ENCOUNTER — Encounter: Payer: Medicare Other | Admitting: Physical Therapy

## 2021-07-09 ENCOUNTER — Ambulatory Visit: Payer: Medicare Other | Admitting: Physical Therapy

## 2021-07-09 ENCOUNTER — Other Ambulatory Visit: Payer: Self-pay

## 2021-07-09 DIAGNOSIS — M25552 Pain in left hip: Secondary | ICD-10-CM

## 2021-07-09 DIAGNOSIS — R262 Difficulty in walking, not elsewhere classified: Secondary | ICD-10-CM

## 2021-07-09 DIAGNOSIS — M25652 Stiffness of left hip, not elsewhere classified: Secondary | ICD-10-CM

## 2021-07-09 DIAGNOSIS — M6281 Muscle weakness (generalized): Secondary | ICD-10-CM

## 2021-07-09 NOTE — Therapy (Signed)
[facility] [address] [place], [place], [address] Phone: [phone]   Fax:  [phone]  Physical Therapy Treatment  Patient Details  Name: Kristen Lloyd MRN: [id number] Date of Birth: 1941/02/03 Referring Provider (PT): Allene Dillon, FNP   Encounter Date: 07/09/2021   PT End of Session - 07/09/21 1632     Visit Number 7    Number of Visits 17    Date for PT Re-Evaluation 08/11/21    Authorization - Visit Number 7    Authorization - Number of Visits 10    Progress Note Due on Visit 10    PT Start Time 1601    PT Stop Time 4709    PT Time Calculation (min) 46 min    Activity Tolerance Patient tolerated treatment well;Patient limited by pain    Behavior During Therapy [facility] for tasks assessed/performed               Past Medical History:  Diagnosis Date   Acid reflux    Coronary artery disease    Diverticulitis    Stroke [facility])     Past Surgical History:  Procedure Laterality Date   ABDOMINAL HYSTERECTOMY     BREAST BIOPSY Left 1990's   lt bx x 2-neg   CARDIAC CATHETERIZATION N/A 01/19/2017   Procedure: Left Heart Cath and Coronary Angiography;  Surgeon: Teodoro Spray, MD;  Location: [facility];  Service: Cardiovascular;  Laterality: N/A;   CARDIAC CATHETERIZATION N/A 01/19/2017   Procedure: Coronary Stent Intervention;  Surgeon: Isaias Cowman, MD;  Location: [place] [facility];  Service: Cardiovascular;  Laterality: N/A;   CARDIAC SURGERY     Ileostomy and reversal N/A 2021   TONSILLECTOMY      There were no vitals filed for this visit.   Subjective Assessment - 07/09/21 1604     Subjective Patient reports doing well yesterday with walking with [facility] for long distance around [place]. She reports her hip is bothering her more today versus during the activity yesterday. Patient reports pain affecting L groin and gluteal/L lower lumbar region. She reports "L knee is acting up"  today. She reports no major complaints following last PT session. Patient reports 7-8/10 pain at arrival today.    Pertinent History Patient is an 80 year old female with primary complaint of L hip pain. Patient has referring diagnosis of L hip OA. Patient reports about 2 years of hip pain. She reports she was doing senior fitness program at [facility] [employer] at the time her symptoms began. She states that the staff noticed a change in her gait pattern and she felt minmal pain. This has since worsened. Patient states she was very sick last year and didn't do as much exercise at the time. Patient had ileostomy and reversal in 2021 with significant GI symptoms - significant recovery time required and patient had notably decreased physical activity. Patient did have steroid injection 05/19/2021 that did provide moderate relief. She feels that later in the day she is getting pain into thoracolumbar region from the effect her condition has on her walking. She states that during the day it "isn't that bad." Patient reports no night pain. Pain is worse in AM. Hx of TIA in 2015. No numbness or tingling. Patient stated goals: to avoid surgery and to walk better.    Limitations Standing;Walking;House hold activities    How long can you stand comfortably? 5-10 minutes    How long can you walk  comfortably? 30 minutes    Diagnostic tests L hip radiographs; evidence of OA/degenerative change    Patient Stated Goals To avoid surgery, able to walk better    Currently in Pain? Yes    Pain Score 8               Treatment Performed   NuStep x 5 minutes, L2; seat 6, arms 7 (unbilled time)     Manual Therapy - for joint and soft tissue mobility, symptom modulation, hip ROM  L hip long-axis distraction in supine for axial decompression of LLE weightbearing joints L hip PROM within pt tolerance STM/IASTM with Theraband roller along rectus femoris and vastus lateralis STM and IASTM with Hypervolt along bilat L3-L5  erector spinae (emphasis on L side) and L gluteal mm in R sidelying   *not today* L hip posterior mobilization (axial load through femur) with conjunct passive ER/IR within pt tolerance L hip lateral distraction with Mulligan belt, intermittent L hip MWM (c belt distraction) with hip flexion      Therapeutic exercise - for improved strength as needed for ability to perform transferring, weightbearing activity, obstacle negotiation; for soft tissue mobility and ROM    Lower trunk rotations; x10 alternating Sit to stand, raised table; 2x10 [employer], blue swiss ball; 3-way (forward,  L and R diagonal); x5 ea dir Seated knee extension; 2x10, bilateral; 4-lb cuff weight    *not today* Butterfly stretch; 3x30sec, hooklying Open book; x10 ea side SLR; 2x10 Glute bridge; 2x10 Butterfly stretch; 3x30sec, hooklying     Neuromuscular Re-education - for movement control and trunk stabilization, targeting cueing for gluteal activation Clamshell; 2x10, R sidelying [verbal cueing, tactile cueing, and demo for technique and to prevent trunk rotation compensation 3-way Hip, standing with bilat UE support; x8 ea dir, bilat Standing march; x10 alternating with unilateral table support     *not today* Pelvic tilt; 2x10, hooklying, anterior/posterior [minimal verbal and tactile cueing]     Multimodal cueing including tactile and verbal cueing as well as exercise demonstration utilized throughout session to optimize technique. Home exercise program discussed and reviewed throughout session.        ASSESSMENT Patient demonstrates improving gait pattern with improving weight shift to left LE. She does not have notable change in pain with use of axial distraction, but she does respond well with use of IASTM. Patient is improving in ability to complete moderate volume weightbearing activity in clinic. Modestly progressed gluteal isotonic exercise with patient tolerating exercises well. Pt  unfortunately is having difficulty with getting out of current pain cycle with left lower quarter pain affecting various regions (lower lumbar paraspinal, iliolumbar, gluteal, groin, L thigh and knee). Pt does need further work on pain control and hip mobility; additionally patient needs further work on strengthening, pain reduction, and progressive weightbearing activity to improve gait and functional mobility as needed for best return to PLOF.      PT Short Term Goals - 06/16/21 1813       PT SHORT TERM GOAL #1   Title Pt will be independent and 100% compliant with established HEP and activity modification as needed to augment PT intervention and improve pertinent strength and mobility deficits as needed for best return to prior level of function.    Baseline HEP provided at IE 06/15/21    Time 3    Period Weeks    Status New    Target Date 07/06/21      PT SHORT TERM GOAL #2  Title Patient will perform independent sit to stand without UE support and no increase in pain indicative of improved functional LE strength and ability to perform transferring necessary for accessing home and community environment    Baseline Significant difficulty with sit to stand with heavy UE support required    Time 4    Period Weeks    Status New    Target Date 07/13/21               PT Long Term Goals - 06/16/21 1824       PT LONG TERM GOAL #1   Title Patient will demonstrate improved function as evidenced by a score of 59 on FOTO measure for full participation in activities at home and in the community.    Baseline 06/15/21: 50    Time 8    Period Weeks    Status New    Target Date 08/10/21      PT LONG TERM GOAL #2   Title Patient will perform consecutive hurdle step over 3 6-inch hurdles with good heel to toe progression, no LOB, no buckling off affected LE, and no increase in pain indicative of improved ability to weight shift to affected LE and clear lower limb over obstacles as needed for  functional mobility    Baseline Antalgic gait pattern wth decreased LLE weight shift    Time 8    Period Weeks    Status New    Target Date 08/10/21      PT LONG TERM GOAL #3   Title Patient will improve MMT to 4+/5 for all tested LE musculature indicative of improved strength as needed for improved performance of transferring and as needed to produce power to prevent LOB during episode of large postural perturbation    Baseline MMT 3+ to 4/5 for hip musculature (flexion, ER, IR, ABD), 4/5 for hamstrings and ankle dorsiflexors    Time 8    Period Weeks    Status New    Target Date 08/10/21      PT LONG TERM GOAL #4   Title Patient will ambulate x 300 feet in clinic with no AD with symmetrical weight shift and normal heel to toe progression without LOB and no reproduction of pain indicative of improved community-level gait    Baseline Gait with antalgic pattern and decreased stance time LLE    Time 8    Period Weeks    Status New    Target Date 08/10/21                   Plan - 07/10/21 1622     Clinical Impression Statement Patient demonstrates improving gait pattern with improving weight shift to left LE. She does not have notable change in pain with use of axial distraction, but she does respond well with use of IASTM. Patient is improving in ability to complete moderate volume weightbearing activity in clinic. Modestly progressed gluteal isotonic exercise with patient tolerating exercises well. Pt unfortunately is having difficulty with getting out of current pain cycle with left lower quarter pain affecting various regions (lower lumbar paraspinal, iliolumbar, gluteal, groin, L thigh and knee). Pt does need further work on pain control and hip mobility; additionally patient needs further work on strengthening, pain reduction, and progressive weightbearing activity to improve gait and functional mobility as needed for best return to PLOF.    Personal Factors and Comorbidities  Age;Comorbidity 3+;Time since onset of injury/illness/exacerbation    Comorbidities CAD, acid reflux,  s/p TIA    Examination-Activity Limitations Bed Mobility;Stairs;Stand;Dressing;Bathing;Bend;Locomotion Level;Squat    Examination-Participation Restrictions Community Activity;Cleaning;Laundry    Stability/Clinical Decision Making Evolving/Moderate complexity    Rehab Potential Good    PT Frequency 2x / week    PT Duration 8 weeks    PT Treatment/Interventions ADLs/Self Care Home Management    PT Next Visit Plan Manual therapy with distraction techniques and STM for symptom modulation, hip ROM, unloaded isotonics with gradual transition to progressive weightbearing and CKC strengthening    PT Home Exercise Plan Access Code [phone].               Patient will benefit from skilled therapeutic intervention in order to improve the following deficits and impairments:  Abnormal gait, Hypomobility, Difficulty walking, Decreased strength, Pain, Decreased range of motion  Visit Diagnosis: Pain in left hip  Difficulty in walking, not elsewhere classified  Muscle weakness (generalized)  Stiffness of left hip, not elsewhere classified     Problem List Patient Active Problem List   Diagnosis Date Noted   Unstable angina ([place]) 01/19/2017   CAD (coronary artery disease) 01/19/2017     Encounter Date: 07/06/2021  Valentina Gu, PT, DPT [phone] Eilleen Kempf 07/10/2021, 4:25 PM   [facility] [address] [place], [place], [address] Phone: [phone]   Fax:  [phone]   Patient Details  Name: Kristen Lloyd MRN: [id number] Date of Birth: 1941-07-04 Referring Provider:  Meeler, Sherren Kerns, FNP

## 2021-07-10 ENCOUNTER — Encounter: Payer: Self-pay | Admitting: Physical Therapy

## 2021-07-13 ENCOUNTER — Encounter: Payer: Self-pay | Admitting: Physical Therapy

## 2021-07-13 ENCOUNTER — Ambulatory Visit: Payer: Medicare Other | Admitting: Physical Therapy

## 2021-07-13 ENCOUNTER — Other Ambulatory Visit: Payer: Self-pay

## 2021-07-13 DIAGNOSIS — M25552 Pain in left hip: Secondary | ICD-10-CM | POA: Diagnosis not present

## 2021-07-13 DIAGNOSIS — M25652 Stiffness of left hip, not elsewhere classified: Secondary | ICD-10-CM

## 2021-07-13 DIAGNOSIS — M6281 Muscle weakness (generalized): Secondary | ICD-10-CM

## 2021-07-13 DIAGNOSIS — R262 Difficulty in walking, not elsewhere classified: Secondary | ICD-10-CM

## 2021-07-13 NOTE — Therapy (Signed)
[facility] [address] [place], [place], [address] Phone: [phone]   Fax:  [phone]  Physical Therapy Treatment  Patient Details  Name: Kristen Lloyd MRN: [id number] Date of Birth: 1941/12/22 Referring Provider (PT): Allene Dillon, FNP   Encounter Date: 07/13/2021   PT End of Session - 07/13/21 2238     Visit Number 8    Number of Visits 17    Date for PT Re-Evaluation 08/11/21    Authorization - Visit Number 8    Authorization - Number of Visits 10    Progress Note Due on Visit 10    PT Start Time 9983    PT Stop Time 1626    PT Time Calculation (min) 44 min    Activity Tolerance Patient tolerated treatment well;Patient limited by pain    Behavior During Therapy [facility] for tasks assessed/performed             Past Medical History:  Diagnosis Date   Acid reflux    Coronary artery disease    Diverticulitis    Stroke [facility])     Past Surgical History:  Procedure Laterality Date   ABDOMINAL HYSTERECTOMY     BREAST BIOPSY Left 1990's   lt bx x 2-neg   CARDIAC CATHETERIZATION N/A 01/19/2017   Procedure: Left Heart Cath and Coronary Angiography;  Surgeon: Teodoro Spray, MD;  Location: [place] [facility];  Service: Cardiovascular;  Laterality: N/A;   CARDIAC CATHETERIZATION N/A 01/19/2017   Procedure: Coronary Stent Intervention;  Surgeon: Isaias Cowman, MD;  Location: [place] [facility];  Service: Cardiovascular;  Laterality: N/A;   CARDIAC SURGERY     Ileostomy and reversal N/A 2021   TONSILLECTOMY      There were no vitals filed for this visit.   Subjective Assessment - 07/13/21 1547     Subjective Patient reports her knees "haven't been so bad." She states she did take 1/2 of her Tramadol dose last evening. Patient reports her hip is feeling "not too bad" at arrival to PT. Patient reports her knees did bother her more over the weekend. Patient reports 5-6/10 pain at arrival to PT with most pain  affecting L lateral thigh and groin region.    Pertinent History Patient is an 80 year old female with primary complaint of L hip pain. Patient has referring diagnosis of L hip OA. Patient reports about 2 years of hip pain. She reports she was doing senior fitness program at [employer] at the time her symptoms began. She states that the staff noticed a change in her gait pattern and she felt minmal pain. This has since worsened. Patient states she was very sick last year and didn't do as much exercise at the time. Patient had ileostomy and reversal in 2021 with significant GI symptoms - significant recovery time required and patient had notably decreased physical activity. Patient did have steroid injection 05/19/2021 that did provide moderate relief. She feels that later in the day she is getting pain into thoracolumbar region from the effect her condition has on her walking. She states that during the day it "isn't that bad." Patient reports no night pain. Pain is worse in AM. Hx of TIA in 2015. No numbness or tingling. Patient stated goals: to avoid surgery and to walk better.    Limitations Standing;Walking;House hold activities    How long can you stand comfortably? 5-10 minutes    How long can you walk comfortably? 30 minutes  Diagnostic tests L hip radiographs; evidence of OA/degenerative change    Patient Stated Goals To avoid surgery, able to walk better    Currently in Pain? Yes    Pain Score 6               Treatment Performed   NuStep x 5 minutes, L2; seat 6, arms 7 (unbilled time)     Manual Therapy - for joint and soft tissue mobility, symptom modulation, hip ROM    L hip PROM within pt tolerance STM/IASTM with Theraband roller along rectus femoris and vastus lateralis L hip posterior mobilization (axial load through femur) with conjunct passive ER/IR within pt tolerance   *not today* STM and IASTM with Hypervolt along bilat L3-L5 erector spinae (emphasis on L side)  and L gluteal mm in R sidelying L hip long-axis distraction in supine for axial decompression of LLE weightbearing joints L hip lateral distraction with Mulligan belt, intermittent L hip MWM (c belt distraction) with hip flexion       Therapeutic exercise - for improved strength as needed for ability to perform transferring, weightbearing activity, obstacle negotiation; for soft tissue mobility and ROM  Sit to stand, raised table; 2x10 Seated knee extension; 2x10, bilateral; 5-lb cuff weight      *not today* [employer], blue swiss ball; 3-way (forward,  L and R diagonal); x5 ea dir Lower trunk rotations; x10 alternating Butterfly stretch; 3x30sec, hooklying Open book; x10 ea side SLR; 2x10 Glute bridge; 2x10 Butterfly stretch; 3x30sec, hooklying     Neuromuscular Re-education - for movement control and trunk stabilization, targeting cueing for gluteal activation  Clamshell; 2x10, R sidelying [verbal cueing, tactile cueing, and demo for technique and to prevent trunk rotation compensation]  In // bars: 3-way Hip, standing with bilat UE support; x5 ea dir, bilat, with 2-lb cuff weight Dynamic march; x5 D/B length of // bars   *not today* Standing march; x10 alternating with unilateral table support  Pelvic tilt; 2x10, hooklying, anterior/posterior [minimal verbal and tactile cueing]     Multimodal cueing including tactile and verbal cueing as well as exercise demonstration utilized throughout session to optimize technique. Home exercise program discussed and reviewed throughout session.        ASSESSMENT Patient has moderate pain level reported at previous visit - significantly less than that reported after aggravating activity this previous week (walking around [employer]). She demonstrates good hip flexion ROM at this time, but has notable hip ER and ABD stiffness and moderate motion loss into IR. She is able to significantly progress with weightbearing  activity and weight-shifting without significant increase in pain. Patient has improved gait pattern that does worsen intermittently following prolonged sitting/static position. Pt needs further work on strengthening, pain reduction, and progressive weightbearing activity to improve gait and functional mobility as needed for best return to PLOF.     PT Short Term Goals - 06/16/21 1813       PT SHORT TERM GOAL #1   Title Pt will be independent and 100% compliant with established HEP and activity modification as needed to augment PT intervention and improve pertinent strength and mobility deficits as needed for best return to prior level of function.    Baseline HEP provided at IE 06/15/21    Time 3    Period Weeks    Status New    Target Date 07/06/21      PT SHORT TERM GOAL #2   Title Patient will perform independent sit to  stand without UE support and no increase in pain indicative of improved functional LE strength and ability to perform transferring necessary for accessing home and community environment    Baseline Significant difficulty with sit to stand with heavy UE support required    Time 4    Period Weeks    Status New    Target Date 07/13/21               PT Long Term Goals - 06/16/21 1824       PT LONG TERM GOAL #1   Title Patient will demonstrate improved function as evidenced by a score of 59 on FOTO measure for full participation in activities at home and in the community.    Baseline 06/15/21: 50    Time 8    Period Weeks    Status New    Target Date 08/10/21      PT LONG TERM GOAL #2   Title Patient will perform consecutive hurdle step over 3 6-inch hurdles with good heel to toe progression, no LOB, no buckling off affected LE, and no increase in pain indicative of improved ability to weight shift to affected LE and clear lower limb over obstacles as needed for functional mobility    Baseline Antalgic gait pattern wth decreased LLE weight shift    Time 8     Period Weeks    Status New    Target Date 08/10/21      PT LONG TERM GOAL #3   Title Patient will improve MMT to 4+/5 for all tested LE musculature indicative of improved strength as needed for improved performance of transferring and as needed to produce power to prevent LOB during episode of large postural perturbation    Baseline MMT 3+ to 4/5 for hip musculature (flexion, ER, IR, ABD), 4/5 for hamstrings and ankle dorsiflexors    Time 8    Period Weeks    Status New    Target Date 08/10/21      PT LONG TERM GOAL #4   Title Patient will ambulate x 300 feet in clinic with no AD with symmetrical weight shift and normal heel to toe progression without LOB and no reproduction of pain indicative of improved community-level gait    Baseline Gait with antalgic pattern and decreased stance time LLE    Time 8    Period Weeks    Status New    Target Date 08/10/21                   Plan - 07/13/21 2244     Clinical Impression Statement Patient has moderate pain level reported at previous visit - significantly less than that reported after aggravating activity this previous week (walking around [employer]). She demonstrates good hip flexion ROM at this time, but has notable hip ER and ABD stiffness and moderate motion loss into IR. She is able to significantly progress with weightbearing activity and weight-shifting without significant increase in pain. Patient has improved gait pattern that does worsen intermittently following prolonged sitting/static position. Pt needs further work on strengthening, pain reduction, and progressive weightbearing activity to improve gait and functional mobility as needed for best return to PLOF.    Personal Factors and Comorbidities Age;Comorbidity 3+;Time since onset of injury/illness/exacerbation    Comorbidities CAD, acid reflux, s/p TIA    Examination-Activity Limitations Bed Mobility;Stairs;Stand;Dressing;Bathing;Bend;Locomotion Level;Squat     Examination-Participation Restrictions Community Activity;Cleaning;Laundry    Stability/Clinical Decision Making Evolving/Moderate complexity  Rehab Potential Good    PT Frequency 2x / week    PT Duration 8 weeks    PT Treatment/Interventions ADLs/Self Care Home Management    PT Next Visit Plan Manual therapy with coxafemoral mobilization techniques and STM for symptom modulation, hip ROM, unloaded isotonics with gradual transition to progressive weightbearing and CKC strengthening    PT Home Exercise Plan Access Code [phone].             Patient will benefit from skilled therapeutic intervention in order to improve the following deficits and impairments:  Abnormal gait, Hypomobility, Difficulty walking, Decreased strength, Pain, Decreased range of motion  Visit Diagnosis: Pain in left hip  Difficulty in walking, not elsewhere classified  Muscle weakness (generalized)  Stiffness of left hip, not elsewhere classified     Problem List Patient Active Problem List   Diagnosis Date Noted   Unstable angina ([place]) 01/19/2017   CAD (coronary artery disease) 01/19/2017   Valentina Gu, PT, DPT #[id number] Eilleen Kempf 07/13/2021, 10:45 PM  [place] [facility] [address] [place], [place], [address] Phone: [phone]   Fax:  [phone]  Name: Kristen Lloyd MRN: [id number] Date of Birth: 10/05/1941

## 2021-07-15 ENCOUNTER — Ambulatory Visit: Payer: Medicare Other | Admitting: Physical Therapy

## 2021-07-15 ENCOUNTER — Other Ambulatory Visit: Payer: Self-pay

## 2021-07-15 DIAGNOSIS — M6281 Muscle weakness (generalized): Secondary | ICD-10-CM

## 2021-07-15 DIAGNOSIS — R262 Difficulty in walking, not elsewhere classified: Secondary | ICD-10-CM

## 2021-07-15 DIAGNOSIS — M25652 Stiffness of left hip, not elsewhere classified: Secondary | ICD-10-CM

## 2021-07-15 DIAGNOSIS — M25552 Pain in left hip: Secondary | ICD-10-CM | POA: Diagnosis not present
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
## 2021-07-16 ENCOUNTER — Encounter: Payer: Self-pay | Admitting: Physical Therapy

## 2021-07-20 ENCOUNTER — Other Ambulatory Visit: Payer: Self-pay

## 2021-07-20 ENCOUNTER — Encounter: Payer: Self-pay | Admitting: Physical Therapy

## 2021-07-20 ENCOUNTER — Ambulatory Visit: Payer: Medicare Other | Admitting: Physical Therapy

## 2021-07-20 DIAGNOSIS — M25552 Pain in left hip: Secondary | ICD-10-CM | POA: Diagnosis not present

## 2021-07-20 DIAGNOSIS — R262 Difficulty in walking, not elsewhere classified: Secondary | ICD-10-CM

## 2021-07-20 DIAGNOSIS — M25652 Stiffness of left hip, not elsewhere classified: Secondary | ICD-10-CM

## 2021-07-20 DIAGNOSIS — M6281 Muscle weakness (generalized): Secondary | ICD-10-CM

## 2021-07-20 NOTE — Therapy (Signed)
[facility] [address] [place], [place], [address] Phone: [phone]   Fax:  [phone]  Physical Therapy Treatment  Patient Details  Name: Kristen Lloyd MRN: [id number] Date of Birth: 1941/10/12 Referring Provider (PT): Allene Dillon, FNP   Encounter Date: 07/20/2021   PT End of Session - 07/21/21 1322     Visit Number 10    Number of Visits 17    Date for PT Re-Evaluation 08/11/21    Progress Note Due on Visit 52    PT Start Time 0346    PT Stop Time 0427    PT Time Calculation (min) 41 min    Activity Tolerance Patient tolerated treatment well;Patient limited by pain    Behavior During Therapy [facility] for tasks assessed/performed             Past Medical History:  Diagnosis Date   Acid reflux    Coronary artery disease    Diverticulitis    Stroke [facility])     Past Surgical History:  Procedure Laterality Date   ABDOMINAL HYSTERECTOMY     BREAST BIOPSY Left 1990's   lt bx x 2-neg   CARDIAC CATHETERIZATION N/A 01/19/2017   Procedure: Left Heart Cath and Coronary Angiography;  Surgeon: Teodoro Spray, MD;  Location: [place] [facility];  Service: Cardiovascular;  Laterality: N/A;   CARDIAC CATHETERIZATION N/A 01/19/2017   Procedure: Coronary Stent Intervention;  Surgeon: Isaias Cowman, MD;  Location: [place] [facility];  Service: Cardiovascular;  Laterality: N/A;   CARDIAC SURGERY     Ileostomy and reversal N/A 2021   TONSILLECTOMY      There were no vitals filed for this visit.   Subjective Assessment - 07/20/21 1551     Subjective Patient reports feeling "not too bad" at arrival to PT. She reports some discomfort affecting her L groin. Patient reports her L knee is painful - she states "at times it is better." 5-6/10 pain. She reports no notable iliolumbar or gluteal pain at arrival to PT.    Pertinent History Patient is an 80 year old female with primary complaint of L hip pain. Patient has referring  diagnosis of L hip OA. Patient reports about 2 years of hip pain. She reports she was doing senior fitness program at [employer] [facility] at the time her symptoms began. She states that the staff noticed a change in her gait pattern and she felt minmal pain. This has since worsened. Patient states she was very sick last year and didn't do as much exercise at the time. Patient had ileostomy and reversal in 2021 with significant GI symptoms - significant recovery time required and patient had notably decreased physical activity. Patient did have steroid injection 05/19/2021 that did provide moderate relief. She feels that later in the day she is getting pain into thoracolumbar region from the effect her condition has on her walking. She states that during the day it "isn't that bad." Patient reports no night pain. Pain is worse in AM. Hx of TIA in 2015. No numbness or tingling. Patient stated goals: to avoid surgery and to walk better.    Limitations Standing;Walking;House hold activities    How long can you stand comfortably? 5-10 minutes    How long can you walk comfortably? 30 minutes    Diagnostic tests L hip radiographs; evidence of OA/degenerative change    Patient Stated Goals To avoid surgery, able to walk better    Currently in Pain? Yes  Pain Score 6               Treatment Performed   NuStep x 5 minutes, L2; seat 6, arms 7 (unbilled time)       Manual Therapy - for joint and soft tissue mobility, symptom modulation, hip ROM     L hip PROM within pt tolerance STM/IASTM with Theraband roller along rectus femoris and vastus lateralis L hip posterior mobilization (axial load through femur) with conjunct passive ER/IR within pt tolerance L hip gentle adductor stretch; 3x30sec   *not today* STM and IASTM with Hypervolt along bilat L3-L5 erector spinae (emphasis on L side) and L gluteal mm in R sidelying L hip long-axis distraction in supine for axial decompression of LLE  weightbearing joints L hip lateral distraction with Mulligan belt, intermittent L hip MWM (c belt distraction) with hip flexion       Therapeutic exercise - for improved strength as needed for ability to perform transferring, weightbearing activity, obstacle negotiation; for soft tissue mobility and ROM   Sit to stand, raised table; 2x10, with 4-lb Med ball goblet hold Seated knee extension; 2x10, bilateral; 6-lb cuff weight  Supine hip abduction AROM; x20     *not today* [employer], blue swiss ball; 3-way (forward,  L and R diagonal); x5 ea dir Lower trunk rotations; x10 alternating Butterfly stretch; 3x30sec, hooklying Open book; x10 ea side SLR; 2x10 Glute bridge; 2x10 Butterfly stretch; 3x30sec, hooklying       Neuromuscular Re-education - for movement control and trunk stabilization, targeting cueing for gluteal activation     In // bars: 3-way Hip, standing with bilat UE support; x5 ea dir, bilat, with 2-lb cuff weight Dynamic march; x5 D/B length of // bars Tandem stance; 2x30 seconds, bilaterally     *not today* Clamshell; 2x10, R sidelying [verbal cueing, tactile cueing, and demo for technique and to prevent trunk rotation compensation] Standing march; x10 alternating with unilateral table support  Pelvic tilt; 2x10, hooklying, anterior/posterior [minimal verbal and tactile cueing]     Multimodal cueing including tactile and verbal cueing as well as exercise demonstration utilized throughout session to optimize technique. Home exercise program discussed and reviewed throughout session.        ASSESSMENT Patient exhibits modest improvement in her gait pattern at this time, though her antalgic pattern can be exacerbated following prolonged static sitting. She is able to further progress demands on muscle performance and CKC activities today. She has notably improved hip ROM, but she still has moderate deficit in hip abduction and internal rotation. She  feels she has made progress with PT, but still needs work on pain reduction, strength, and her gait. Patient will benefit from continued skilled PT intervention to address the above impairments and activity limitations for best return to PLOF.            PT Short Term Goals - 07/16/21 1339       PT SHORT TERM GOAL #1   Title Pt will be independent and 100% compliant with established HEP and activity modification as needed to augment PT intervention and improve pertinent strength and mobility deficits as needed for best return to prior level of function.    Baseline HEP provided at IE 06/15/21. 07/16/21: compliant and independent with exercise technique    Time 3    Period Weeks    Status Achieved    Target Date 07/06/21      PT SHORT TERM GOAL #2   Title Patient  will perform independent sit to stand without UE support and no increase in pain indicative of improved functional LE strength and ability to perform transferring necessary for accessing home and community environment    Baseline IE: Significant difficulty with sit to stand with heavy UE support required. 07/16/21: performed with moderate L hip pain c weightbearing    Time 4    Period Weeks    Status Partially Met    Target Date 07/30/21               PT Long Term Goals - 07/16/21 1335       PT LONG TERM GOAL #1   Title Patient will demonstrate improved function as evidenced by a score of 59 on FOTO measure for full participation in activities at home and in the community.    Baseline 06/15/21: 50. 07/15/21: 49.    Time 8    Period Weeks    Status Not Met    Target Date 08/10/21      PT LONG TERM GOAL #2   Title Patient will perform consecutive hurdle step over 3 6-inch hurdles with good heel to toe progression, no LOB, no buckling off affected LE, and no increase in pain indicative of improved ability to weight shift to affected LE and clear lower limb over obstacles as needed for functional mobility    Baseline IE:  Antalgic gait pattern wth decreased LLE weight shift. 07/15/21: able to perform dynamic march in // bars with sound weight shift    Time 8    Period Weeks    Status Partially Met    Target Date 08/10/21      PT LONG TERM GOAL #3   Title Patient will improve MMT to 4+/5 for all tested LE musculature indicative of improved strength as needed for improved performance of transferring and as needed to produce power to prevent LOB during episode of large postural perturbation    Baseline IE: MMT 3+ to 4/5 for hip musculature (flexion, ER, IR, ABD), 4/5 for hamstrings and ankle dorsiflexors. 07/15/21: Not met for ER/IR/ABD. otherwise 4+ or greater.    Time 8    Period Weeks    Status Partially Met    Target Date 08/10/21      PT LONG TERM GOAL #4   Title Patient will ambulate x 300 feet in clinic with no AD with symmetrical weight shift and normal heel to toe progression without LOB and no reproduction of pain indicative of improved community-level gait    Baseline IE: Gait with antalgic pattern and decreased stance time LLE. 07/15/21: improving weight shift to LLE, though pt still demonstrates moderate antalgic pattern that is worse following prolonged sitting    Time 8    Period Weeks    Status Partially Met    Target Date 08/10/21                   Plan - 07/21/21 1326     Clinical Impression Statement Patient exhibits modest improvement in her gait pattern at this time, though her antalgic pattern can be exacerbated following prolonged static sitting. She is able to further progress demands on muscle performance and CKC activities today. She has notably improved hip ROM, but she still has moderate deficit in hip abduction and internal rotation. She feels she has made progress with PT, but still needs work on pain reduction, strength, and her gait. Patient will benefit from continued skilled PT intervention to address the above impairments  and activity limitations for best return to PLOF.     Personal Factors and Comorbidities Age;Comorbidity 3+;Time since onset of injury/illness/exacerbation    Comorbidities CAD, acid reflux, s/p TIA    Examination-Activity Limitations Bed Mobility;Stairs;Stand;Dressing;Bathing;Bend;Locomotion Level;Squat    Examination-Participation Restrictions Community Activity;Cleaning;Laundry    Stability/Clinical Decision Making Evolving/Moderate complexity    Rehab Potential Good    PT Frequency 2x / week    PT Duration 8 weeks    PT Treatment/Interventions ADLs/Self Care Home Management    PT Next Visit Plan Manual therapy with coxafemoral mobilization techniques and STM for symptom modulation, hip ROM, unloaded isotonics with gradual transition to progressive weightbearing and CKC strengthening    PT Home Exercise Plan Access Code [phone].             Patient will benefit from skilled therapeutic intervention in order to improve the following deficits and impairments:  Abnormal gait, Hypomobility, Difficulty walking, Decreased strength, Pain, Decreased range of motion  Visit Diagnosis: Pain in left hip  Difficulty in walking, not elsewhere classified  Muscle weakness (generalized)  Stiffness of left hip, not elsewhere classified     Problem List Patient Active Problem List   Diagnosis Date Noted   Unstable angina ([place]) 01/19/2017   CAD (coronary artery disease) 01/19/2017   Valentina Gu, PT, DPT #[id number]  Eilleen Kempf 07/21/2021, 1:28 PM  [facility] [address] [place], [place], [address] Phone: [phone]   Fax:  [phone]  Name: Kristen Lloyd MRN: [id number] Date of Birth: [birth year]

## 2021-07-21 ENCOUNTER — Encounter: Payer: Self-pay | Admitting: Physical Therapy

## 2021-07-22 ENCOUNTER — Ambulatory Visit: Payer: Medicare Other | Admitting: Physical Therapy

## 2021-07-22 ENCOUNTER — Other Ambulatory Visit: Payer: Self-pay

## 2021-07-22 DIAGNOSIS — M6281 Muscle weakness (generalized): Secondary | ICD-10-CM

## 2021-07-22 DIAGNOSIS — R262 Difficulty in walking, not elsewhere classified: Secondary | ICD-10-CM

## 2021-07-22 DIAGNOSIS — M25552 Pain in left hip: Secondary | ICD-10-CM

## 2021-07-22 DIAGNOSIS — M25652 Stiffness of left hip, not elsewhere classified: Secondary | ICD-10-CM

## 2021-07-22 NOTE — Therapy (Signed)
[place] [facility] [address] [place], [place], [address] Phone: [phone]   Fax:  [phone]  Physical Therapy Treatment  Patient Details  Name: Kristen Lloyd MRN: [id number] Date of Birth: [birth year] Referring Provider (PT): Allene Dillon, FNP   Encounter Date: 07/22/2021   PT End of Session - 07/24/21 0946     Visit Number 11    Number of Visits 17    Date for PT Re-Evaluation 08/11/21    Progress Note Due on Visit 68    PT Start Time 0348    PT Stop Time 0430    PT Time Calculation (min) 42 min    Activity Tolerance Patient tolerated treatment well;Patient limited by pain    Behavior During Therapy [facility] for tasks assessed/performed             Past Medical History:  Diagnosis Date   Acid reflux    Coronary artery disease    Diverticulitis    Stroke [place])     Past Surgical History:  Procedure Laterality Date   ABDOMINAL HYSTERECTOMY     BREAST BIOPSY Left 1990's   lt bx x 2-neg   CARDIAC CATHETERIZATION N/A 01/19/2017   Procedure: Left Heart Cath and Coronary Angiography;  Surgeon: Teodoro Spray, MD;  Location: [place] [facility];  Service: Cardiovascular;  Laterality: N/A;   CARDIAC CATHETERIZATION N/A 01/19/2017   Procedure: Coronary Stent Intervention;  Surgeon: Isaias Cowman, MD;  Location: [place] [facility];  Service: Cardiovascular;  Laterality: N/A;   CARDIAC SURGERY     Ileostomy and reversal N/A 2021   TONSILLECTOMY      There were no vitals filed for this visit.   Subjective Assessment - 07/24/21 0945     Subjective She reports more pain affecting L lower limb today without significant increase in activity or novel activity over the last 2 days. She reports pain affecting L anterolateral thigh region and down to L anterior knee. She reports significant variability in symptoms from day to day and sometimes within the same day. 6-7/10 pain at arrival to PT.    Pertinent History Patient is  an 80 year old female with primary complaint of L hip pain. Patient has referring diagnosis of L hip OA. Patient reports about 2 years of hip pain. She reports she was doing senior fitness program at [facility] at the time her symptoms began. She states that the staff noticed a change in her gait pattern and she felt minmal pain. This has since worsened. Patient states she was very sick last year and didn't do as much exercise at the time. Patient had ileostomy and reversal in 2021 with significant GI symptoms - significant recovery time required and patient had notably decreased physical activity. Patient did have steroid injection 05/19/2021 that did provide moderate relief. She feels that later in the day she is getting pain into thoracolumbar region from the effect her condition has on her walking. She states that during the day it "isn't that bad." Patient reports no night pain. Pain is worse in AM. Hx of TIA in 2015. No numbness or tingling. Patient stated goals: to avoid surgery and to walk better.    Limitations Standing;Walking;House hold activities    How long can you stand comfortably? 5-10 minutes    How long can you walk comfortably? 30 minutes    Diagnostic tests L hip radiographs; evidence of OA/degenerative change    Patient Stated Goals To avoid surgery, able to  walk better    Currently in Pain? Yes    Pain Score 7               Treatment Performed   NuStep x 5 minutes, L2; seat 6, arms 7 (unbilled time)     Manual Therapy - for joint and soft tissue mobility, symptom modulation, hip ROM     L hip PROM within pt tolerance STM/IASTM with Theraband roller along rectus femoris and vastus lateralis, adductor longus and magnus L hip posterior mobilization (axial load through femur) with conjunct passive ER/IR within pt tolerance STM and IASTM with Hypervolt along bilat L3-L5 erector spinae (emphasis on L side) and L gluteal mm in R sidelying L hip gentle adductor stretch;  3x30sec   *not today*  L hip long-axis distraction in supine for axial decompression of LLE weightbearing joints L hip lateral distraction with Mulligan belt, intermittent L hip MWM (c belt distraction) with hip flexion      Therapeutic exercise - for improved strength as needed for ability to perform transferring, weightbearing activity, obstacle negotiation; for soft tissue mobility and ROM   Lower trunk rotations; x10 alternating Sit to stand, raised table; 2x10, with 4-lb Med ball goblet hold Supine hip abduction AROM; x20   *not today* Seated knee extension; 2x10, bilateral; 6-lb cuff weight [employer], blue swiss ball; 3-way (forward,  L and R diagonal); x5 ea dir Butterfly stretch; 3x30sec, hooklying Open book; x10 ea side SLR; 2x10 Glute bridge; 2x10 Butterfly stretch; 3x30sec, hooklying         Neuromuscular Re-education - for movement control and trunk stabilization, targeting cueing for gluteal activation     In // bars: Dynamic march; x5 D/B length of // bars   *next visit* 3-way Hip, standing with bilat UE support; x5 ea dir, bilat, with 2-lb cuff weight Tandem stance; 2x30 seconds, bilaterally     *not today* Clamshell; 2x10, R sidelying [verbal cueing, tactile cueing, and demo for technique and to prevent trunk rotation compensation] Standing march; x10 alternating with unilateral table support  Pelvic tilt; 2x10, hooklying, anterior/posterior [minimal verbal and tactile cueing]     Multimodal cueing including tactile and verbal cueing as well as exercise demonstration utilized throughout session to optimize technique. Home exercise program discussed and reviewed throughout session.        ASSESSMENT Emphasis on closed chain exercise was decreased today due to patient having relatively more pain than earlier this week. She exhibits improvement in hip ROM, but she may plateau with mobility attained given chronicity of mobility deficits and  age-related changes. She has frequent changes in pain status with exacerbations between visits with no major aggravating factor/activity reported. She is tolerating  therapeutic activities well, but she still has significant gait changes, L lower limb pain, and difficulty with prolonged weightbearing work. She does feel that back pain with bed mobility has gotten better. She feels she has made progress with PT, but still needs work on pain reduction, strength, and her gait. Patient will benefit from continued skilled PT intervention to address the above impairments and activity limitations for best return to PLOF.           PT Short Term Goals - 07/16/21 1339       PT SHORT TERM GOAL #1   Title Pt will be independent and 100% compliant with established HEP and activity modification as needed to augment PT intervention and improve pertinent strength and mobility deficits as needed for best return to  prior level of function.    Baseline HEP provided at IE 06/15/21. 07/16/21: compliant and independent with exercise technique    Time 3    Period Weeks    Status Achieved    Target Date 07/06/21      PT SHORT TERM GOAL #2   Title Patient will perform independent sit to stand without UE support and no increase in pain indicative of improved functional LE strength and ability to perform transferring necessary for accessing home and community environment    Baseline IE: Significant difficulty with sit to stand with heavy UE support required. 07/16/21: performed with moderate L hip pain c weightbearing    Time 4    Period Weeks    Status Partially Met    Target Date 07/30/21               PT Long Term Goals - 07/16/21 1335       PT LONG TERM GOAL #1   Title Patient will demonstrate improved function as evidenced by a score of 59 on FOTO measure for full participation in activities at home and in the community.    Baseline 06/15/21: 50. 07/15/21: 49.    Time 8    Period Weeks    Status Not  Met    Target Date 08/10/21      PT LONG TERM GOAL #2   Title Patient will perform consecutive hurdle step over 3 6-inch hurdles with good heel to toe progression, no LOB, no buckling off affected LE, and no increase in pain indicative of improved ability to weight shift to affected LE and clear lower limb over obstacles as needed for functional mobility    Baseline IE: Antalgic gait pattern wth decreased LLE weight shift. 07/15/21: able to perform dynamic march in // bars with sound weight shift    Time 8    Period Weeks    Status Partially Met    Target Date 08/10/21      PT LONG TERM GOAL #3   Title Patient will improve MMT to 4+/5 for all tested LE musculature indicative of improved strength as needed for improved performance of transferring and as needed to produce power to prevent LOB during episode of large postural perturbation    Baseline IE: MMT 3+ to 4/5 for hip musculature (flexion, ER, IR, ABD), 4/5 for hamstrings and ankle dorsiflexors. 07/15/21: Not met for ER/IR/ABD. otherwise 4+ or greater.    Time 8    Period Weeks    Status Partially Met    Target Date 08/10/21      PT LONG TERM GOAL #4   Title Patient will ambulate x 300 feet in clinic with no AD with symmetrical weight shift and normal heel to toe progression without LOB and no reproduction of pain indicative of improved community-level gait    Baseline IE: Gait with antalgic pattern and decreased stance time LLE. 07/15/21: improving weight shift to LLE, though pt still demonstrates moderate antalgic pattern that is worse following prolonged sitting    Time 8    Period Weeks    Status Partially Met    Target Date 08/10/21                   Plan - 07/24/21 0954     Clinical Impression Statement Emphasis on closed chain exercise was decreased today due to patient having relatively more pain than earlier this week. She exhibits improvement in hip ROM, but she may plateau with mobility  attained given chronicity  of mobility deficits and age-related changes. She has frequent changes in pain status with exacerbations between visits with no major aggravating factor/activity reported. She is tolerating  therapeutic activities well, but she still has significant gait changes, L lower limb pain, and difficulty with prolonged weightbearing work. She does feel that back pain with bed mobility has gotten better. She feels she has made progress with PT, but still needs work on pain reduction, strength, and her gait. Patient will benefit from continued skilled PT intervention to address the above impairments and activity limitations for best return to PLOF.    Personal Factors and Comorbidities Age;Comorbidity 3+;Time since onset of injury/illness/exacerbation    Comorbidities CAD, acid reflux, s/p TIA    Examination-Activity Limitations Bed Mobility;Stairs;Stand;Dressing;Bathing;Bend;Locomotion Level;Squat    Examination-Participation Restrictions Community Activity;Cleaning;Laundry    Stability/Clinical Decision Making Evolving/Moderate complexity    Rehab Potential Good    PT Frequency 2x / week    PT Duration 8 weeks    PT Treatment/Interventions ADLs/Self Care Home Management    PT Next Visit Plan Manual therapy with coxafemoral mobilization techniques and STM for symptom modulation, hip ROM, unloaded isotonics with gradual transition to progressive weightbearing and CKC strengthening    PT Home Exercise Plan Access Code [phone].             Patient will benefit from skilled therapeutic intervention in order to improve the following deficits and impairments:  Abnormal gait, Hypomobility, Difficulty walking, Decreased strength, Pain, Decreased range of motion  Visit Diagnosis: Pain in left hip  Difficulty in walking, not elsewhere classified  Muscle weakness (generalized)  Stiffness of left hip, not elsewhere classified     Problem List Patient Active Problem List   Diagnosis Date Noted    Unstable angina ([place]) 01/19/2017   CAD (coronary artery disease) 01/19/2017   Valentina Gu, PT, DPT #[id number]  Eilleen Kempf 07/24/2021, 9:58 AM  [place] [facility] [address] [place], [place], [address] Phone: [phone]   Fax:  [phone]  Name: ALBERTA LENHARD MRN: [id number] Date of Birth: 13-Apr-1941

## 2021-07-28 ENCOUNTER — Encounter: Payer: Self-pay | Admitting: Physical Therapy

## 2021-07-28 ENCOUNTER — Ambulatory Visit: Payer: Medicare Other | Attending: Family Medicine | Admitting: Physical Therapy

## 2021-07-28 ENCOUNTER — Other Ambulatory Visit: Payer: Self-pay

## 2021-07-28 DIAGNOSIS — R262 Difficulty in walking, not elsewhere classified: Secondary | ICD-10-CM | POA: Diagnosis present

## 2021-07-28 DIAGNOSIS — M25552 Pain in left hip: Secondary | ICD-10-CM | POA: Insufficient documentation

## 2021-07-28 DIAGNOSIS — M25652 Stiffness of left hip, not elsewhere classified: Secondary | ICD-10-CM | POA: Insufficient documentation

## 2021-07-28 DIAGNOSIS — M6281 Muscle weakness (generalized): Secondary | ICD-10-CM | POA: Diagnosis present

## 2021-07-28 NOTE — Therapy (Signed)
[place] [facility] [address] [place], [place], [address] Phone: [phone]   Fax:  [phone]  Physical Therapy Treatment  Patient Details  Name: Kristen Lloyd MRN: [id number] Date of Birth: [birth year] Referring Provider (PT): Allene Dillon, FNP   Encounter Date: 07/28/2021   PT End of Session - 07/30/21 0809     Visit Number 12    Number of Visits 17    Date for PT Re-Evaluation 08/11/21    Progress Note Due on Visit 17    PT Start Time 1601    PT Stop Time 1645    PT Time Calculation (min) 44 min    Activity Tolerance Patient tolerated treatment well;Patient limited by pain    Behavior During Therapy [facility] for tasks assessed/performed             Past Medical History:  Diagnosis Date   Acid reflux    Coronary artery disease    Diverticulitis    Stroke [facility])     Past Surgical History:  Procedure Laterality Date   ABDOMINAL HYSTERECTOMY     BREAST BIOPSY Left 1990's   lt bx x 2-neg   CARDIAC CATHETERIZATION N/A 01/19/2017   Procedure: Left Heart Cath and Coronary Angiography;  Surgeon: Teodoro Spray, MD;  Location: [place] [facility];  Service: Cardiovascular;  Laterality: N/A;   CARDIAC CATHETERIZATION N/A 01/19/2017   Procedure: Coronary Stent Intervention;  Surgeon: Isaias Cowman, MD;  Location: [place] [facility];  Service: Cardiovascular;  Laterality: N/A;   CARDIAC SURGERY     Ileostomy and reversal N/A 2021   TONSILLECTOMY      There were no vitals filed for this visit.   Subjective Assessment - 07/30/21 0811     Subjective Patient reports 5/10 pain at arrival to PT. She reports variable pain that is worse with weightbearing/walking. Patient reports completing high volume of walking the previous day to go to multiple appointments and she utilized [facility] - she felt that she tolerated this well with use of AD. She reports tolerating last session well. She reports improved back pain with bed mobility  tasks.    Pertinent History Patient is an 80 year old female with primary complaint of L hip pain. Patient has referring diagnosis of L hip OA. Patient reports about 2 years of hip pain. She reports she was doing senior fitness program at [facility] at the time her symptoms began. She states that the staff noticed a change in her gait pattern and she felt minmal pain. This has since worsened. Patient states she was very sick last year and didn't do as much exercise at the time. Patient had ileostomy and reversal in 2021 with significant GI symptoms - significant recovery time required and patient had notably decreased physical activity. Patient did have steroid injection 05/19/2021 that did provide moderate relief. She feels that later in the day she is getting pain into thoracolumbar region from the effect her condition has on her walking. She states that during the day it "isn't that bad." Patient reports no night pain. Pain is worse in AM. Hx of TIA in 2015. No numbness or tingling. Patient stated goals: to avoid surgery and to walk better.    Limitations Standing;Walking;House hold activities    How long can you stand comfortably? 5-10 minutes    How long can you walk comfortably? 30 minutes    Diagnostic tests L hip radiographs; evidence of OA/degenerative change    Patient Stated Goals  To avoid surgery, able to walk better               Treatment Performed   NuStep x 5 minutes, L2; seat 6, arms 7 (unbilled time)     Manual Therapy - for joint and soft tissue mobility, symptom modulation, hip ROM   L hip PROM within pt tolerance STM/IASTM with Theraband roller along rectus femoris and vastus lateralis, adductor longus and magnus L hip gentle adductor stretch; 3x30sec   *not today* L hip posterior mobilization (axial load through femur) with conjunct passive ER/IR within pt tolerance  STM and IASTM with Hypervolt along bilat L3-L5 erector spinae (emphasis on L side) and L  gluteal mm in R sidelying L hip long-axis distraction in supine for axial decompression of LLE weightbearing joints L hip lateral distraction with Mulligan belt, intermittent L hip MWM (c belt distraction) with hip flexion      Therapeutic exercise - for improved strength as needed for ability to perform transferring, weightbearing activity, obstacle negotiation; for soft tissue mobility and ROM   Lower trunk rotations; x10 alternating Sit to stand, raised table; 2x10, with 4-lb Med ball goblet hold Seated knee extension; 2x10, bilateral; 7.5-lb cuff weight   *not today* Supine hip abduction AROM; x20 [employer], blue swiss ball; 3-way (forward,  L and R diagonal); x5 ea dir Butterfly stretch; 3x30sec, hooklying Open book; x10 ea side SLR; 2x10 Glute bridge; 2x10 Butterfly stretch; 3x30sec, hooklying         Neuromuscular Re-education - for movement control and trunk stabilization, targeting cueing for gluteal activation     In // bars: Dynamic march; x5 D/B length of // bars Hurdle stepping, 3 6-inch hurdles; 4x down/back length of // bars    *next visit* 3-way Hip, standing with bilat UE support; x5 ea dir, bilat, with 2-lb cuff weight Tandem stance; 2x30 seconds, bilaterally     *not today* Clamshell; 2x10, R sidelying [verbal cueing, tactile cueing, and demo for technique and to prevent trunk rotation compensation] Standing march; x10 alternating with unilateral table support  Pelvic tilt; 2x10, hooklying, anterior/posterior [minimal verbal and tactile cueing]     Multimodal cueing including tactile and verbal cueing as well as exercise demonstration utilized throughout session to optimize technique. Home exercise program discussed and reviewed throughout session.        ASSESSMENT Patient is able to continue with standing weight shifting drills and modestly increasing emphasis on closed-chain strengthening. She has ongoing antalgic pattern with stance on  LLE, though this is less pronounced compared to outset of PT. Pt is able to meet established goal for consecutive hurdle stepping. She does feel that back pain with bed mobility has gotten better. She feels she has made progress with PT, but still needs work on pain reduction, strength, and her gait. Patient will benefit from continued skilled PT intervention to address the above impairments and activity limitations for best return to PLOF.       PT Short Term Goals - 07/16/21 1339       PT SHORT TERM GOAL #1   Title Pt will be independent and 100% compliant with established HEP and activity modification as needed to augment PT intervention and improve pertinent strength and mobility deficits as needed for best return to prior level of function.    Baseline HEP provided at IE 06/15/21. 07/16/21: compliant and independent with exercise technique    Time 3    Period Weeks    Status Achieved  Target Date 07/06/21      PT SHORT TERM GOAL #2   Title Patient will perform independent sit to stand without UE support and no increase in pain indicative of improved functional LE strength and ability to perform transferring necessary for accessing home and community environment    Baseline IE: Significant difficulty with sit to stand with heavy UE support required. 07/16/21: performed with moderate L hip pain c weightbearing    Time 4    Period Weeks    Status Partially Met    Target Date 07/30/21               PT Long Term Goals - 07/30/21 1010       PT LONG TERM GOAL #1   Title Patient will demonstrate improved function as evidenced by a score of 59 on FOTO measure for full participation in activities at home and in the community.    Baseline 06/15/21: 50. 07/15/21: 49.    Time 8    Period Weeks    Status Not Met    Target Date 08/10/21      PT LONG TERM GOAL #2   Title Patient will perform consecutive hurdle step over 3 6-inch hurdles with good heel to toe progression, no LOB, no  buckling off affected LE, and no increase in pain indicative of improved ability to weight shift to affected LE and clear lower limb over obstacles as needed for functional mobility    Baseline IE: Antalgic gait pattern wth decreased LLE weight shift. 07/15/21: able to perform dynamic march in // bars with sound weight shift. 07/28/21: performed in parallel bars with no LOB and no net increase in pain/sypmtoms    Time 8    Period Weeks    Status Achieved    Target Date 08/10/21      PT LONG TERM GOAL #3   Title Patient will improve MMT to 4+/5 for all tested LE musculature indicative of improved strength as needed for improved performance of transferring and as needed to produce power to prevent LOB during episode of large postural perturbation    Baseline IE: MMT 3+ to 4/5 for hip musculature (flexion, ER, IR, ABD), 4/5 for hamstrings and ankle dorsiflexors. 07/15/21: Not met for ER/IR/ABD. otherwise 4+ or greater.    Time 8    Period Weeks    Status Partially Met    Target Date 08/10/21      PT LONG TERM GOAL #4   Title Patient will ambulate x 300 feet in clinic with no AD with symmetrical weight shift and normal heel to toe progression without LOB and no reproduction of pain indicative of improved community-level gait    Baseline IE: Gait with antalgic pattern and decreased stance time LLE. 07/15/21: improving weight shift to LLE, though pt still demonstrates moderate antalgic pattern that is worse following prolonged sitting    Time 8    Period Weeks    Status Partially Met    Target Date 08/10/21                   Plan - 07/30/21 1010     Clinical Impression Statement Patient is able to continue with standing weight shifting drills and modestly increasing emphasis on closed-chain strengthening. She has ongoing antalgic pattern with stance on LLE, though this is less pronounced compared to outset of PT. Pt is able to meet established goal for consecutive hurdle stepping. She does  feel that back pain with bed  mobility has gotten better. She feels she has made progress with PT, but still needs work on pain reduction, strength, and her gait. Patient will benefit from continued skilled PT intervention to address the above impairments and activity limitations for best return to PLOF.    Personal Factors and Comorbidities Age;Comorbidity 3+;Time since onset of injury/illness/exacerbation    Comorbidities CAD, acid reflux, s/p TIA    Examination-Activity Limitations Bed Mobility;Stairs;Stand;Dressing;Bathing;Bend;Locomotion Level;Squat    Examination-Participation Restrictions Community Activity;Cleaning;Laundry    Stability/Clinical Decision Making Evolving/Moderate complexity    Rehab Potential Good    PT Frequency 2x / week    PT Duration 8 weeks    PT Treatment/Interventions ADLs/Self Care Home Management    PT Next Visit Plan Manual therapy with coxafemoral mobilization techniques and STM for symptom modulation, hip ROM, unloaded isotonics with gradual transition to progressive weightbearing and CKC strengthening    PT Home Exercise Plan Access Code [phone].             Patient will benefit from skilled therapeutic intervention in order to improve the following deficits and impairments:  Abnormal gait, Hypomobility, Difficulty walking, Decreased strength, Pain, Decreased range of motion  Visit Diagnosis: Pain in left hip  Difficulty in walking, not elsewhere classified  Muscle weakness (generalized)  Stiffness of left hip, not elsewhere classified     Problem List Patient Active Problem List   Diagnosis Date Noted   Unstable angina ([place]) 01/19/2017   CAD (coronary artery disease) 01/19/2017   Valentina Gu, PT, DPT #[id number]  Eilleen Kempf 07/30/2021, 10:14 AM  [place] [facility] [address] [place], [place], [address] Phone: [phone]   Fax:  [phone]  Name: SHANTOYA GEURTS MRN: [id number] Date  of Birth: 07-Jan-1941

## 2021-07-30 ENCOUNTER — Encounter: Payer: Self-pay | Admitting: Physical Therapy

## 2021-07-30 ENCOUNTER — Other Ambulatory Visit: Payer: Self-pay

## 2021-07-30 ENCOUNTER — Ambulatory Visit: Payer: Medicare Other | Admitting: Physical Therapy

## 2021-07-30 DIAGNOSIS — M25552 Pain in left hip: Secondary | ICD-10-CM | POA: Diagnosis not present

## 2021-07-30 DIAGNOSIS — M25652 Stiffness of left hip, not elsewhere classified: Secondary | ICD-10-CM

## 2021-07-30 DIAGNOSIS — M6281 Muscle weakness (generalized): Secondary | ICD-10-CM

## 2021-07-30 DIAGNOSIS — R262 Difficulty in walking, not elsewhere classified: Secondary | ICD-10-CM

## 2021-07-30 NOTE — Therapy (Signed)
[facility] [address] [place], [place], [address] Phone: [phone]   Fax:  [phone]  Physical Therapy Treatment  Patient Details  Name: Kristen Lloyd MRN: [id number] Date of Birth: [birth year] Referring Provider (PT): Allene Dillon, FNP   Encounter Date: 07/30/2021   PT End of Session - 07/31/21 1142     Visit Number 13    Number of Visits 17    Date for PT Re-Evaluation 08/11/21    Authorization Time Period Cert 5/73-2/20    Authorization - Visit Number --    Progress Note Due on Visit 62    PT Start Time 1605    PT Stop Time 1646    PT Time Calculation (min) 41 min    Activity Tolerance Patient tolerated treatment well;Patient limited by pain    Behavior During Therapy [facility] for tasks assessed/performed             Past Medical History:  Diagnosis Date   Acid reflux    Coronary artery disease    Diverticulitis    Stroke [facility])     Past Surgical History:  Procedure Laterality Date   ABDOMINAL HYSTERECTOMY     BREAST BIOPSY Left 1990's   lt bx x 2-neg   CARDIAC CATHETERIZATION N/A 01/19/2017   Procedure: Left Heart Cath and Coronary Angiography;  Surgeon: Teodoro Spray, MD;  Location: [place] [facility];  Service: Cardiovascular;  Laterality: N/A;   CARDIAC CATHETERIZATION N/A 01/19/2017   Procedure: Coronary Stent Intervention;  Surgeon: Isaias Cowman, MD;  Location: [place] [facility];  Service: Cardiovascular;  Laterality: N/A;   CARDIAC SURGERY     Ileostomy and reversal N/A 2021   TONSILLECTOMY      There were no vitals filed for this visit.   Subjective Assessment - 07/31/21 1140     Subjective Patient reports 5/10 pain at arrival to PT today. She reports pain is mainly affecting L groin region at this time. She denies significant gluteal pain or thigh pain at this time. She reports intermittent L knee pain. Patient reports tolerating last session well; she is compliant with home program.  Patient just had follow-up with referring provider. Pt is to continue PT/conservative management at this time and may consider injections if her condition worsens. Patient's husband has procedure at [facility] tomorrow and she will have to spend ample time walking around hospital.    Pertinent History Patient is an 80 year old female with primary complaint of L hip pain. Patient has referring diagnosis of L hip OA. Patient reports about 2 years of hip pain. She reports she was doing senior fitness program at [facility] at the time her symptoms began. She states that the staff noticed a change in her gait pattern and she felt minmal pain. This has since worsened. Patient states she was very sick last year and didn't do as much exercise at the time. Patient had ileostomy and reversal in 2021 with significant GI symptoms - significant recovery time required and patient had notably decreased physical activity. Patient did have steroid injection 05/19/2021 that did provide moderate relief. She feels that later in the day she is getting pain into thoracolumbar region from the effect her condition has on her walking. She states that during the day it "isn't that bad." Patient reports no night pain. Pain is worse in AM. Hx of TIA in 2015. No numbness or tingling. Patient stated goals: to avoid surgery and to walk better.  Limitations Standing;Walking;House hold activities    How long can you stand comfortably? 5-10 minutes    How long can you walk comfortably? 30 minutes    Diagnostic tests L hip radiographs; evidence of OA/degenerative change    Patient Stated Goals To avoid surgery, able to walk better    Currently in Pain? Yes    Pain Score 5                  Treatment Performed   NuStep x 5 minutes, L3; seat 6, arms 7 (unbilled time)     Manual Therapy - for joint and soft tissue mobility, symptom modulation, hip ROM   L hip PROM within pt tolerance STM/IASTM with Theraband roller  along rectus femoris and vastus lateralis, adductor longus and magnus L hip gentle adductor stretch; 3x30sec   *not today* L hip posterior mobilization (axial load through femur) with conjunct passive ER/IR within pt tolerance  STM and IASTM with Hypervolt along bilat L3-L5 erector spinae (emphasis on L side) and L gluteal mm in R sidelying L hip long-axis distraction in supine for axial decompression of LLE weightbearing joints L hip lateral distraction with Mulligan belt, intermittent L hip MWM (c belt distraction) with hip flexion      Therapeutic exercise - for improved strength as needed for ability to perform transferring, weightbearing activity, obstacle negotiation; for soft tissue mobility and ROM   Seated knee extension; 2x10, bilateral; 7.5-lb cuff weight Supine hip abduction AROM; x20 Sidelying hip abduction; 2x6   *not today* Lower trunk rotations; x10 alternating Sit to stand, raised table; 2x10, with 4-lb Med ball goblet hold  [employer], blue swiss ball; 3-way (forward,  L and R diagonal); x5 ea dir Butterfly stretch; 3x30sec, hooklying Open book; x10 ea side SLR; 2x10 Glute bridge; 2x10 Butterfly stretch; 3x30sec, hooklying         Neuromuscular Re-education - for movement control and trunk stabilization, targeting cueing for gluteal activation     In // bars: Hurdle stepping, 3 6-inch hurdles; 4x down/back length of // bars 3-way Hip, standing with bilat UE support; x5 ea dir, bilat, with 2-lb cuff weight Tandem stance; 2x30 seconds, bilaterally     *not today* Dynamic march; x5 D/B length of // bars Clamshell; 2x10, R sidelying [verbal cueing, tactile cueing, and demo for technique and to prevent trunk rotation compensation] Standing march; x10 alternating with unilateral table support  Pelvic tilt; 2x10, hooklying, anterior/posterior [minimal verbal and tactile cueing]     Multimodal cueing including tactile and verbal cueing as well as  exercise demonstration utilized throughout session to optimize technique. Home exercise program discussed and reviewed throughout session.        ASSESSMENT Patient has ongoing difficulty with prolonged weightbearing on LLE with decreased stance time on LLE during gait with moderate antalgic pattern. Patient has improved with gluteal strength and hip mobility. She does intermittently have high levels of pain following larger volumes of standing activity (e.g. negotiating large facility/hospital). Continued progression of standing/closed-chain drills today and pt tolerates today's session well. Discussed utilization of [facility] for negotiating [facility] tomorrow and completing walking in smaller bouts as able with using pain as a guide. She feels she has made progress with PT, but still needs work on pain reduction, strength, and her gait. Patient will benefit from continued skilled PT intervention to address the above impairments and activity limitations for best return to PLOF.         PT Short Term  Goals - 07/16/21 1339       PT SHORT TERM GOAL #1   Title Pt will be independent and 100% compliant with established HEP and activity modification as needed to augment PT intervention and improve pertinent strength and mobility deficits as needed for best return to prior level of function.    Baseline HEP provided at IE 06/15/21. 07/16/21: compliant and independent with exercise technique    Time 3    Period Weeks    Status Achieved    Target Date 07/06/21      PT SHORT TERM GOAL #2   Title Patient will perform independent sit to stand without UE support and no increase in pain indicative of improved functional LE strength and ability to perform transferring necessary for accessing home and community environment    Baseline IE: Significant difficulty with sit to stand with heavy UE support required. 07/16/21: performed with moderate L hip pain c weightbearing    Time 4    Period Weeks    Status  Partially Met    Target Date 07/30/21               PT Long Term Goals - 07/30/21 1010       PT LONG TERM GOAL #1   Title Patient will demonstrate improved function as evidenced by a score of 59 on FOTO measure for full participation in activities at home and in the community.    Baseline 06/15/21: 50. 07/15/21: 49.    Time 8    Period Weeks    Status Not Met    Target Date 08/10/21      PT LONG TERM GOAL #2   Title Patient will perform consecutive hurdle step over 3 6-inch hurdles with good heel to toe progression, no LOB, no buckling off affected LE, and no increase in pain indicative of improved ability to weight shift to affected LE and clear lower limb over obstacles as needed for functional mobility    Baseline IE: Antalgic gait pattern wth decreased LLE weight shift. 07/15/21: able to perform dynamic march in // bars with sound weight shift. 07/28/21: performed in parallel bars with no LOB and no net increase in pain/sypmtoms    Time 8    Period Weeks    Status Achieved    Target Date 08/10/21      PT LONG TERM GOAL #3   Title Patient will improve MMT to 4+/5 for all tested LE musculature indicative of improved strength as needed for improved performance of transferring and as needed to produce power to prevent LOB during episode of large postural perturbation    Baseline IE: MMT 3+ to 4/5 for hip musculature (flexion, ER, IR, ABD), 4/5 for hamstrings and ankle dorsiflexors. 07/15/21: Not met for ER/IR/ABD. otherwise 4+ or greater.    Time 8    Period Weeks    Status Partially Met    Target Date 08/10/21      PT LONG TERM GOAL #4   Title Patient will ambulate x 300 feet in clinic with no AD with symmetrical weight shift and normal heel to toe progression without LOB and no reproduction of pain indicative of improved community-level gait    Baseline IE: Gait with antalgic pattern and decreased stance time LLE. 07/15/21: improving weight shift to LLE, though pt still demonstrates  moderate antalgic pattern that is worse following prolonged sitting    Time 8    Period Weeks    Status Partially Met  Target Date 08/10/21                   Plan - 07/31/21 1154     Clinical Impression Statement Patient has ongoing difficulty with prolonged weightbearing on LLE with decreased stance time on LLE during gait with moderate antalgic pattern. Patient has improved with gluteal strength and hip mobility. She does intermittently have high levels of pain following larger volumes of standing activity (e.g. negotiating large facility/hospital). Continued progression of standing/closed-chain drills today and pt tolerates today's session well. Discussed utilization of [facility] for negotiating [facility] tomorrow and completing walking in smaller bouts as able with using pain as a guide. She feels she has made progress with PT, but still needs work on pain reduction, strength, and her gait. Patient will benefit from continued skilled PT intervention to address the above impairments and activity limitations for best return to PLOF.    Personal Factors and Comorbidities Age;Comorbidity 3+;Time since onset of injury/illness/exacerbation    Comorbidities CAD, acid reflux, s/p TIA    Examination-Activity Limitations Bed Mobility;Stairs;Stand;Dressing;Bathing;Bend;Locomotion Level;Squat    Examination-Participation Restrictions Community Activity;Cleaning;Laundry    Stability/Clinical Decision Making Evolving/Moderate complexity    Rehab Potential Good    PT Frequency 2x / week    PT Duration 8 weeks    PT Treatment/Interventions ADLs/Self Care Home Management    PT Next Visit Plan Manual therapy with coxafemoral mobilization techniques and STM for symptom modulation, hip ROM, unloaded isotonics with gradual transition to progressive weightbearing and CKC strengthening    PT Home Exercise Plan Access Code [phone].             Patient will benefit from skilled therapeutic  intervention in order to improve the following deficits and impairments:  Abnormal gait, Hypomobility, Difficulty walking, Decreased strength, Pain, Decreased range of motion  Visit Diagnosis: Pain in left hip  Difficulty in walking, not elsewhere classified  Muscle weakness (generalized)  Stiffness of left hip, not elsewhere classified     Problem List Patient Active Problem List   Diagnosis Date Noted   Unstable angina ([place]) 01/19/2017   CAD (coronary artery disease) 01/19/2017   Valentina Gu, PT, DPT #[id number]  Eilleen Kempf 07/31/2021, 11:54 AM  [facility] [address] [place], [place], [address] Phone: [phone]   Fax:  [phone]  Name: Kristen Lloyd MRN: [id number] Date of Birth: October 21, 1941

## 2021-08-04 ENCOUNTER — Other Ambulatory Visit: Payer: Self-pay

## 2021-08-04 ENCOUNTER — Encounter: Payer: Self-pay | Admitting: Physical Therapy

## 2021-08-04 ENCOUNTER — Ambulatory Visit: Payer: Medicare Other | Admitting: Physical Therapy

## 2021-08-04 DIAGNOSIS — M25552 Pain in left hip: Secondary | ICD-10-CM

## 2021-08-04 DIAGNOSIS — R262 Difficulty in walking, not elsewhere classified: Secondary | ICD-10-CM

## 2021-08-04 DIAGNOSIS — M25652 Stiffness of left hip, not elsewhere classified: Secondary | ICD-10-CM

## 2021-08-04 DIAGNOSIS — M6281 Muscle weakness (generalized): Secondary | ICD-10-CM

## 2021-08-04 NOTE — Therapy (Signed)
[facility] [address] [place], [place], [address] Phone: [phone]   Fax:  [phone]  Physical Therapy Treatment  Patient Details  Name: GURBANI FIGGE MRN: [id number] Date of Birth: [birth year] Referring Provider (PT): Allene Dillon, FNP   Encounter Date: 08/04/2021   PT End of Session - 08/04/21 1556     Visit Number 14    Number of Visits 17    Date for PT Re-Evaluation 08/11/21    Authorization Time Period Cert 6/97-9/48    Progress Note Due on Visit 33    PT Start Time 1552    PT Stop Time 1643    PT Time Calculation (min) 51 min    Activity Tolerance Patient tolerated treatment well;Patient limited by pain    Behavior During Therapy [facility] for tasks assessed/performed             Past Medical History:  Diagnosis Date   Acid reflux    Coronary artery disease    Diverticulitis    Stroke [facility])     Past Surgical History:  Procedure Laterality Date   ABDOMINAL HYSTERECTOMY     BREAST BIOPSY Left 1990's   lt bx x 2-neg   CARDIAC CATHETERIZATION N/A 01/19/2017   Procedure: Left Heart Cath and Coronary Angiography;  Surgeon: Teodoro Spray, MD;  Location: [place] [facility];  Service: Cardiovascular;  Laterality: N/A;   CARDIAC CATHETERIZATION N/A 01/19/2017   Procedure: Coronary Stent Intervention;  Surgeon: Isaias Cowman, MD;  Location: [place] [facility];  Service: Cardiovascular;  Laterality: N/A;   CARDIAC SURGERY     Ileostomy and reversal N/A 2021   TONSILLECTOMY      There were no vitals filed for this visit.   Subjective Assessment - 08/04/21 1554     Subjective Patient reports 5/10 pain at arrival to PT. Patient reports walking short distance at [facility] and felt she did okay with this. The staff did provide wheelchair transport due patient feeling that she did not have energy to walk from hospital tothe parking lot. Patient reports pain mainly affecting L gluteal region and L lateral  hip at arrival to PT. Patient reports doing well with her exercises with exception of Friday.    Pertinent History Patient is an 80 year old female with primary complaint of L hip pain. Patient has referring diagnosis of L hip OA. Patient reports about 2 years of hip pain. She reports she was doing senior fitness program at [facility] at the time her symptoms began. She states that the staff noticed a change in her gait pattern and she felt minmal pain. This has since worsened. Patient states she was very sick last year and didn't do as much exercise at the time. Patient had ileostomy and reversal in 2021 with significant GI symptoms - significant recovery time required and patient had notably decreased physical activity. Patient did have steroid injection 05/19/2021 that did provide moderate relief. She feels that later in the day she is getting pain into thoracolumbar region from the effect her condition has on her walking. She states that during the day it "isn't that bad." Patient reports no night pain. Pain is worse in AM. Hx of TIA in 2015. No numbness or tingling. Patient stated goals: to avoid surgery and to walk better.    Limitations Standing;Walking;House hold activities    How long can you stand comfortably? 5-10 minutes    How long can you walk comfortably? 30 minutes  Diagnostic tests L hip radiographs; evidence of OA/degenerative change    Patient Stated Goals To avoid surgery, able to walk better    Currently in Pain? Yes    Pain Score 5                                        Treatment Performed   NuStep x 5 minutes, L3; seat 6, arms 7 (unbilled time)     Manual Therapy - for joint and soft tissue mobility, symptom modulation, hip ROM   L hip PROM within pt tolerance  STM and IASTM with Hypervolt along bilat L3-L5 erector spinae (emphasis on L side) and L gluteal mm in R sidelying L hip gentle adductor stretch; 3x30sec   *not  today* STM/IASTM with Theraband roller along rectus femoris and vastus lateralis, adductor longus and magnus L hip posterior mobilization (axial load through femur) with conjunct passive ER/IR within pt tolerance  L hip long-axis distraction in supine for axial decompression of LLE weightbearing joints L hip lateral distraction with Mulligan belt, intermittent L hip MWM (c belt distraction) with hip flexion      Therapeutic exercise - for improved strength as needed for ability to perform transferring, weightbearing activity, obstacle negotiation; for soft tissue mobility and ROM  Lower trunk rotations; x10 alternating Supine hip abduction AROM; x20 Sidelying hip abduction; 2x8 Sit to stand, raised table; 2x10, with 6-lb Med ball goblet hold  *not today* Seated knee extension; 2x10, bilateral; 7.5-lb cuff weight [employer], blue swiss ball; 3-way (forward,  L and R diagonal); x5 ea dir Butterfly stretch; 3x30sec, hooklying Open book; x10 ea side SLR; 2x10 Glute bridge; 2x10 Butterfly stretch; 3x30sec, hooklying         Neuromuscular Re-education - for movement control and trunk stabilization, targeting cueing for gluteal activation   Sidestep with Red Tband at distal shins; 3x down/back, 10-ft course  3-way Hip, standing with bilat UE support; x10 ea dir, bilat, with 2-lb cuff weight      *not today* Hurdle stepping, 3 6-inch hurdles; 4x down/back length of // bars Tandem stance; 2x30 seconds, bilaterally Dynamic march; x5 D/B length of // bars Clamshell; 2x10, R sidelying [verbal cueing, tactile cueing, and demo for technique and to prevent trunk rotation compensation] Standing march; x10 alternating with unilateral table support  Pelvic tilt; 2x10, hooklying, anterior/posterior [minimal verbal and tactile cueing]      Multimodal cueing including tactile and verbal cueing as well as exercise demonstration utilized throughout session to optimize technique. Home  exercise program discussed and reviewed throughout session.        ASSESSMENT Patient did tolerate walking at [facility] fairly well this past Friday, but she did feel notable fatigue and receive wheelchair transport when leaving hospital late Friday (she did not feel that she was limited due to musculoskeletal pain). Patient exhibits improving antalgic pattern with decreased abductor lurch on LLE. She reports ongoing pain around 5/10, but she has significantly improved ability to accept weight onto LLE and standing activity tolerance. She feels she has made progress with PT, but still needs work on pain reduction, strength, and her gait. Patient will benefit from continued skilled PT intervention to address the above impairments and activity limitations for best return to PLOF.     PT Short Term Goals - 07/16/21 1339       PT SHORT TERM GOAL #1  Title Pt will be independent and 100% compliant with established HEP and activity modification as needed to augment PT intervention and improve pertinent strength and mobility deficits as needed for best return to prior level of function.    Baseline HEP provided at IE 06/15/21. 07/16/21: compliant and independent with exercise technique    Time 3    Period Weeks    Status Achieved    Target Date 07/06/21      PT SHORT TERM GOAL #2   Title Patient will perform independent sit to stand without UE support and no increase in pain indicative of improved functional LE strength and ability to perform transferring necessary for accessing home and community environment    Baseline IE: Significant difficulty with sit to stand with heavy UE support required. 07/16/21: performed with moderate L hip pain c weightbearing    Time 4    Period Weeks    Status Partially Met    Target Date 07/30/21               PT Long Term Goals - 07/30/21 1010       PT LONG TERM GOAL #1   Title Patient will demonstrate improved function as evidenced by a score of 59 on  FOTO measure for full participation in activities at home and in the community.    Baseline 06/15/21: 50. 07/15/21: 49.    Time 8    Period Weeks    Status Not Met    Target Date 08/10/21      PT LONG TERM GOAL #2   Title Patient will perform consecutive hurdle step over 3 6-inch hurdles with good heel to toe progression, no LOB, no buckling off affected LE, and no increase in pain indicative of improved ability to weight shift to affected LE and clear lower limb over obstacles as needed for functional mobility    Baseline IE: Antalgic gait pattern wth decreased LLE weight shift. 07/15/21: able to perform dynamic march in // bars with sound weight shift. 07/28/21: performed in parallel bars with no LOB and no net increase in pain/sypmtoms    Time 8    Period Weeks    Status Achieved    Target Date 08/10/21      PT LONG TERM GOAL #3   Title Patient will improve MMT to 4+/5 for all tested LE musculature indicative of improved strength as needed for improved performance of transferring and as needed to produce power to prevent LOB during episode of large postural perturbation    Baseline IE: MMT 3+ to 4/5 for hip musculature (flexion, ER, IR, ABD), 4/5 for hamstrings and ankle dorsiflexors. 07/15/21: Not met for ER/IR/ABD. otherwise 4+ or greater.    Time 8    Period Weeks    Status Partially Met    Target Date 08/10/21      PT LONG TERM GOAL #4   Title Patient will ambulate x 300 feet in clinic with no AD with symmetrical weight shift and normal heel to toe progression without LOB and no reproduction of pain indicative of improved community-level gait    Baseline IE: Gait with antalgic pattern and decreased stance time LLE. 07/15/21: improving weight shift to LLE, though pt still demonstrates moderate antalgic pattern that is worse following prolonged sitting    Time 8    Period Weeks    Status Partially Met    Target Date 08/10/21  Plan - 08/04/21 1653      Clinical Impression Statement Patient did tolerate walking at [facility] fairly well this past Friday, but she did feel notable fatigue and receive wheelchair transport when leaving hospital late Friday (she did not feel that she was limited due to musculoskeletal pain). Patient exhibits improving antalgic pattern with decreased abductor lurch on LLE. She reports ongoing pain around 5/10, but she has significantly improved ability to accept weight onto LLE and standing activity tolerance. She feels she has made progress with PT, but still needs work on pain reduction, strength, and her gait. Patient will benefit from continued skilled PT intervention to address the above impairments and activity limitations for best return to PLOF.    Personal Factors and Comorbidities Age;Comorbidity 3+;Time since onset of injury/illness/exacerbation    Comorbidities CAD, acid reflux, s/p TIA    Examination-Activity Limitations Bed Mobility;Stairs;Stand;Dressing;Bathing;Bend;Locomotion Level;Squat    Examination-Participation Restrictions Community Activity;Cleaning;Laundry    Stability/Clinical Decision Making Evolving/Moderate complexity    Rehab Potential Good    PT Frequency 2x / week    PT Duration 8 weeks    PT Treatment/Interventions ADLs/Self Care Home Management    PT Next Visit Plan Manual therapy with coxafemoral mobilization techniques and STM for symptom modulation, hip ROM, unloaded isotonics with gradual transition to progressive weightbearing and CKC strengthening    PT Home Exercise Plan Access Code [phone].             Patient will benefit from skilled therapeutic intervention in order to improve the following deficits and impairments:  Abnormal gait, Hypomobility, Difficulty walking, Decreased strength, Pain, Decreased range of motion  Visit Diagnosis: Pain in left hip  Difficulty in walking, not elsewhere classified  Muscle weakness (generalized)  Stiffness of left hip, not  elsewhere classified     Problem List Patient Active Problem List   Diagnosis Date Noted   Unstable angina ([place]) 01/19/2017   CAD (coronary artery disease) 01/19/2017   Valentina Gu, PT, DPT #[id number]  Eilleen Kempf 08/04/2021, 4:56 PM   [facility] [address] [place], [place], [address] Phone: [phone]   Fax:  [phone]  Name: ANTHONELLA KLAUSNER MRN: [id number] Date of Birth: [birth year]

## 2021-08-06 ENCOUNTER — Other Ambulatory Visit: Payer: Self-pay

## 2021-08-06 ENCOUNTER — Ambulatory Visit: Payer: Medicare Other | Admitting: Physical Therapy

## 2021-08-06 DIAGNOSIS — M25552 Pain in left hip: Secondary | ICD-10-CM

## 2021-08-06 DIAGNOSIS — R262 Difficulty in walking, not elsewhere classified: Secondary | ICD-10-CM

## 2021-08-06 DIAGNOSIS — M6281 Muscle weakness (generalized): Secondary | ICD-10-CM

## 2021-08-06 DIAGNOSIS — M25652 Stiffness of left hip, not elsewhere classified: Secondary | ICD-10-CM

## 2021-08-06 NOTE — Therapy (Signed)
[facility] [address] [place], [place], [address] Phone: [phone]   Fax:  [phone]  Physical Therapy Treatment  Patient Details  Name: Kristen Lloyd MRN: [id number] Date of Birth: November 28, 1941 Referring Provider (PT): Allene Dillon, FNP   Encounter Date: 08/06/2021   PT End of Session - 08/07/21 1438     Visit Number 15    Number of Visits 17    Date for PT Re-Evaluation 08/11/21    Authorization Time Period Cert 3/00-9/23    Progress Note Due on Visit 19    PT Start Time 1602    PT Stop Time 1645    PT Time Calculation (min) 43 min    Activity Tolerance Patient tolerated treatment well;Patient limited by pain    Behavior During Therapy [facility] for tasks assessed/performed             Past Medical History:  Diagnosis Date   Acid reflux    Coronary artery disease    Diverticulitis    Stroke [facility])     Past Surgical History:  Procedure Laterality Date   ABDOMINAL HYSTERECTOMY     BREAST BIOPSY Left 1990's   lt bx x 2-neg   CARDIAC CATHETERIZATION N/A 01/19/2017   Procedure: Left Heart Cath and Coronary Angiography;  Surgeon: Teodoro Spray, MD;  Location: [place] [facility];  Service: Cardiovascular;  Laterality: N/A;   CARDIAC CATHETERIZATION N/A 01/19/2017   Procedure: Coronary Stent Intervention;  Surgeon: Isaias Cowman, MD;  Location: [place] [facility];  Service: Cardiovascular;  Laterality: N/A;   CARDIAC SURGERY     Ileostomy and reversal N/A 2021   TONSILLECTOMY      There were no vitals filed for this visit.   Subjective Assessment - 08/07/21 1436     Subjective Pt reports feeling more sore today along lateral hip and posterolateral gluteal region. She reports 5/10 pain at arrival to PT. She reports notable soreness was present yesterday as well. Pt is compliant with HEP.    Pertinent History Patient is an 80 year old female with primary complaint of L hip pain. Patient has referring  diagnosis of L hip OA. Patient reports about 2 years of hip pain. She reports she was doing senior fitness program at [facility] at the time her symptoms began. She states that the staff noticed a change in her gait pattern and she felt minmal pain. This has since worsened. Patient states she was very sick last year and didn't do as much exercise at the time. Patient had ileostomy and reversal in 2021 with significant GI symptoms - significant recovery time required and patient had notably decreased physical activity. Patient did have steroid injection 05/19/2021 that did provide moderate relief. She feels that later in the day she is getting pain into thoracolumbar region from the effect her condition has on her walking. She states that during the day it "isn't that bad." Patient reports no night pain. Pain is worse in AM. Hx of TIA in 2015. No numbness or tingling. Patient stated goals: to avoid surgery and to walk better.    Limitations Standing;Walking;House hold activities    How long can you stand comfortably? 5-10 minutes    How long can you walk comfortably? 30 minutes    Diagnostic tests L hip radiographs; evidence of OA/degenerative change    Patient Stated Goals To avoid surgery, able to walk better  Treatment Performed   NuStep x 5 minutes, L3; seat 6, arms 7 (unbilled time)     Manual Therapy - for joint and soft tissue mobility, symptom modulation, hip ROM   L hip PROM within pt tolerance  STM and IASTM with Hypervolt along bilat L3-L5 erector spinae (emphasis on L side) and L gluteal mm in R sidelying STM/IASTM with Theraband roller along rectus femoris and vastus lateralis, adductor longus and magnus    *not today* L hip gentle adductor stretch; 3x30sec L hip posterior mobilization (axial load through femur) with conjunct passive ER/IR within pt tolerance   L hip long-axis distraction in supine for axial decompression of LLE weightbearing joints L  hip lateral distraction with Mulligan belt, intermittent L hip MWM (c belt distraction) with hip flexion      Therapeutic exercise - for improved strength as needed for ability to perform transferring, weightbearing activity, obstacle negotiation; for soft tissue mobility and ROM   Lower trunk rotations; x10 alternating Sit to stand, raised table; 2x10, with 6-lb Med ball goblet hold   *next visit* Sidelying hip abduction; 2x8  *not today* Supine hip abduction AROM; x20 Seated knee extension; 2x10, bilateral; 7.5-lb cuff weight [employer], blue swiss ball; 3-way (forward,  L and R diagonal); x5 ea dir Butterfly stretch; 3x30sec, hooklying Open book; x10 ea side SLR; 2x10 Glute bridge; 2x10 Butterfly stretch; 3x30sec, hooklying         Neuromuscular Re-education - for movement control and trunk stabilization, targeting cueing for gluteal activation     3-way Hip, standing with bilat UE support; x10 ea dir, bilat, with 2-lb cuff weight   Tandem stance; 1x30 seconds, bilaterally  Tandem stance on foam; 1x30seconds, bilaterally   *next visit* Sidestep with Red Tband at distal shins; 3x down/back, 10-ft course    *not today* Hurdle stepping, 3 6-inch hurdles; 4x down/back length of // bars  Dynamic march; x5 D/B length of // bars Clamshell; 2x10, R sidelying [verbal cueing, tactile cueing, and demo for technique and to prevent trunk rotation compensation] Standing march; x10 alternating with unilateral table support  Pelvic tilt; 2x10, hooklying, anterior/posterior [minimal verbal and tactile cueing]       Multimodal cueing including tactile and verbal cueing as well as exercise demonstration utilized throughout session to optimize technique. Home exercise program discussed and reviewed throughout session.        ASSESSMENT Patient exhibits moderately decreased stance time on LLE relative to her last 2 visits. Regressed with hip strengthening drills today due  to significant soreness reported today. Pt has made notable progress in regard to strength and ability to perform transferring; however, she does still have limitation with long-distance gait and notable L hip stiffness. Pt has pain remaining around 5/10 over the last week. She feels she has made progress with PT, but still needs work on pain reduction, strength, and her gait. Patient will benefit from continued skilled PT intervention to address the above impairments and activity limitations for best return to PLOF.      PT Short Term Goals - 07/16/21 1339       PT SHORT TERM GOAL #1   Title Pt will be independent and 100% compliant with established HEP and activity modification as needed to augment PT intervention and improve pertinent strength and mobility deficits as needed for best return to prior level of function.    Baseline HEP provided at IE 06/15/21. 07/16/21: compliant and independent with exercise technique    Time 3  Period Weeks    Status Achieved    Target Date 07/06/21      PT SHORT TERM GOAL #2   Title Patient will perform independent sit to stand without UE support and no increase in pain indicative of improved functional LE strength and ability to perform transferring necessary for accessing home and community environment    Baseline IE: Significant difficulty with sit to stand with heavy UE support required. 07/16/21: performed with moderate L hip pain c weightbearing    Time 4    Period Weeks    Status Partially Met    Target Date 07/30/21               PT Long Term Goals - 07/30/21 1010       PT LONG TERM GOAL #1   Title Patient will demonstrate improved function as evidenced by a score of 59 on FOTO measure for full participation in activities at home and in the community.    Baseline 06/15/21: 50. 07/15/21: 49.    Time 8    Period Weeks    Status Not Met    Target Date 08/10/21      PT LONG TERM GOAL #2   Title Patient will perform consecutive hurdle  step over 3 6-inch hurdles with good heel to toe progression, no LOB, no buckling off affected LE, and no increase in pain indicative of improved ability to weight shift to affected LE and clear lower limb over obstacles as needed for functional mobility    Baseline IE: Antalgic gait pattern wth decreased LLE weight shift. 07/15/21: able to perform dynamic march in // bars with sound weight shift. 07/28/21: performed in parallel bars with no LOB and no net increase in pain/sypmtoms    Time 8    Period Weeks    Status Achieved    Target Date 08/10/21      PT LONG TERM GOAL #3   Title Patient will improve MMT to 4+/5 for all tested LE musculature indicative of improved strength as needed for improved performance of transferring and as needed to produce power to prevent LOB during episode of large postural perturbation    Baseline IE: MMT 3+ to 4/5 for hip musculature (flexion, ER, IR, ABD), 4/5 for hamstrings and ankle dorsiflexors. 07/15/21: Not met for ER/IR/ABD. otherwise 4+ or greater.    Time 8    Period Weeks    Status Partially Met    Target Date 08/10/21      PT LONG TERM GOAL #4   Title Patient will ambulate x 300 feet in clinic with no AD with symmetrical weight shift and normal heel to toe progression without LOB and no reproduction of pain indicative of improved community-level gait    Baseline IE: Gait with antalgic pattern and decreased stance time LLE. 07/15/21: improving weight shift to LLE, though pt still demonstrates moderate antalgic pattern that is worse following prolonged sitting    Time 8    Period Weeks    Status Partially Met    Target Date 08/10/21                   Plan - 08/07/21 1447     Clinical Impression Statement Patient exhibits moderately decreased stance time on LLE relative to her last 2 visits. Regressed with hip strengthening drills today due to significant soreness reported today. Pt has made notable progress in regard to strength and ability to  perform transferring; however, she does still have  limitation with long-distance gait and notable L hip stiffness. Pt has pain remaining around 5/10 over the last week. She feels she has made progress with PT, but still needs work on pain reduction, strength, and her gait. Patient will benefit from continued skilled PT intervention to address the above impairments and activity limitations for best return to PLOF.    Personal Factors and Comorbidities Age;Comorbidity 3+;Time since onset of injury/illness/exacerbation    Comorbidities CAD, acid reflux, s/p TIA    Examination-Activity Limitations Bed Mobility;Stairs;Stand;Dressing;Bathing;Bend;Locomotion Level;Squat    Examination-Participation Restrictions Community Activity;Cleaning;Laundry    Stability/Clinical Decision Making Evolving/Moderate complexity    Rehab Potential Good    PT Frequency 2x / week    PT Duration 8 weeks    PT Treatment/Interventions ADLs/Self Care Home Management    PT Next Visit Plan Manual therapy with coxafemoral mobilization techniques and STM for symptom modulation, hip ROM, unloaded isotonics with gradual transition to progressive weightbearing and CKC strengthening    PT Home Exercise Plan Access Code [phone].             Patient will benefit from skilled therapeutic intervention in order to improve the following deficits and impairments:  Abnormal gait, Hypomobility, Difficulty walking, Decreased strength, Pain, Decreased range of motion  Visit Diagnosis: Pain in left hip  Difficulty in walking, not elsewhere classified  Muscle weakness (generalized)  Stiffness of left hip, not elsewhere classified     Problem List Patient Active Problem List   Diagnosis Date Noted   Unstable angina ([place]) 01/19/2017   CAD (coronary artery disease) 01/19/2017   Valentina Gu, PT, DPT #[id number]  Eilleen Kempf 08/07/2021, 2:47 PM  [facility] [address] [place], [place], [address] Phone: [phone]   Fax:  [phone]  Name: Kristen Lloyd MRN: [id number] Date of Birth: 1941/04/18

## 2021-08-07 ENCOUNTER — Encounter: Payer: Self-pay | Admitting: Physical Therapy

## 2021-08-11 ENCOUNTER — Other Ambulatory Visit: Payer: Self-pay

## 2021-08-11 ENCOUNTER — Ambulatory Visit: Payer: Medicare Other | Admitting: Physical Therapy

## 2021-08-11 DIAGNOSIS — M25652 Stiffness of left hip, not elsewhere classified: Secondary | ICD-10-CM

## 2021-08-11 DIAGNOSIS — M6281 Muscle weakness (generalized): Secondary | ICD-10-CM

## 2021-08-11 DIAGNOSIS — M25552 Pain in left hip: Secondary | ICD-10-CM | POA: Diagnosis not present

## 2021-08-11 DIAGNOSIS — R262 Difficulty in walking, not elsewhere classified: Secondary | ICD-10-CM

## 2021-08-11 NOTE — Therapy (Addendum)
[facility] [address] [place], [place], [address] Phone: [phone]   Fax:  [phone]  Physical Therapy Treatment Physical Therapy Progress Note   Dates of reporting period  07/15/21   to   08/11/21   Patient Details  Name: Kristen Lloyd MRN: [id number] Date of Birth: 1941-11-24 Referring Provider (PT): Allene Dillon, FNP   Encounter Date: 08/11/2021   PT End of Session - 08/13/21 0741     Visit Number 16    Number of Visits 21    Date for PT Re-Evaluation 08/11/21    Authorization Time Period Cert 2/03-5/59    Progress Note Due on Visit 19    PT Start Time 1606    PT Stop Time 7416    PT Time Calculation (min) 41 min    Activity Tolerance Patient tolerated treatment well;Patient limited by pain    Behavior During Therapy [facility] for tasks assessed/performed              Past Medical History:  Diagnosis Date   Acid reflux    Coronary artery disease    Diverticulitis    Stroke [facility])     Past Surgical History:  Procedure Laterality Date   ABDOMINAL HYSTERECTOMY     BREAST BIOPSY Left 1990's   lt bx x 2-neg   CARDIAC CATHETERIZATION N/A 01/19/2017   Procedure: Left Heart Cath and Coronary Angiography;  Surgeon: Teodoro Spray, MD;  Location: [place] [facility];  Service: Cardiovascular;  Laterality: N/A;   CARDIAC CATHETERIZATION N/A 01/19/2017   Procedure: Coronary Stent Intervention;  Surgeon: Isaias Cowman, MD;  Location: [place] [facility];  Service: Cardiovascular;  Laterality: N/A;   CARDIAC SURGERY     Ileostomy and reversal N/A 2021   TONSILLECTOMY      There were no vitals filed for this visit.   Subjective Assessment - 08/13/21 0740     Subjective Patient reports pain mainly with weightbearing and with walking after initially getting up from sitting. Patient reports ongoing gluteal soreness through this past weekend - she reports taking Motrin due to significant gluteal soreness.  Patient reports not having pain with lying and sitting. Patient reports 50% global rating of function at this time. Patient reports ongoing limitation c standing at 5-10 minutes. She reports some gait changes may be due to prolonged sitting this afternoon. Pt reports her main concern is ongoing groin pain.    Pertinent History Patient is an 80 year old female with primary complaint of L hip pain. Patient has referring diagnosis of L hip OA. Patient reports about 2 years of hip pain. She reports she was doing senior fitness program at [facility] at the time her symptoms began. She states that the staff noticed a change in her gait pattern and she felt minmal pain. This has since worsened. Patient states she was very sick last year and didn't do as much exercise at the time. Patient had ileostomy and reversal in 2021 with significant GI symptoms - significant recovery time required and patient had notably decreased physical activity. Patient did have steroid injection 05/19/2021 that did provide moderate relief. She feels that later in the day she is getting pain into thoracolumbar region from the effect her condition has on her walking. She states that during the day it "isn't that bad." Patient reports no night pain. Pain is worse in AM. Hx of TIA in 2015. No numbness or tingling. Patient stated goals: to avoid surgery and to  walk better.    Limitations Standing;Walking;House hold activities    How long can you stand comfortably? 5-10 minutes    How long can you walk comfortably? 30 minutes    Diagnostic tests L hip radiographs; evidence of OA/degenerative change    Patient Stated Goals To avoid surgery, able to walk better    Currently in Pain? Yes    Pain Score 5                   OBJECTIVE FINDINGS     Posture Decreased lumbar lordosis, forward trunk lean during static standing   Gait Antalgic pattern with decreased stance time LLE, good stride and gait velocity    Palpation Tenderness to palpation along L distal iliopsoas, L proximal rectus femoris, L greater trochanter, L piriformis/gemelli complex, L L3-S1 longissimus lumborum   Strength (out of 5) R/L 4+/4+ Hip flexion 4+/4-* Hip ER 4+/4 Hip IR Not tested/3+ Hip abduction 5/5 Hip adduction 5/4+ Knee extension 5/4+ Knee flexion 5/5 Ankle dorsiflexion *Indicates pain     AROM (degrees) R/L (all movements include overpressure unless otherwise stated) Lumbar forward flexion (65): 75% * Lumbar extension (30): <25* Lumbar lateral flexion (25): R: 25%* L: 25%* Thoracic and Lumbar rotation (30 degrees):  R: 50%* L: 25%* Hip ER (0-45): R: 45, L: 15 Hip Flexion (0-125): R: 110, L 100* Hip Abduction (0-40): R: 40 L: 17 *Indicates pain     PROM (degrees) PROM = AROM R/L Hip IR (0-45): R: 15 L: 17 Hip ER (0-45): R: 40, L: 45 Hip Flexion (0-125): R: 110, L110* Hip Abduction (0-40): R: 40 L: 20 Hip extension (0-15): R: not tested L: to neutral * *Indicates pain           Treatment Performed   NuStep x 5 minutes, L3; seat 6, arms 7 - subjective exam for re-assessment gathered during this time    Therapeutic Activities Re-assessment performed (see above)    Manual Therapy - for joint and soft tissue mobility, symptom modulation, hip ROM   L hip PROM within pt tolerance, including L hip extension PROM in R sidelying  STM and IASTM with Hypervolt along bilat L3-L5 erector spinae (emphasis on L side) and L gluteal mm in R sidelying      *not today* STM/IASTM with Theraband roller along rectus femoris and vastus lateralis, adductor longus and magnus L hip gentle adductor stretch; 3x30sec L hip posterior mobilization (axial load through femur) with conjunct passive ER/IR within pt tolerance   L hip long-axis distraction in supine for axial decompression of LLE weightbearing joints L hip lateral distraction with Mulligan belt, intermittent L hip MWM (c belt distraction) with hip  flexion      Therapeutic exercise - for improved strength as needed for ability to perform transferring, weightbearing activity, obstacle negotiation; for soft tissue mobility and ROM     *next visit* Sidelying hip abduction; 2x8 Lower trunk rotations; x10 alternating Sit to stand, raised table; 2x10, with 6-lb Med ball goblet hold   *not today* Supine hip abduction AROM; x20 Seated knee extension; 2x10, bilateral; 7.5-lb cuff weight [employer], blue swiss ball; 3-way (forward,  L and R diagonal); x5 ea dir Butterfly stretch; 3x30sec, hooklying Open book; x10 ea side SLR; 2x10 Glute bridge; 2x10 Butterfly stretch; 3x30sec, hooklying         Neuromuscular Re-education - for movement control and trunk stabilization, targeting cueing for gluteal activation   *next visit* Sidestep with Red Tband at distal  shins; 3x down/back, 10-ft course   3-way Hip, standing with bilat UE support; x10 ea dir, bilat, with 2-lb cuff weight   Tandem stance; 1x30 seconds, bilaterally   Tandem stance on foam; 1x30seconds, bilaterally   *not today* Hurdle stepping, 3 6-inch hurdles; 4x down/back length of // bars   Dynamic march; x5 D/B length of // bars Clamshell; 2x10, R sidelying [verbal cueing, tactile cueing, and demo for technique and to prevent trunk rotation compensation] Standing march; x10 alternating with unilateral table support  Pelvic tilt; 2x10, hooklying, anterior/posterior [minimal verbal and tactile cueing]       Multimodal cueing including tactile and verbal cueing as well as exercise demonstration utilized throughout session to optimize technique. Home exercise program discussed and reviewed throughout session.        ASSESSMENT Patient has L lower limb pain remaining around 5/10 over the previous 2 weeks. She exhibits relative plateau in hip ROM and LE strength progression. She has demonstrated at several treatments her improved ability to perform sit to stand.  She reports fair global rating of improvement at this time. She unfortunately is still notably limited with tolerance of prolonged weightbearing on L lower limb. Pain in thoracolumbar spine that was exacerbated by bed mobility tasks (supine <> sit, rolling) has notably improved. Numeric pain rating scale has been relatively stable, though moderate pain is still reported as described above. She feels she has made progress with PT, but still needs work on pain reduction, strength, and her gait. Given relatively slow progress since her last re-evaluation and ongoing pain/activity limitation with gait and weightbearing, discussed f/u with referring provider to discuss medical management. Pt is to follow-up with referring provider after completing current POC at end of August. Patient will benefit from continued skilled PT intervention to address the above impairments and activity limitations for best return to PLOF.          PT Short Term Goals - 08/11/21 1647       PT SHORT TERM GOAL #1   Title Pt will be independent and 100% compliant with established HEP and activity modification as needed to augment PT intervention and improve pertinent strength and mobility deficits as needed for best return to prior level of function.    Baseline HEP provided at IE 06/15/21. 07/16/21: compliant and independent with exercise technique    Time 3    Period Weeks    Status Achieved    Target Date 07/06/21      PT SHORT TERM GOAL #2   Title Patient will perform independent sit to stand without UE support and no increase in pain indicative of improved functional LE strength and ability to perform transferring necessary for accessing home and community environment    Baseline IE: Significant difficulty with sit to stand with heavy UE support required. 07/16/21: performed with moderate L hip pain c weightbearing. 08/11/21: performed without increase in pain    Time 4    Period Weeks    Status Achieved    Target Date  07/30/21               PT Long Term Goals - 08/12/21 2009       PT LONG TERM GOAL #1   Title Patient will demonstrate improved function as evidenced by a score of 59 on FOTO measure for full participation in activities at home and in the community.    Baseline 06/15/21: 50. 07/15/21: 49. 08/12/21: 49    Time 8  Period Weeks    Status Not Met    Target Date 08/27/21      PT LONG TERM GOAL #2   Title Patient will perform consecutive hurdle step over 3 6-inch hurdles with good heel to toe progression, no LOB, no buckling off affected LE, and no increase in pain indicative of improved ability to weight shift to affected LE and clear lower limb over obstacles as needed for functional mobility    Baseline IE: Antalgic gait pattern wth decreased LLE weight shift. 07/15/21: able to perform dynamic march in // bars with sound weight shift. 07/28/21: performed in parallel bars with no LOB and no net increase in pain/sypmtoms    Time 8    Period Weeks    Status Achieved    Target Date 08/10/21      PT LONG TERM GOAL #3   Title Patient will improve MMT to 4+/5 for all tested LE musculature indicative of improved strength as needed for improved performance of transferring and as needed to produce power to prevent LOB during episode of large postural perturbation    Baseline IE: MMT 3+ to 4/5 for hip musculature (flexion, ER, IR, ABD), 4/5 for hamstrings and ankle dorsiflexors. 07/15/21: Not met for ER/IR/ABD. otherwise 4+ or greater. 08/11/21: Not met for hip ER/IR/ABD    Time 8    Period Weeks    Status Partially Met    Target Date 08/27/21      PT LONG TERM GOAL #4   Title Patient will ambulate x 300 feet in clinic with no AD with symmetrical weight shift and normal heel to toe progression without LOB and no reproduction of pain indicative of improved community-level gait    Baseline IE: Gait with antalgic pattern and decreased stance time LLE. 07/15/21: improving weight shift to LLE, though pt  still demonstrates moderate antalgic pattern that is worse following prolonged sitting. 08/11/21: Intermittent improved weight shift, though antalgic pattern is exacerbated after period of sitting/immobility    Time 8    Period Weeks    Status Partially Met    Target Date 08/27/21                   Plan - 08/13/21 0753     Clinical Impression Statement Patient has L lower limb pain remaining around 5/10 over the previous 2 weeks. She exhibits relative plateau in hip ROM and LE strength progression. She has demonstrated at several treatments her improved ability to perform sit to stand. She reports fair global rating of improvement at this time. She unfortunately is still notably limited with tolerance of prolonged weightbearing on L lower limb. Pain in thoracolumbar spine that was exacerbated by bed mobility tasks (supine <> sit, rolling) has notably improved. Numeric pain rating scale has been relatively stable, though moderate pain is still reported as described above. She feels she has made progress with PT, but still needs work on pain reduction, strength, and her gait. Given relatively slow progress since her last re-evaluation and ongoing pain/activity limitation with gait and weightbearing, discussed f/u with referring provider to discuss medical management. Pt is to follow-up with referring provider after completing current POC at end of August. Patient will benefit from continued skilled PT intervention to address the above impairments and activity limitations for best return to PLOF.    Personal Factors and Comorbidities Age;Comorbidity 3+;Time since onset of injury/illness/exacerbation    Comorbidities CAD, acid reflux, s/p TIA    Examination-Activity Limitations Bed Mobility;Stairs;Stand;Dressing;Bathing;Bend;Locomotion Level;Squat  Examination-Participation Restrictions Community Activity;Cleaning;Laundry    Stability/Clinical Decision Making Evolving/Moderate complexity     Rehab Potential Good    PT Frequency 2x / week    PT Duration 8 weeks    PT Treatment/Interventions ADLs/Self Care Home Management    PT Next Visit Plan Manual therapy with coxafemoral mobilization techniques and STM for symptom modulation, hip ROM, unloaded isotonics with gradual transition to progressive weightbearing and CKC strengthening    PT Home Exercise Plan Access Code [phone].             Patient will benefit from skilled therapeutic intervention in order to improve the following deficits and impairments:  Abnormal gait, Hypomobility, Difficulty walking, Decreased strength, Pain, Decreased range of motion  Visit Diagnosis: Pain in left hip  Difficulty in walking, not elsewhere classified  Muscle weakness (generalized)  Stiffness of left hip, not elsewhere classified     Problem List Patient Active Problem List   Diagnosis Date Noted   Unstable angina ([place]) 01/19/2017   CAD (coronary artery disease) 01/19/2017   Valentina Gu, PT, DPT #[id number]  Eilleen Kempf 08/13/2021, 7:53 AM  [facility] [address] [place], [place], [address] Phone: [phone]   Fax:  [phone]  Name: Kristen Lloyd MRN: [id number] Date of Birth: 08-06-1941

## 2021-08-12 ENCOUNTER — Encounter: Payer: Self-pay | Admitting: Physical Therapy

## 2021-08-13 ENCOUNTER — Ambulatory Visit: Payer: Medicare Other | Admitting: Physical Therapy

## 2021-08-13 ENCOUNTER — Other Ambulatory Visit: Payer: Self-pay

## 2021-08-13 ENCOUNTER — Encounter: Payer: Self-pay | Admitting: Physical Therapy

## 2021-08-13 DIAGNOSIS — M25552 Pain in left hip: Secondary | ICD-10-CM

## 2021-08-13 DIAGNOSIS — M25652 Stiffness of left hip, not elsewhere classified: Secondary | ICD-10-CM

## 2021-08-13 DIAGNOSIS — R262 Difficulty in walking, not elsewhere classified: Secondary | ICD-10-CM

## 2021-08-13 DIAGNOSIS — M6281 Muscle weakness (generalized): Secondary | ICD-10-CM

## 2021-08-13 NOTE — Therapy (Signed)
[facility] [address] [place], [place], [address] Phone: [phone]   Fax:  [phone]  Physical Therapy Treatment  Patient Details  Name: Kristen Lloyd MRN: [id number] Date of Birth: 1941-02-13 Referring Provider (PT): Allene Dillon, FNP   Encounter Date: 08/13/2021   PT End of Session - 08/15/21 1010     Visit Number 17    Number of Visits 21    Date for PT Re-Evaluation 08/11/21    Authorization Time Period Cert 4/70-9/62    Progress Note Due on Visit 43    PT Start Time 1602    PT Stop Time 1646    PT Time Calculation (min) 44 min    Activity Tolerance Patient tolerated treatment well;Patient limited by pain    Behavior During Therapy [facility] for tasks assessed/performed             Past Medical History:  Diagnosis Date   Acid reflux    Coronary artery disease    Diverticulitis    Stroke [facility])     Past Surgical History:  Procedure Laterality Date   ABDOMINAL HYSTERECTOMY     BREAST BIOPSY Left 1990's   lt bx x 2-neg   CARDIAC CATHETERIZATION N/A 01/19/2017   Procedure: Left Heart Cath and Coronary Angiography;  Surgeon: Teodoro Spray, MD;  Location: [place] [facility];  Service: Cardiovascular;  Laterality: N/A;   CARDIAC CATHETERIZATION N/A 01/19/2017   Procedure: Coronary Stent Intervention;  Surgeon: Isaias Cowman, MD;  Location: [place] [facility];  Service: Cardiovascular;  Laterality: N/A;   CARDIAC SURGERY     Ileostomy and reversal N/A 2021   TONSILLECTOMY      There were no vitals filed for this visit.   Subjective Assessment - 08/15/21 1009     Subjective Patient reports no major changes at arrival to PT. She reports moderate pain affecting L groin and gluteal region at 4-5/10 at arrival to PT. She reports tolerating her last session fairly well, although she has had continuous soreness over the week.    Pertinent History Patient is an 80 year old female with primary complaint of L  hip pain. Patient has referring diagnosis of L hip OA. Patient reports about 2 years of hip pain. She reports she was doing senior fitness program at [facility] at the time her symptoms began. She states that the staff noticed a change in her gait pattern and she felt minmal pain. This has since worsened. Patient states she was very sick last year and didn't do as much exercise at the time. Patient had ileostomy and reversal in 2021 with significant GI symptoms - significant recovery time required and patient had notably decreased physical activity. Patient did have steroid injection 05/19/2021 that did provide moderate relief. She feels that later in the day she is getting pain into thoracolumbar region from the effect her condition has on her walking. She states that during the day it "isn't that bad." Patient reports no night pain. Pain is worse in AM. Hx of TIA in 2015. No numbness or tingling. Patient stated goals: to avoid surgery and to walk better.    Limitations Standing;Walking;House hold activities    How long can you stand comfortably? 5-10 minutes    How long can you walk comfortably? 30 minutes    Diagnostic tests L hip radiographs; evidence of OA/degenerative change    Patient Stated Goals To avoid surgery, able to walk better  Treatment Performed   NuStep x 5 minutes, L3; seat 6, arms 7 - unbilled time     Manual Therapy - for joint and soft tissue mobility, symptom modulation, hip ROM   L hip PROM within pt tolerance, including L hip extension PROM in R sidelying STM along adductor magnus  STM and IASTM with Hypervolt along bilat L3-L5 erector spinae (emphasis on L side) and L gluteal mm in R sidelying       *not today* STM/IASTM with Theraband roller along rectus femoris and vastus lateralis, adductor longus and magnus L hip gentle adductor stretch; 3x30sec L hip posterior mobilization (axial load through femur) with conjunct passive ER/IR within pt  tolerance   L hip long-axis distraction in supine for axial decompression of LLE weightbearing joints L hip lateral distraction with Mulligan belt, intermittent L hip MWM (c belt distraction) with hip flexion      Therapeutic exercise - for improved strength as needed for ability to perform transferring, weightbearing activity, obstacle negotiation; for soft tissue mobility and ROM    Sidelying hip abduction; 1x10 Lower trunk rotations; x10 alternating Sit to stand, raised table; 2x10, with 6-lb Med ball goblet hold   *not today* Supine hip abduction AROM; x20 Seated knee extension; 2x10, bilateral; 7.5-lb cuff weight [employer], blue swiss ball; 3-way (forward,  L and R diagonal); x5 ea dir Butterfly stretch; 3x30sec, hooklying Open book; x10 ea side SLR; 2x10 Glute bridge; 2x10 Butterfly stretch; 3x30sec, hooklying         Neuromuscular Re-education - for movement control and trunk stabilization, targeting cueing for gluteal activation   Sidestep with Red Tband at distal shins; 3x down/back, 10-ft course    3-way Hip, standing with bilat UE support; x8 ea dir, bilat, with 3-lb cuff weight      *not today* Hurdle stepping, 3 6-inch hurdles; 4x down/back length of // bars Tandem stance; 1x30 seconds, bilaterally  Tandem stance on foam; 1x30seconds, bilaterally Dynamic march; x5 D/B length of // bars Clamshell; 2x10, R sidelying [verbal cueing, tactile cueing, and demo for technique and to prevent trunk rotation compensation] Standing march; x10 alternating with unilateral table support  Pelvic tilt; 2x10, hooklying, anterior/posterior [minimal verbal and tactile cueing]       Multimodal cueing including tactile and verbal cueing as well as exercise demonstration utilized throughout session to optimize technique. Home exercise program discussed and reviewed throughout session.        ASSESSMENT Patient has ongoing moderate pain in L hip, although she does  exhibit improved gait and reports feeling better following PT intervention. Unfortunately, she has consistent return of pain following intervention and is still limited with prolonged walking. She tolerates gluteal strengthening and CKC drills well today with no increase in symptoms. Pt has had plateau in ROM progress. Pt has remaining deficits in hip and LE strength, hip ROM, gait deviations, and pain. Pt is to follow-up with referring provider after completing current POC at end of August. Patient will benefit from continued skilled PT intervention to address the above impairments and activity limitations for best return to PLOF.        PT Short Term Goals - 08/11/21 1647       PT SHORT TERM GOAL #1   Title Pt will be independent and 100% compliant with established HEP and activity modification as needed to augment PT intervention and improve pertinent strength and mobility deficits as needed for best return to prior level of function.    Baseline  HEP provided at IE 06/15/21. 07/16/21: compliant and independent with exercise technique    Time 3    Period Weeks    Status Achieved    Target Date 07/06/21      PT SHORT TERM GOAL #2   Title Patient will perform independent sit to stand without UE support and no increase in pain indicative of improved functional LE strength and ability to perform transferring necessary for accessing home and community environment    Baseline IE: Significant difficulty with sit to stand with heavy UE support required. 07/16/21: performed with moderate L hip pain c weightbearing. 08/11/21: performed without increase in pain    Time 4    Period Weeks    Status Achieved    Target Date 07/30/21               PT Long Term Goals - 08/12/21 2009       PT LONG TERM GOAL #1   Title Patient will demonstrate improved function as evidenced by a score of 59 on FOTO measure for full participation in activities at home and in the community.    Baseline 06/15/21: 50.  07/15/21: 49. 08/12/21: 49    Time 8    Period Weeks    Status Not Met    Target Date 08/27/21      PT LONG TERM GOAL #2   Title Patient will perform consecutive hurdle step over 3 6-inch hurdles with good heel to toe progression, no LOB, no buckling off affected LE, and no increase in pain indicative of improved ability to weight shift to affected LE and clear lower limb over obstacles as needed for functional mobility    Baseline IE: Antalgic gait pattern wth decreased LLE weight shift. 07/15/21: able to perform dynamic march in // bars with sound weight shift. 07/28/21: performed in parallel bars with no LOB and no net increase in pain/sypmtoms    Time 8    Period Weeks    Status Achieved    Target Date 08/10/21      PT LONG TERM GOAL #3   Title Patient will improve MMT to 4+/5 for all tested LE musculature indicative of improved strength as needed for improved performance of transferring and as needed to produce power to prevent LOB during episode of large postural perturbation    Baseline IE: MMT 3+ to 4/5 for hip musculature (flexion, ER, IR, ABD), 4/5 for hamstrings and ankle dorsiflexors. 07/15/21: Not met for ER/IR/ABD. otherwise 4+ or greater. 08/11/21: Not met for hip ER/IR/ABD    Time 8    Period Weeks    Status Partially Met    Target Date 08/27/21      PT LONG TERM GOAL #4   Title Patient will ambulate x 300 feet in clinic with no AD with symmetrical weight shift and normal heel to toe progression without LOB and no reproduction of pain indicative of improved community-level gait    Baseline IE: Gait with antalgic pattern and decreased stance time LLE. 07/15/21: improving weight shift to LLE, though pt still demonstrates moderate antalgic pattern that is worse following prolonged sitting. 08/11/21: Intermittent improved weight shift, though antalgic pattern is exacerbated after period of sitting/immobility    Time 8    Period Weeks    Status Partially Met    Target Date 08/27/21                    Plan - 08/15/21 1038     Clinical  Impression Statement Patient has ongoing moderate pain in L hip, although she does exhibit improved gait and reports feeling better following PT intervention. Unfortunately, she has consistent return of pain following intervention and is still limited with prolonged walking. She tolerates gluteal strengthening and CKC drills well today with no increase in symptoms. Pt has had plateau in ROM progress. Pt has remaining deficits in hip and LE strength, hip ROM, gait deviations, and pain. Pt is to follow-up with referring provider after completing current POC at end of August. Patient will benefit from continued skilled PT intervention to address the above impairments and activity limitations for best return to PLOF.    Personal Factors and Comorbidities Age;Comorbidity 3+;Time since onset of injury/illness/exacerbation    Comorbidities CAD, acid reflux, s/p TIA    Examination-Activity Limitations Bed Mobility;Stairs;Stand;Dressing;Bathing;Bend;Locomotion Level;Squat    Examination-Participation Restrictions Community Activity;Cleaning;Laundry    Stability/Clinical Decision Making Evolving/Moderate complexity    Rehab Potential Good    PT Frequency 2x / week    PT Duration 8 weeks    PT Treatment/Interventions ADLs/Self Care Home Management    PT Next Visit Plan Manual therapy with coxafemoral mobilization techniques and STM for symptom modulation, hip ROM, unloaded isotonics with gradual transition to progressive weightbearing and CKC strengthening    PT Home Exercise Plan Access Code [phone].             Patient will benefit from skilled therapeutic intervention in order to improve the following deficits and impairments:  Abnormal gait, Hypomobility, Difficulty walking, Decreased strength, Pain, Decreased range of motion  Visit Diagnosis: Pain in left hip  Difficulty in walking, not elsewhere classified  Muscle weakness  (generalized)  Stiffness of left hip, not elsewhere classified     Problem List Patient Active Problem List   Diagnosis Date Noted   Unstable angina ([place]) 01/19/2017   CAD (coronary artery disease) 01/19/2017   Valentina Gu, PT, DPT #[id number]   Eilleen Kempf 08/15/2021, 10:38 AM  [facility] [address] [place], [place], [address] Phone: [phone]   Fax:  [phone]  Name: KRISHIKA BUGGE MRN: [id number] Date of Birth: 1941/05/27

## 2021-08-18 ENCOUNTER — Encounter: Payer: Medicare Other | Admitting: Physical Therapy

## 2021-08-20 ENCOUNTER — Ambulatory Visit: Payer: Medicare Other | Admitting: Physical Therapy

## 2021-08-20 ENCOUNTER — Other Ambulatory Visit: Payer: Self-pay

## 2021-08-20 DIAGNOSIS — M25652 Stiffness of left hip, not elsewhere classified: Secondary | ICD-10-CM

## 2021-08-20 DIAGNOSIS — R262 Difficulty in walking, not elsewhere classified: Secondary | ICD-10-CM

## 2021-08-20 DIAGNOSIS — M6281 Muscle weakness (generalized): Secondary | ICD-10-CM

## 2021-08-20 DIAGNOSIS — M25552 Pain in left hip: Secondary | ICD-10-CM | POA: Diagnosis not present

## 2021-08-20 NOTE — Therapy (Signed)
[facility] [address] [place], [place], [address] Phone: [phone]   Fax:  [phone]  Physical Therapy Treatment  Patient Details  Name: Kristen Lloyd MRN: [id number] Date of Birth: 1941/06/05 Referring Provider (PT): Allene Dillon, FNP   Encounter Date: 08/20/2021   PT End of Session - 08/22/21 1044     Visit Number 18    Number of Visits 21    Date for PT Re-Evaluation 08/11/21    Authorization Time Period Cert 0/94-0/76    Progress Note Due on Visit 19    PT Start Time 1601    PT Stop Time 1645    PT Time Calculation (min) 44 min    Activity Tolerance Patient tolerated treatment well;Patient limited by pain    Behavior During Therapy [facility] for tasks assessed/performed             Past Medical History:  Diagnosis Date   Acid reflux    Coronary artery disease    Diverticulitis    Stroke [facility])     Past Surgical History:  Procedure Laterality Date   ABDOMINAL HYSTERECTOMY     BREAST BIOPSY Left 1990's   lt bx x 2-neg   CARDIAC CATHETERIZATION N/A 01/19/2017   Procedure: Left Heart Cath and Coronary Angiography;  Surgeon: Teodoro Spray, MD;  Location: [facility];  Service: Cardiovascular;  Laterality: N/A;   CARDIAC CATHETERIZATION N/A 01/19/2017   Procedure: Coronary Stent Intervention;  Surgeon: Isaias Cowman, MD;  Location: [facility];  Service: Cardiovascular;  Laterality: N/A;   CARDIAC SURGERY     Ileostomy and reversal N/A 2021   TONSILLECTOMY      There were no vitals filed for this visit.   Subjective Assessment - 08/22/21 1045     Subjective Pt reports notable pain over the weekend. She states pain was up to 7/10 during the weekend with pain affecting L iliolumbar region, L gluteal region, and groin. She reports having notable L knee pain recently. She is unsure of specific aggravating factor. She reports feeling more symptoms Friday with them getting better at the  start of the week. She reports 5/10 pain at arrival to PT today.    Pertinent History Patient is an 80 year old female with primary complaint of L hip pain. Patient has referring diagnosis of L hip OA. Patient reports about 2 years of hip pain. She reports she was doing senior fitness program at [facility] at the time her symptoms began. She states that the staff noticed a change in her gait pattern and she felt minmal pain. This has since worsened. Patient states she was very sick last year and didn't do as much exercise at the time. Patient had ileostomy and reversal in 2021 with significant GI symptoms - significant recovery time required and patient had notably decreased physical activity. Patient did have steroid injection 05/19/2021 that did provide moderate relief. She feels that later in the day she is getting pain into thoracolumbar region from the effect her condition has on her walking. She states that during the day it "isn't that bad." Patient reports no night pain. Pain is worse in AM. Hx of TIA in 2015. No numbness or tingling. Patient stated goals: to avoid surgery and to walk better.    Limitations Standing;Walking;House hold activities    How long can you stand comfortably? 5-10 minutes    How long can you walk comfortably? 30 minutes    Diagnostic tests  L hip radiographs; evidence of OA/degenerative change    Patient Stated Goals To avoid surgery, able to walk better               Treatment Performed   NuStep x 5 minutes, L3; seat 6, arms 7 - unbilled time     Manual Therapy - for joint and soft tissue mobility, symptom modulation, hip ROM   L hip PROM within pt tolerance, including L hip extension PROM in R sidelying  STM and IASTM with Hypervolt along bilat L3-L5 erector spinae (emphasis on L side) and L gluteal mm in R sidelying       *not today* STM along adductor magnus STM/IASTM with Theraband roller along rectus femoris and vastus lateralis, adductor  longus and magnus L hip gentle adductor stretch; 3x30sec L hip posterior mobilization (axial load through femur) with conjunct passive ER/IR within pt tolerance   L hip long-axis distraction in supine for axial decompression of LLE weightbearing joints L hip lateral distraction with Mulligan belt, intermittent L hip MWM (c belt distraction) with hip flexion      Therapeutic exercise - for improved strength as needed for ability to perform transferring, weightbearing activity, obstacle negotiation; for soft tissue mobility and ROM     Sidelying clamshell; 2x10, Green Tband Lower trunk rotations; x10 alternating Sit to stand, raised table; 2x10, with 6-lb Med ball goblet hold   *not today* Sidelying hip abduction; 1x10 Supine hip abduction AROM; x20 Seated knee extension; 2x10, bilateral; 7.5-lb cuff weight [employer], blue swiss ball; 3-way (forward,  L and R diagonal); x5 ea dir Butterfly stretch; 3x30sec, hooklying Open book; x10 ea side SLR; 2x10 Glute bridge; 2x10 Butterfly stretch; 3x30sec, hooklying         Neuromuscular Re-education - for movement control and trunk stabilization, targeting cueing for gluteal activation   Sidestep with Red Tband at distal shins; 3x down/back, 10-ft course    3-way Hip, standing with bilat UE support; x8 ea dir, bilat, with 3-lb cuff weight       *not today* Hurdle stepping, 3 6-inch hurdles; 4x down/back length of // bars Tandem stance; 1x30 seconds, bilaterally  Tandem stance on foam; 1x30seconds, bilaterally Dynamic march; x5 D/B length of // bars Clamshell; 2x10, R sidelying [verbal cueing, tactile cueing, and demo for technique and to prevent trunk rotation compensation] Standing march; x10 alternating with unilateral table support  Pelvic tilt; 2x10, hooklying, anterior/posterior [minimal verbal and tactile cueing]       Multimodal cueing including tactile and verbal cueing as well as exercise demonstration utilized  throughout session to optimize technique. Home exercise program discussed and reviewed throughout session.        ASSESSMENT Patient has experienced difficulty with attempted progression of hip strengthening over the last 2 weeks and has ongoing gait deficits that are worse following period of sitting. She has maintained pain at moderate level 4-5, but she had notable pain over the weekend without significant increase in activity outside of PT or notable aggravating factor - pt believes that hip abductor isotonics could be a factor, though she did have a delayed onset of pain with increase in pain beginning the day after physical therapy. Pt has had plateau in ROM progress. Pt has remaining deficits in hip and LE strength, hip ROM, gait deviations, and pain. Pt is to follow-up with referring provider after completing current POC at end of August. Patient will benefit from continued skilled PT intervention to address the above impairments and  activity limitations for best return to PLOF.            PT Short Term Goals - 08/11/21 1647       PT SHORT TERM GOAL #1   Title Pt will be independent and 100% compliant with established HEP and activity modification as needed to augment PT intervention and improve pertinent strength and mobility deficits as needed for best return to prior level of function.    Baseline HEP provided at IE 06/15/21. 07/16/21: compliant and independent with exercise technique    Time 3    Period Weeks    Status Achieved    Target Date 07/06/21      PT SHORT TERM GOAL #2   Title Patient will perform independent sit to stand without UE support and no increase in pain indicative of improved functional LE strength and ability to perform transferring necessary for accessing home and community environment    Baseline IE: Significant difficulty with sit to stand with heavy UE support required. 07/16/21: performed with moderate L hip pain c weightbearing. 08/11/21: performed without  increase in pain    Time 4    Period Weeks    Status Achieved    Target Date 07/30/21               PT Long Term Goals - 08/12/21 2009       PT LONG TERM GOAL #1   Title Patient will demonstrate improved function as evidenced by a score of 59 on FOTO measure for full participation in activities at home and in the community.    Baseline 06/15/21: 50. 07/15/21: 49. 08/12/21: 49    Time 8    Period Weeks    Status Not Met    Target Date 08/27/21      PT LONG TERM GOAL #2   Title Patient will perform consecutive hurdle step over 3 6-inch hurdles with good heel to toe progression, no LOB, no buckling off affected LE, and no increase in pain indicative of improved ability to weight shift to affected LE and clear lower limb over obstacles as needed for functional mobility    Baseline IE: Antalgic gait pattern wth decreased LLE weight shift. 07/15/21: able to perform dynamic march in // bars with sound weight shift. 07/28/21: performed in parallel bars with no LOB and no net increase in pain/sypmtoms    Time 8    Period Weeks    Status Achieved    Target Date 08/10/21      PT LONG TERM GOAL #3   Title Patient will improve MMT to 4+/5 for all tested LE musculature indicative of improved strength as needed for improved performance of transferring and as needed to produce power to prevent LOB during episode of large postural perturbation    Baseline IE: MMT 3+ to 4/5 for hip musculature (flexion, ER, IR, ABD), 4/5 for hamstrings and ankle dorsiflexors. 07/15/21: Not met for ER/IR/ABD. otherwise 4+ or greater. 08/11/21: Not met for hip ER/IR/ABD    Time 8    Period Weeks    Status Partially Met    Target Date 08/27/21      PT LONG TERM GOAL #4   Title Patient will ambulate x 300 feet in clinic with no AD with symmetrical weight shift and normal heel to toe progression without LOB and no reproduction of pain indicative of improved community-level gait    Baseline IE: Gait with antalgic pattern  and decreased stance time LLE. 07/15/21: improving weight  shift to LLE, though pt still demonstrates moderate antalgic pattern that is worse following prolonged sitting. 08/11/21: Intermittent improved weight shift, though antalgic pattern is exacerbated after period of sitting/immobility    Time 8    Period Weeks    Status Partially Met    Target Date 08/27/21                   Plan - 08/22/21 1050     Clinical Impression Statement Patient has experienced difficulty with attempted progression of hip strengthening over the last 2 weeks and has ongoing gait deficits that are worse following period of sitting. She has maintained pain at moderate level 4-5, but she had notable pain over the weekend without significant increase in activity outside of PT or notable aggravating factor - pt believes that hip abductor isotonics could be a factor, though she did have a delayed onset of pain with increase in pain beginning the day after physical therapy. Pt has had plateau in ROM progress. Pt has remaining deficits in hip and LE strength, hip ROM, gait deviations, and pain. Pt is to follow-up with referring provider after completing current POC at end of August. Patient will benefit from continued skilled PT intervention to address the above impairments and activity limitations for best return to PLOF.    Personal Factors and Comorbidities Age;Comorbidity 3+;Time since onset of injury/illness/exacerbation    Comorbidities CAD, acid reflux, s/p TIA    Examination-Activity Limitations Bed Mobility;Stairs;Stand;Dressing;Bathing;Bend;Locomotion Level;Squat    Examination-Participation Restrictions Community Activity;Cleaning;Laundry    Stability/Clinical Decision Making Evolving/Moderate complexity    Rehab Potential Good    PT Frequency 2x / week    PT Duration 8 weeks    PT Treatment/Interventions ADLs/Self Care Home Management    PT Next Visit Plan Manual therapy with coxafemoral mobilization  techniques and STM for symptom modulation, hip ROM, unloaded isotonics with gradual transition to progressive weightbearing and CKC strengthening    PT Home Exercise Plan Access Code [phone].             Patient will benefit from skilled therapeutic intervention in order to improve the following deficits and impairments:  Abnormal gait, Hypomobility, Difficulty walking, Decreased strength, Pain, Decreased range of motion  Visit Diagnosis: Pain in left hip  Difficulty in walking, not elsewhere classified  Muscle weakness (generalized)  Stiffness of left hip, not elsewhere classified     Problem List Patient Active Problem List   Diagnosis Date Noted   Unstable angina ([place]) 01/19/2017   CAD (coronary artery disease) 01/19/2017   Valentina Gu, PT, DPT #[id number]  Eilleen Kempf 08/22/2021, 10:50 AM  [place] [facility] [address] [place], [place], [address] Phone: [phone]   Fax:  [phone]  Name: Kristen Lloyd MRN: [id number] Date of Birth: 1941-10-13

## 2021-08-22 ENCOUNTER — Encounter: Payer: Self-pay | Admitting: Physical Therapy

## 2021-08-25 ENCOUNTER — Ambulatory Visit: Payer: Medicare Other | Admitting: Physical Therapy

## 2021-08-25 ENCOUNTER — Other Ambulatory Visit: Payer: Self-pay

## 2021-08-25 ENCOUNTER — Encounter: Payer: Self-pay | Admitting: Physical Therapy

## 2021-08-25 DIAGNOSIS — M25652 Stiffness of left hip, not elsewhere classified: Secondary | ICD-10-CM

## 2021-08-25 DIAGNOSIS — M25552 Pain in left hip: Secondary | ICD-10-CM | POA: Diagnosis not present

## 2021-08-25 DIAGNOSIS — M6281 Muscle weakness (generalized): Secondary | ICD-10-CM

## 2021-08-25 DIAGNOSIS — R262 Difficulty in walking, not elsewhere classified: Secondary | ICD-10-CM

## 2021-08-25 NOTE — Therapy (Signed)
[facility] [address] [place], [place], [address] Phone: [phone]   Fax:  [phone]  Physical Therapy Treatment  Patient Details  Name: Kristen Lloyd MRN: [id number] Date of Birth: 08/30/1941 Referring Provider (PT): Allene Dillon, FNP   Encounter Date: 08/25/2021   PT End of Session - 08/26/21 1521     Visit Number 19    Number of Visits 21    Date for PT Re-Evaluation 08/11/21    Authorization Time Period Cert 2/33-6/12    Progress Note Due on Visit 20    PT Start Time 1600    PT Stop Time 1645    PT Time Calculation (min) 45 min    Activity Tolerance Patient tolerated treatment well;Patient limited by pain    Behavior During Therapy [facility] for tasks assessed/performed             Past Medical History:  Diagnosis Date   Acid reflux    Coronary artery disease    Diverticulitis    Stroke [facility])     Past Surgical History:  Procedure Laterality Date   ABDOMINAL HYSTERECTOMY     BREAST BIOPSY Left 1990's   lt bx x 2-neg   CARDIAC CATHETERIZATION N/A 01/19/2017   Procedure: Left Heart Cath and Coronary Angiography;  Surgeon: Teodoro Spray, MD;  Location: [place] [facility];  Service: Cardiovascular;  Laterality: N/A;   CARDIAC CATHETERIZATION N/A 01/19/2017   Procedure: Coronary Stent Intervention;  Surgeon: Isaias Cowman, MD;  Location: [place] [facility];  Service: Cardiovascular;  Laterality: N/A;   CARDIAC SURGERY     Ileostomy and reversal N/A 2021   TONSILLECTOMY      There were no vitals filed for this visit.   Subjective Assessment - 08/25/21 1609     Subjective Patient reports pain affecting L gluteal region. She reports 5-6/10 pain at arrival to PT. She reports not doing much of her exercises over the weekend due to ongoing discomfort. She has scheduled follow up with referring provider given ongoing issues with L iliolumbar region and hip over the previous 2 weeks.    Pertinent History  Patient is an 80 year old female with primary complaint of L hip pain. Patient has referring diagnosis of L hip OA. Patient reports about 2 years of hip pain. She reports she was doing senior fitness program at [facility] at the time her symptoms began. She states that the staff noticed a change in her gait pattern and she felt minmal pain. This has since worsened. Patient states she was very sick last year and didn't do as much exercise at the time. Patient had ileostomy and reversal in 2021 with significant GI symptoms - significant recovery time required and patient had notably decreased physical activity. Patient did have steroid injection 05/19/2021 that did provide moderate relief. She feels that later in the day she is getting pain into thoracolumbar region from the effect her condition has on her walking. She states that during the day it "isn't that bad." Patient reports no night pain. Pain is worse in AM. Hx of TIA in 2015. No numbness or tingling. Patient stated goals: to avoid surgery and to walk better.    Limitations Standing;Walking;House hold activities    How long can you stand comfortably? 5-10 minutes    How long can you walk comfortably? 30 minutes    Diagnostic tests L hip radiographs; evidence of OA/degenerative change    Patient Stated Goals To avoid surgery,  able to walk better                Treatment Performed   NuStep x 5 minutes, Level 3; seat 6, arms 7 - unbilled time     Manual Therapy - for joint and soft tissue mobility, symptom modulation, hip ROM   L hip PROM within pt tolerance, including L hip extension PROM in R sidelying   STM and IASTM with Hypervolt along bilat L3-L5 erector spinae (emphasis on L side) and L gluteal mm in R sidelying    L hip long-axis distraction in supine for axial decompression of LLE weightbearing joints  Attempted inferior mobilization in 90 deg hip flexion (no belt) - stopped due to intolerance of direct pressure on  proximal thigh     *not today* STM along adductor magnus STM/IASTM with Theraband roller along rectus femoris and vastus lateralis, adductor longus and magnus L hip gentle adductor stretch; 3x30sec L hip posterior mobilization (axial load through femur) with conjunct passive ER/IR within pt tolerance  L hip lateral distraction with Mulligan belt, intermittent L hip MWM (c belt distraction) with hip flexion      Therapeutic exercise - for improved strength as needed for ability to perform transferring, weightbearing activity, obstacle negotiation; for soft tissue mobility and ROM     Sidelying clamshell; 2x10, Green Tband Sit to stand, raised table; 2x10, with 6-lb Med ball goblet hold [employer], blue swiss ball; 3-way (forward,  L and R diagonal); x5 ea dir   *not today* Lower trunk rotations; x10 alternating Sidelying hip abduction; 1x10 Supine hip abduction AROM; x20 Seated knee extension; 2x10, bilateral; 7.5-lb cuff weight  Butterfly stretch; 3x30sec, hooklying Open book; x10 ea side SLR; 2x10 Glute bridge; 2x10 Butterfly stretch; 3x30sec, hooklying         Neuromuscular Re-education - for movement control and trunk stabilization, targeting cueing for gluteal activation    3-way Hip, standing with bilat UE support; x8 ea dir, bilat, with 3-lb cuff weight     *next visit* Sidestep with Red Tband at distal shins; 3x down/back, 10-ft course      *not today* Hurdle stepping, 3 6-inch hurdles; 4x down/back length of // bars Tandem stance; 1x30 seconds, bilaterally  Tandem stance on foam; 1x30seconds, bilaterally Dynamic march; x5 D/B length of // bars Clamshell; 2x10, R sidelying [verbal cueing, tactile cueing, and demo for technique and to prevent trunk rotation compensation] Standing march; x10 alternating with unilateral table support  Pelvic tilt; 2x10, hooklying, anterior/posterior [minimal verbal and tactile cueing]       Multimodal cueing  including tactile and verbal cueing as well as exercise demonstration utilized throughout session to optimize technique. Home exercise program discussed and reviewed throughout session.        ASSESSMENT Patient has had difficulty with recurrent pain affecting L iliolumbar region, L lumbar paraspinal region, and L gluteal region recently. She has ongoing gait deficits with decreased weightbearing onto LLE with antalgic pattern. She reports minimal symptoms following manual therapy today, but she has ongoing difficulty with weightbearing on L side. She tolerates moderate low-impact isotonics well today. Pt has had plateau in ROM progress. Pt has remaining deficits in hip and LE strength, hip ROM, gait deviations, and pain. Pt has follow-up with referring provider this week. Patient will benefit from continued skilled PT intervention to address the above impairments and activity limitations for best return to PLOF.       PT Short Term Goals - 08/11/21 1647  PT SHORT TERM GOAL #1   Title Pt will be independent and 100% compliant with established HEP and activity modification as needed to augment PT intervention and improve pertinent strength and mobility deficits as needed for best return to prior level of function.    Baseline HEP provided at IE 06/15/21. 07/16/21: compliant and independent with exercise technique    Time 3    Period Weeks    Status Achieved    Target Date 07/06/21      PT SHORT TERM GOAL #2   Title Patient will perform independent sit to stand without UE support and no increase in pain indicative of improved functional LE strength and ability to perform transferring necessary for accessing home and community environment    Baseline IE: Significant difficulty with sit to stand with heavy UE support required. 07/16/21: performed with moderate L hip pain c weightbearing. 08/11/21: performed without increase in pain    Time 4    Period Weeks    Status Achieved    Target Date  07/30/21               PT Long Term Goals - 08/12/21 2009       PT LONG TERM GOAL #1   Title Patient will demonstrate improved function as evidenced by a score of 59 on FOTO measure for full participation in activities at home and in the community.    Baseline 06/15/21: 50. 07/15/21: 49. 08/12/21: 49    Time 8    Period Weeks    Status Not Met    Target Date 08/27/21      PT LONG TERM GOAL #2   Title Patient will perform consecutive hurdle step over 3 6-inch hurdles with good heel to toe progression, no LOB, no buckling off affected LE, and no increase in pain indicative of improved ability to weight shift to affected LE and clear lower limb over obstacles as needed for functional mobility    Baseline IE: Antalgic gait pattern wth decreased LLE weight shift. 07/15/21: able to perform dynamic march in // bars with sound weight shift. 07/28/21: performed in parallel bars with no LOB and no net increase in pain/sypmtoms    Time 8    Period Weeks    Status Achieved    Target Date 08/10/21      PT LONG TERM GOAL #3   Title Patient will improve MMT to 4+/5 for all tested LE musculature indicative of improved strength as needed for improved performance of transferring and as needed to produce power to prevent LOB during episode of large postural perturbation    Baseline IE: MMT 3+ to 4/5 for hip musculature (flexion, ER, IR, ABD), 4/5 for hamstrings and ankle dorsiflexors. 07/15/21: Not met for ER/IR/ABD. otherwise 4+ or greater. 08/11/21: Not met for hip ER/IR/ABD    Time 8    Period Weeks    Status Partially Met    Target Date 08/27/21      PT LONG TERM GOAL #4   Title Patient will ambulate x 300 feet in clinic with no AD with symmetrical weight shift and normal heel to toe progression without LOB and no reproduction of pain indicative of improved community-level gait    Baseline IE: Gait with antalgic pattern and decreased stance time LLE. 07/15/21: improving weight shift to LLE, though pt  still demonstrates moderate antalgic pattern that is worse following prolonged sitting. 08/11/21: Intermittent improved weight shift, though antalgic pattern is exacerbated after period of sitting/immobility  Time 8    Period Weeks    Status Partially Met    Target Date 08/27/21                   Plan - 08/26/21 1530     Clinical Impression Statement Patient has had difficulty with recurrent pain affecting L iliolumbar region, L lumbar paraspinal region, and L gluteal region recently. She has ongoing gait deficits with decreased weightbearing onto LLE with antalgic pattern. She reports minimal symptoms following manual therapy today, but she has ongoing difficulty with weightbearing on L side. She tolerates moderate low-impact isotonics well today. Pt has had plateau in ROM progress. Pt has remaining deficits in hip and LE strength, hip ROM, gait deviations, and pain. Pt has follow-up with referring provider this week. Patient will benefit from continued skilled PT intervention to address the above impairments and activity limitations for best return to PLOF.    Personal Factors and Comorbidities Age;Comorbidity 3+;Time since onset of injury/illness/exacerbation    Comorbidities CAD, acid reflux, s/p TIA    Examination-Activity Limitations Bed Mobility;Stairs;Stand;Dressing;Bathing;Bend;Locomotion Level;Squat    Examination-Participation Restrictions Community Activity;Cleaning;Laundry    Stability/Clinical Decision Making Evolving/Moderate complexity    Rehab Potential Good    PT Frequency 2x / week    PT Duration 8 weeks    PT Treatment/Interventions ADLs/Self Care Home Management    PT Next Visit Plan Manual therapy with coxafemoral mobilization techniques and STM for symptom modulation, hip ROM, unloaded isotonics with gradual transition to progressive weightbearing and CKC strengthening    PT Home Exercise Plan Access Code [phone].             Patient will benefit from  skilled therapeutic intervention in order to improve the following deficits and impairments:  Abnormal gait, Hypomobility, Difficulty walking, Decreased strength, Pain, Decreased range of motion  Visit Diagnosis: Pain in left hip  Difficulty in walking, not elsewhere classified  Muscle weakness (generalized)  Stiffness of left hip, not elsewhere classified     Problem List Patient Active Problem List   Diagnosis Date Noted   Unstable angina ([place]) 01/19/2017   CAD (coronary artery disease) 01/19/2017   Valentina Gu, PT, DPT #[id number]  Eilleen Kempf 08/26/2021, 3:32 PM  [facility] [address] [place], [place], [address] Phone: [phone]   Fax:  [phone]  Name: Kristen Lloyd MRN: [id number] Date of Birth: 1941-12-24

## 2021-08-26 ENCOUNTER — Other Ambulatory Visit: Payer: Self-pay | Admitting: Family Medicine

## 2021-08-26 DIAGNOSIS — M5416 Radiculopathy, lumbar region: Secondary | ICD-10-CM

## 2021-08-27 ENCOUNTER — Ambulatory Visit: Payer: Medicare Other | Attending: Family Medicine | Admitting: Physical Therapy

## 2021-08-27 ENCOUNTER — Other Ambulatory Visit: Payer: Self-pay

## 2021-08-27 DIAGNOSIS — M25552 Pain in left hip: Secondary | ICD-10-CM | POA: Insufficient documentation

## 2021-08-27 DIAGNOSIS — M25652 Stiffness of left hip, not elsewhere classified: Secondary | ICD-10-CM | POA: Diagnosis present

## 2021-08-27 DIAGNOSIS — R262 Difficulty in walking, not elsewhere classified: Secondary | ICD-10-CM | POA: Diagnosis present

## 2021-08-27 DIAGNOSIS — M6281 Muscle weakness (generalized): Secondary | ICD-10-CM | POA: Insufficient documentation

## 2021-08-27 NOTE — Therapy (Signed)
[facility] [address] [place], [place], [address] Phone: [phone]   Fax:  [phone]  Physical Therapy Treatment/ Physical Therapy Progress Note/Re-certification   Dates of reporting period  08/11/21   to   08/27/21   Patient Details  Name: Kristen Lloyd MRN: [id number] Date of Birth: 1941-07-08 Referring Provider (PT): Allene Dillon, FNP   Encounter Date: 08/27/2021   PT End of Session - 08/31/21 1103     Visit Number 20    Number of Visits 28    Date for PT Re-Evaluation 08/11/21    Authorization Time Period Cert 8/93-8/10, recert 1/75/10-25/85/27    Progress Note Due on Visit 28    PT Start Time 1602    PT Stop Time 1646    PT Time Calculation (min) 44 min    Activity Tolerance Patient tolerated treatment well;Patient limited by pain    Behavior During Therapy [facility] for tasks assessed/performed             Past Medical History:  Diagnosis Date   Acid reflux    Coronary artery disease    Diverticulitis    Stroke [facility])     Past Surgical History:  Procedure Laterality Date   ABDOMINAL HYSTERECTOMY     BREAST BIOPSY Left 1990's   lt bx x 2-neg   CARDIAC CATHETERIZATION N/A 01/19/2017   Procedure: Left Heart Cath and Coronary Angiography;  Surgeon: Teodoro Spray, MD;  Location: [facility];  Service: Cardiovascular;  Laterality: N/A;   CARDIAC CATHETERIZATION N/A 01/19/2017   Procedure: Coronary Stent Intervention;  Surgeon: Isaias Cowman, MD;  Location: [facility];  Service: Cardiovascular;  Laterality: N/A;   CARDIAC SURGERY     Ileostomy and reversal N/A 2021   TONSILLECTOMY      There were no vitals filed for this visit.   Subjective Assessment - 08/31/21 1103     Subjective Patient had follow-up with [facility] yesterday. Patient's provider believes that a lot of symptoms are stemming from lumbar spine. Patient has MRI for lumbar spine scheduled 09/08/21. Patient reports  50% SANE score to date. Patient reports 5-6/10 pain at arrival to PT. Patient reports she wants to be able to walk around her house better. Patient reports she can negotiate stairs to access home and attic, but she can "feel it" when performing these activities.    Pertinent History Patient is an 80 year old female with primary complaint of L hip pain. Patient has referring diagnosis of L hip OA. Patient reports about 2 years of hip pain. She reports she was doing senior fitness program at [facility] at the time her symptoms began. She states that the staff noticed a change in her gait pattern and she felt minmal pain. This has since worsened. Patient states she was very sick last year and didn't do as much exercise at the time. Patient had ileostomy and reversal in 2021 with significant GI symptoms - significant recovery time required and patient had notably decreased physical activity. Patient did have steroid injection 05/19/2021 that did provide moderate relief. She feels that later in the day she is getting pain into thoracolumbar region from the effect her condition has on her walking. She states that during the day it "isn't that bad." Patient reports no night pain. Pain is worse in AM. Hx of TIA in 2015. No numbness or tingling. Patient stated goals: to avoid surgery and to walk better.    Limitations Standing;Walking;House  hold activities    How long can you stand comfortably? 10-15 minutes    How long can you walk comfortably? 10-15 minutes    Diagnostic tests L hip radiographs; evidence of OA/degenerative change    Patient Stated Goals To avoid surgery, able to walk better              OBJECTIVE FINDINGS     Posture Decreased lumbar lordosis, forward trunk lean during static standing   Gait Antalgic pattern with decreased stance time LLE, decreased LLE weight shift during stance phase, good stride and gait velocity   Palpation Tenderness to palpation along L greater  trochanter, L piriformis/gemelli complex, L gluteus medius/minimus, L L3-S1 longissimus lumborum   Strength (out of 5) R/L 5/5- Hip flexion 5/4- Hip ER 4+/4 Hip IR Not tested/3+ Hip abduction 5/5 Hip adduction 5/5 Knee extension 5/4+ Knee flexion 5/5 Ankle dorsiflexion *Indicates pain     AROM (degrees) R/L (all movements include overpressure unless otherwise stated) Lumbar forward flexion (65): 75%  Lumbar extension (30): <25 Lumbar lateral flexion (25): R: 50% L: 25%* Thoracic and Lumbar rotation (30 degrees):  R: 50%* (pain in groin) L: 50% Hip ER (0-45): R: 45, L: 15 Hip Flexion (0-125): R: 110, L 100* Hip Abduction (0-40): R: 40 L: 17 *Indicates pain     PROM (degrees) PROM = AROM R/L Hip IR (0-45): R: 15 L: 16 Hip ER (0-45): R: 40, L: 40 Hip Flexion (0-125): R: 110, L 110* Hip Abduction (0-40): R: 40 L: 23 Hip extension (0-15): R: not tested L: to neutral * *Indicates pain            Treatment Performed    NuStep x 5 minutes, Level 3; seat 6, arms 7 - unbilled time    Therapeutic Activities  Re-assessment performed (see above)   Manual Therapy - for joint and soft tissue mobility, symptom modulation, hip ROM   L hip PROM within pt tolerance, including L hip extension PROM in R sidelying    STM and IASTM with [employer] along bilat L3-L5 erector spinae (emphasis on L side) and L gluteal mm in R sidelying          *not today*  L hip long-axis distraction in supine for axial decompression of LLE weightbearing joints Attempted inferior mobilization in 90 deg hip flexion (no belt) - stopped due to intolerance of direct pressure on proximal thigh STM along adductor magnus STM/IASTM with Theraband roller along rectus femoris and vastus lateralis, adductor longus and magnus L hip gentle adductor stretch; 3x30sec L hip posterior mobilization (axial load through femur) with conjunct passive ER/IR within pt tolerance L hip lateral distraction with  Mulligan belt, intermittent L hip MWM (c belt distraction) with hip flexion      Therapeutic exercise - for improved strength as needed for ability to perform transferring, weightbearing activity, obstacle negotiation; for soft tissue mobility and ROM     *next visit* Sidelying clamshell; 2x10, Green Tband Sit to stand, raised table; 2x10, with 6-lb Med ball goblet hold      *not today* [employer], blue swiss ball; 3-way (forward,  L and R diagonal); x5 ea dir Lower trunk rotations; x10 alternating Sidelying hip abduction; 1x10 Supine hip abduction AROM; x20 Seated knee extension; 2x10, bilateral; 7.5-lb cuff weight Butterfly stretch; 3x30sec, hooklying Open book; x10 ea side SLR; 2x10 Glute bridge; 2x10 Butterfly stretch; 3x30sec, hooklying         Neuromuscular Re-education - for movement control and  trunk stabilization, targeting cueing for gluteal activation     *next visit* 3-way Hip, standing with bilat UE support; x8 ea dir, bilat, with 3-lb cuff weight  Sidestep with Red Tband at distal shins; 3x down/back, 10-ft course       *not today* Hurdle stepping, 3 6-inch hurdles; 4x down/back length of // bars Tandem stance; 1x30 seconds, bilaterally  Tandem stance on foam; 1x30seconds, bilaterally Dynamic march; x5 D/B length of // bars Clamshell; 2x10, R sidelying [verbal cueing, tactile cueing, and demo for technique and to prevent trunk rotation compensation] Standing march; x10 alternating with unilateral table support  Pelvic tilt; 2x10, hooklying, anterior/posterior [minimal verbal and tactile cueing]       Multimodal cueing including tactile and verbal cueing as well as exercise demonstration utilized throughout session to optimize technique. Home exercise program discussed and reviewed throughout session.        ASSESSMENT Patient demonstrates fair improvement in strength testing and has met majority of performance-based goals with exception of  goal related to gait quality. She has not fully met MMT goal to date. She has been able to make measurable change in FOTO to date with 3 status updates completed. She has had difficulty with tolerance of Mulligan manual techniques and has short-term response with use of STM/IASTM. PT has shifted focus to working on graded exercise and strengthening with pt tolerating most drills well, but having difficulty with progression of gluteal isotonics with soreness persisting longer than expected following exercise. Pt has relative plateau in hip ROM progress given significant joint restriction/stiffness; she does have improved tolerance of thoracolumbar ROM compared to last progress note. Pt has experienced relatively slow progress and has made modest progress over last 4 weeks with ongoing significant gait, ROM, and strength deficits remaining. She is now awaiting MRI of lumbar spine recommended by referring provider. Pt has remaining deficits in hip and LE strength, hip ROM, gait deviations, LLE weight shift/weight acceptance, and L iliolumbar/gluteal/groin pain. Patient will benefit from continued skilled PT intervention to address the above impairments and activity limitations for best return to PLOF.           PT Short Term Goals - 08/11/21 1647       PT SHORT TERM GOAL #1   Title Pt will be independent and 100% compliant with established HEP and activity modification as needed to augment PT intervention and improve pertinent strength and mobility deficits as needed for best return to prior level of function.    Baseline HEP provided at IE 06/15/21. 07/16/21: compliant and independent with exercise technique    Time 3    Period Weeks    Status Achieved    Target Date 07/06/21      PT SHORT TERM GOAL #2   Title Patient will perform independent sit to stand without UE support and no increase in pain indicative of improved functional LE strength and ability to perform transferring necessary for accessing  home and community environment    Baseline IE: Significant difficulty with sit to stand with heavy UE support required. 07/16/21: performed with moderate L hip pain c weightbearing. 08/11/21: performed without increase in pain    Time 4    Period Weeks    Status Achieved    Target Date 07/30/21               PT Long Term Goals - 08/31/21 1114       PT LONG TERM GOAL #1   Title Patient will demonstrate  improved function as evidenced by a score of 59 on FOTO measure for full participation in activities at home and in the community.    Baseline 06/15/21: 50. 07/15/21: 49. 08/12/21: 49. 08/27/21: 47    Time 8    Period Weeks    Status Not Met    Target Date 09/24/21      PT LONG TERM GOAL #2   Title Patient will perform consecutive hurdle step over 3 6-inch hurdles with good heel to toe progression, no LOB, no buckling off affected LE, and no increase in pain indicative of improved ability to weight shift to affected LE and clear lower limb over obstacles as needed for functional mobility    Baseline IE: Antalgic gait pattern wth decreased LLE weight shift. 07/15/21: able to perform dynamic march in // bars with sound weight shift. 07/28/21: performed in parallel bars with no LOB and no net increase in pain/sypmtoms    Time 8    Period Weeks    Status Achieved    Target Date 08/10/21      PT LONG TERM GOAL #3   Title Patient will improve MMT to 4+/5 for all tested LE musculature indicative of improved strength as needed for improved performance of transferring and as needed to produce power to prevent LOB during episode of large postural perturbation    Baseline IE: MMT 3+ to 4/5 for hip musculature (flexion, ER, IR, ABD), 4/5 for hamstrings and ankle dorsiflexors. 07/15/21: Not met for ER/IR/ABD. otherwise 4+ or greater. 08/11/21: Not met for hip ER/IR/ABD. 08/27/21: Not met for hip ER/IR/ABD    Time 8    Period Weeks    Status Partially Met    Target Date 09/24/21      PT LONG TERM GOAL #4    Title Patient will ambulate x 300 feet in clinic with no AD with symmetrical weight shift and normal heel to toe progression without LOB and no reproduction of pain indicative of improved community-level gait    Baseline IE: Gait with antalgic pattern and decreased stance time LLE. 07/15/21: improving weight shift to LLE, though pt still demonstrates moderate antalgic pattern that is worse following prolonged sitting. 08/11/21: Intermittent improved weight shift, though antalgic pattern is exacerbated after period of sitting/immobility. 08/27/21: pt demonstrates good heel to toe progression and normal stride, but decreased L weight shift/antalgic pattern is present    Time 8    Period Weeks    Status Partially Met    Target Date 09/24/21                   Plan - 08/31/21 1125     Clinical Impression Statement Patient demonstrates fair improvement in strength testing and has met majority of performance-based goals with exception of goal related to gait quality. She has not fully met MMT goal to date. She has been able to make measurable change in FOTO to date with 3 status updates completed. She has had difficulty with tolerance of Mulligan manual techniques and has short-term response with use of STM/IASTM. PT has shifted focus to working on graded exercise and strengthening with pt tolerating most drills well, but having difficulty with progression of gluteal isotonics with soreness persisting longer than expected following exercise. Pt has relative plateau in hip ROM progress given significant joint restriction/stiffness; she does have improved tolerance of thoracolumbar ROM compared to last progress note. Pt has experienced relatively slow progress and has made modest progress over last 4 weeks with ongoing  significant gait, ROM, and strength deficits remaining. She is now awaiting MRI of lumbar spine recommended by referring provider. Pt has remaining deficits in hip and LE strength, hip ROM,  gait deviations, LLE weight shift/weight acceptance, and L iliolumbar/gluteal/groin pain. Patient will benefit from continued skilled PT intervention to address the above impairments and activity limitations for best return to PLOF.    Personal Factors and Comorbidities Age;Comorbidity 3+;Time since onset of injury/illness/exacerbation    Comorbidities CAD, acid reflux, s/p TIA    Examination-Activity Limitations Bed Mobility;Stairs;Stand;Dressing;Bathing;Bend;Locomotion Level;Squat    Examination-Participation Restrictions Community Activity;Cleaning;Laundry    Stability/Clinical Decision Making Evolving/Moderate complexity    Rehab Potential Good    PT Frequency 2x / week    PT Duration 8 weeks    PT Treatment/Interventions ADLs/Self Care Home Management    PT Next Visit Plan Increasing emphasis on increasing intensity of unloaded isotonics with progressive weightbearing and CKC strengthening as tolerated. MT to modulate symptoms and improve tolerance of active drills in clinic prn.    PT Home Exercise Plan Access Code [phone].             Patient will benefit from skilled therapeutic intervention in order to improve the following deficits and impairments:  Abnormal gait, Hypomobility, Difficulty walking, Decreased strength, Pain, Decreased range of motion  Visit Diagnosis: Pain in left hip  Difficulty in walking, not elsewhere classified  Muscle weakness (generalized)  Stiffness of left hip, not elsewhere classified     Problem List Patient Active Problem List   Diagnosis Date Noted   Unstable angina ([place]) 01/19/2017   CAD (coronary artery disease) 01/19/2017   Valentina Gu, PT, DPT #[id number]  Eilleen Kempf 08/31/2021, 11:30 AM  [facility] [address] [place], [place], [address] Phone: [phone]   Fax:  [phone]  Name: Kristen Lloyd MRN: [id number] Date of Birth: 1941/11/06

## 2021-08-31 ENCOUNTER — Encounter: Payer: Self-pay | Admitting: Physical Therapy

## 2021-09-03 ENCOUNTER — Ambulatory Visit: Payer: Medicare Other | Admitting: Physical Therapy

## 2021-09-03 ENCOUNTER — Other Ambulatory Visit: Payer: Self-pay

## 2021-09-03 DIAGNOSIS — M25552 Pain in left hip: Secondary | ICD-10-CM | POA: Diagnosis not present

## 2021-09-03 DIAGNOSIS — R262 Difficulty in walking, not elsewhere classified: Secondary | ICD-10-CM

## 2021-09-03 DIAGNOSIS — M6281 Muscle weakness (generalized): Secondary | ICD-10-CM

## 2021-09-03 DIAGNOSIS — M25652 Stiffness of left hip, not elsewhere classified: Secondary | ICD-10-CM

## 2021-09-03 NOTE — Therapy (Signed)
[place] [facility] [address] [place], [place], [address] Phone: [phone]   Fax:  [phone]  Physical Therapy Treatment  Patient Details  Name: Kristen Lloyd MRN: [id number] Date of Birth: October 08, 1941 Referring Provider (PT): Allene Dillon, FNP   Encounter Date: 09/03/2021   PT End of Session - 09/03/21 1714     Visit Number 21    Number of Visits 28    Date for PT Re-Evaluation 08/11/21    Authorization Time Period Cert 4/17-4/08, recert 1/44/81-85/63/14    Progress Note Due on Visit 28    PT Start Time 1603    PT Stop Time 1653    PT Time Calculation (min) 50 min    Activity Tolerance Patient tolerated treatment well;Patient limited by pain    Behavior During Therapy [facility] for tasks assessed/performed             Past Medical History:  Diagnosis Date   Acid reflux    Coronary artery disease    Diverticulitis    Stroke [facility])     Past Surgical History:  Procedure Laterality Date   ABDOMINAL HYSTERECTOMY     BREAST BIOPSY Left 1990's   lt bx x 2-neg   CARDIAC CATHETERIZATION N/A 01/19/2017   Procedure: Left Heart Cath and Coronary Angiography;  Surgeon: Teodoro Spray, MD;  Location: [place] [facility];  Service: Cardiovascular;  Laterality: N/A;   CARDIAC CATHETERIZATION N/A 01/19/2017   Procedure: Coronary Stent Intervention;  Surgeon: Isaias Cowman, MD;  Location: [place] [facility];  Service: Cardiovascular;  Laterality: N/A;   CARDIAC SURGERY     Ileostomy and reversal N/A 2021   TONSILLECTOMY      There were no vitals filed for this visit.   Subjective Assessment - 09/03/21 1609     Subjective Patient has upcoming MRI scheduled for 09/08/21. She finished her prednisone taper earlier this week. She reports feeling very well on Sunday, but she has had pain affecting L SI region and L gluteal region mainly with weightbearing over the last few days. She reports having no pain in lying or sitting, but  ongoing pain with L stance phase of gait. Pt reports pain along L iliolumbar/SI region with performance of butterfly stretch.    Pertinent History Patient is an 80 year old female with primary complaint of L hip pain. Patient has referring diagnosis of L hip OA. Patient reports about 2 years of hip pain. She reports she was doing senior fitness program at [facility] at the time her symptoms began. She states that the staff noticed a change in her gait pattern and she felt minmal pain. This has since worsened. Patient states she was very sick last year and didn't do as much exercise at the time. Patient had ileostomy and reversal in 2021 with significant GI symptoms - significant recovery time required and patient had notably decreased physical activity. Patient did have steroid injection 05/19/2021 that did provide moderate relief. She feels that later in the day she is getting pain into thoracolumbar region from the effect her condition has on her walking. She states that during the day it "isn't that bad." Patient reports no night pain. Pain is worse in AM. Hx of TIA in 2015. No numbness or tingling. Patient stated goals: to avoid surgery and to walk better.    Limitations Standing;Walking;House hold activities    How long can you stand comfortably? 10-15 minutes    How long can you walk comfortably?  10-15 minutes    Diagnostic tests L hip radiographs; evidence of OA/degenerative change    Patient Stated Goals To avoid surgery, able to walk better             OBJECTIVE FINDINGS  Lumbar traction (long-axis bilateral distraction in hooklying): No pain reported during, mild axial pain in sacral region with tensile load removed  Lumbar extension-rotation: Negative SIJ compression: Negative SI Thrust: Not examined due to positional intolerance for prone lying  SLUMP: R Negative, L Negative SLR: R Negative, L Negative     Treatment Performed     NuStep x 5 minutes, Level 3; seat 5,  arms 7 - unbilled time     TREATMENT    Manual Therapy - for joint and soft tissue mobility, symptom modulation, hip ROM   L hip PROM within pt tolerance    STM and IASTM with [employer] along bilat L3-L5 erector spinae (emphasis on L side) and L gluteal mm in R sidelying    Lumbar spine distraction in supine with Mulligan belt, intermittent      *not today*  L hip long-axis distraction in supine for axial decompression of LLE weightbearing joints Attempted inferior mobilization in 90 deg hip flexion (no belt) - stopped due to intolerance of direct pressure on proximal thigh STM along adductor magnus STM/IASTM with Theraband roller along rectus femoris and vastus lateralis, adductor longus and magnus L hip gentle adductor stretch; 3x30sec L hip posterior mobilization (axial load through femur) with conjunct passive ER/IR within pt tolerance L hip lateral distraction with Mulligan belt, intermittent L hip MWM (c belt distraction) with hip flexion      Therapeutic exercise - for improved strength as needed for ability to perform transferring, weightbearing activity, obstacle negotiation; for soft tissue mobility and ROM    Sidelying clamshell; 2x10, Green Tband  Sit to stand, raised table; 2x10, with 6-lb Med ball goblet hold   Patient education: discussed HEP modification for butterfly stretch, bent-knee fallout    *not today* [employer], blue swiss ball; 3-way (forward,  L and R diagonal); x5 ea dir Lower trunk rotations; x10 alternating Sidelying hip abduction; 1x10 Supine hip abduction AROM; x20 Seated knee extension; 2x10, bilateral; 7.5-lb cuff weight Butterfly stretch; 3x30sec, hooklying Open book; x10 ea side SLR; 2x10 Glute bridge; 2x10 Butterfly stretch; 3x30sec, hooklying         Neuromuscular Re-education - for movement control and trunk stabilization, targeting cueing for gluteal activation    3-way Hip, standing with bilat UE support; x8 ea  dir, bilat, with 3-lb cuff weight   Sidestep with Red Tband at distal shins; 3x down/back, length of // bars       *not today* Hurdle stepping, 3 6-inch hurdles; 4x down/back length of // bars Tandem stance; 1x30 seconds, bilaterally  Tandem stance on foam; 1x30seconds, bilaterally Dynamic march; x5 D/B length of // bars Clamshell; 2x10, R sidelying [verbal cueing, tactile cueing, and demo for technique and to prevent trunk rotation compensation] Standing march; x10 alternating with unilateral table support  Pelvic tilt; 2x10, hooklying, anterior/posterior [minimal verbal and tactile cueing]        ASSESSMENT Patient does not have notable pain with provocative testing today; she has minimal pain during lumbar distraction, but axial pain in sacral region returns following initiation of gait. Patient demonstrates modestly improved weight shift to LLE. Recent prednisone taper could be a confounding variable for testing for pain provocation. Pt has minimal complaint of groin pain at this time,  though this is felt transiently with full weightbearing onto LLE with 3-way hip and during hip extension following ample time spent in hooklying position. Progress has been slow to date, but pt has progressed with strength testing and performance-based goals. Pt has remaining deficits in hip and LE strength, hip ROM, gait deviations, LLE weight shift/weight acceptance, and L iliolumbar/gluteal/groin pain. Patient will benefit from continued skilled PT intervention to address the above impairments and activity limitations for best return to PLOF.       PT Short Term Goals - 08/11/21 1647       PT SHORT TERM GOAL #1   Title Pt will be independent and 100% compliant with established HEP and activity modification as needed to augment PT intervention and improve pertinent strength and mobility deficits as needed for best return to prior level of function.    Baseline HEP provided at IE 06/15/21. 07/16/21:  compliant and independent with exercise technique    Time 3    Period Weeks    Status Achieved    Target Date 07/06/21      PT SHORT TERM GOAL #2   Title Patient will perform independent sit to stand without UE support and no increase in pain indicative of improved functional LE strength and ability to perform transferring necessary for accessing home and community environment    Baseline IE: Significant difficulty with sit to stand with heavy UE support required. 07/16/21: performed with moderate L hip pain c weightbearing. 08/11/21: performed without increase in pain    Time 4    Period Weeks    Status Achieved    Target Date 07/30/21               PT Long Term Goals - 08/31/21 1114       PT LONG TERM GOAL #1   Title Patient will demonstrate improved function as evidenced by a score of 59 on FOTO measure for full participation in activities at home and in the community.    Baseline 06/15/21: 50. 07/15/21: 49. 08/12/21: 49. 08/27/21: 47    Time 8    Period Weeks    Status Not Met    Target Date 09/24/21      PT LONG TERM GOAL #2   Title Patient will perform consecutive hurdle step over 3 6-inch hurdles with good heel to toe progression, no LOB, no buckling off affected LE, and no increase in pain indicative of improved ability to weight shift to affected LE and clear lower limb over obstacles as needed for functional mobility    Baseline IE: Antalgic gait pattern wth decreased LLE weight shift. 07/15/21: able to perform dynamic march in // bars with sound weight shift. 07/28/21: performed in parallel bars with no LOB and no net increase in pain/sypmtoms    Time 8    Period Weeks    Status Achieved    Target Date 08/10/21      PT LONG TERM GOAL #3   Title Patient will improve MMT to 4+/5 for all tested LE musculature indicative of improved strength as needed for improved performance of transferring and as needed to produce power to prevent LOB during episode of large postural  perturbation    Baseline IE: MMT 3+ to 4/5 for hip musculature (flexion, ER, IR, ABD), 4/5 for hamstrings and ankle dorsiflexors. 07/15/21: Not met for ER/IR/ABD. otherwise 4+ or greater. 08/11/21: Not met for hip ER/IR/ABD. 08/27/21: Not met for hip ER/IR/ABD    Time 8  Period Weeks    Status Partially Met    Target Date 09/24/21      PT LONG TERM GOAL #4   Title Patient will ambulate x 300 feet in clinic with no AD with symmetrical weight shift and normal heel to toe progression without LOB and no reproduction of pain indicative of improved community-level gait    Baseline IE: Gait with antalgic pattern and decreased stance time LLE. 07/15/21: improving weight shift to LLE, though pt still demonstrates moderate antalgic pattern that is worse following prolonged sitting. 08/11/21: Intermittent improved weight shift, though antalgic pattern is exacerbated after period of sitting/immobility. 08/27/21: pt demonstrates good heel to toe progression and normal stride, but decreased L weight shift/antalgic pattern is present    Time 8    Period Weeks    Status Partially Met    Target Date 09/24/21                   Plan - 09/03/21 1733     Clinical Impression Statement Patient does not have notable pain with provocative testing today; she has minimal pain during lumbar distraction, but axial pain in sacral region returns following initiation of gait. Patient demonstrates modestly improved weight shift to LLE. Recent prednisone taper could be a confounding variable for testing for pain provocation. Pt has minimal complaint of groin pain at this time, though this is felt transiently with full weightbearing onto LLE with 3-way hip and during hip extension following ample time spent in hooklying position. Progress has been slow to date, but pt has progressed with strength testing and performance-based goals. Pt has remaining deficits in hip and LE strength, hip ROM, gait deviations, LLE weight  shift/weight acceptance, and L iliolumbar/gluteal/groin pain. Patient will benefit from continued skilled PT intervention to address the above impairments and activity limitations for best return to PLOF.    Personal Factors and Comorbidities Age;Comorbidity 3+;Time since onset of injury/illness/exacerbation    Comorbidities CAD, acid reflux, s/p TIA    Examination-Activity Limitations Bed Mobility;Stairs;Stand;Dressing;Bathing;Bend;Locomotion Level;Squat    Examination-Participation Restrictions Community Activity;Cleaning;Laundry    Stability/Clinical Decision Making Evolving/Moderate complexity    Rehab Potential Good    PT Frequency 2x / week    PT Duration 8 weeks    PT Treatment/Interventions ADLs/Self Care Home Management    PT Next Visit Plan Increasing emphasis on progressing intensity of unloaded isotonics with progressive weightbearing and CKC strengthening as tolerated. MT to modulate symptoms and improve tolerance of active drills in clinic prn.    PT Home Exercise Plan Access Code [phone].             Patient will benefit from skilled therapeutic intervention in order to improve the following deficits and impairments:  Abnormal gait, Hypomobility, Difficulty walking, Decreased strength, Pain, Decreased range of motion  Visit Diagnosis: Pain in left hip  Difficulty in walking, not elsewhere classified  Muscle weakness (generalized)  Stiffness of left hip, not elsewhere classified     Problem List Patient Active Problem List   Diagnosis Date Noted   Unstable angina ([place]) 01/19/2017   CAD (coronary artery disease) 01/19/2017   Valentina Gu, PT, DPT #[id number]  Eilleen Kempf, PT 09/03/2021, 5:38 PM  [facility] [address] [place], [place], [address] Phone: [phone]   Fax:  [phone]  Name: ETTIE KRONTZ MRN: [id number] Date of Birth: 1941/03/07

## 2021-09-08 ENCOUNTER — Ambulatory Visit
Admission: RE | Admit: 2021-09-08 | Discharge: 2021-09-08 | Disposition: A | Discharge status: Home / Self Care | Payer: Medicare Other | Source: Ambulatory Visit | Attending: Family Medicine | Admitting: Family Medicine

## 2021-09-08 ENCOUNTER — Other Ambulatory Visit: Payer: Self-pay

## 2021-09-08 ENCOUNTER — Ambulatory Visit: Payer: Medicare Other | Admitting: Physical Therapy

## 2021-09-08 DIAGNOSIS — M25552 Pain in left hip: Secondary | ICD-10-CM

## 2021-09-08 DIAGNOSIS — M6281 Muscle weakness (generalized): Secondary | ICD-10-CM

## 2021-09-08 DIAGNOSIS — M5416 Radiculopathy, lumbar region: Secondary | ICD-10-CM | POA: Diagnosis not present

## 2021-09-08 DIAGNOSIS — R262 Difficulty in walking, not elsewhere classified: Secondary | ICD-10-CM

## 2021-09-08 DIAGNOSIS — M25652 Stiffness of left hip, not elsewhere classified: Secondary | ICD-10-CM

## 2021-09-08 NOTE — Therapy (Signed)
[facility] [address] [place], [place], [address] Phone: [phone]   Fax:  [phone]  Physical Therapy Treatment  Patient Details  Name: Kristen Lloyd MRN: [id number] Date of Birth: 09-22-1941 Referring Provider (PT): Allene Dillon, FNP   Encounter Date: 09/08/2021   PT End of Session - 09/08/21 1751     Visit Number 22    Number of Visits 28    Date for PT Re-Evaluation 08/11/21    Authorization Time Period Cert 1/32-4/40, recert 12/28/70-53/66/44    Progress Note Due on Visit 28    PT Start Time 1600    PT Stop Time 1644    PT Time Calculation (min) 44 min    Activity Tolerance Patient tolerated treatment well;Patient limited by pain    Behavior During Therapy [facility] for tasks assessed/performed             Past Medical History:  Diagnosis Date   Acid reflux    Coronary artery disease    Diverticulitis    Stroke [facility])     Past Surgical History:  Procedure Laterality Date   ABDOMINAL HYSTERECTOMY     BREAST BIOPSY Left 1990's   lt bx x 2-neg   CARDIAC CATHETERIZATION N/A 01/19/2017   Procedure: Left Heart Cath and Coronary Angiography;  Surgeon: Teodoro Spray, MD;  Location: [place] [facility];  Service: Cardiovascular;  Laterality: N/A;   CARDIAC CATHETERIZATION N/A 01/19/2017   Procedure: Coronary Stent Intervention;  Surgeon: Isaias Cowman, MD;  Location: [place] [facility];  Service: Cardiovascular;  Laterality: N/A;   CARDIAC SURGERY     Ileostomy and reversal N/A 2021   TONSILLECTOMY      There were no vitals filed for this visit.   Subjective Assessment - 09/08/21 1602     Subjective Patient had MRI completed earlier today and is awaiting results - pt will discuss them with Whitney Meeler this Friday. Patient reports L side/L lower limb has been feeling well since the weekend. She denies significant L lateral thigh pain at arrival to PT. Patient reports minimal soreness after her last  visit. Patient feels that she did well after last visit. Patient reports some mild lateral hip pain at arrival today - she reports it is "not really painful."    Pertinent History Patient is an 80 year old female with primary complaint of L hip pain. Patient has referring diagnosis of L hip OA. Patient reports about 2 years of hip pain. She reports she was doing senior fitness program at [facility] at the time her symptoms began. She states that the staff noticed a change in her gait pattern and she felt minmal pain. This has since worsened. Patient states she was very sick last year and didn't do as much exercise at the time. Patient had ileostomy and reversal in 2021 with significant GI symptoms - significant recovery time required and patient had notably decreased physical activity. Patient did have steroid injection 05/19/2021 that did provide moderate relief. She feels that later in the day she is getting pain into thoracolumbar region from the effect her condition has on her walking. She states that during the day it "isn't that bad." Patient reports no night pain. Pain is worse in AM. Hx of TIA in 2015. No numbness or tingling. Patient stated goals: to avoid surgery and to walk better.    Limitations Standing;Walking;House hold activities    How long can you stand comfortably? 10-15 minutes  How long can you walk comfortably? 10-15 minutes    Diagnostic tests L hip radiographs; evidence of OA/degenerative change    Patient Stated Goals To avoid surgery, able to walk better                    Treatment Performed     NuStep x 5 minutes, Level 3; seat 5, arms 7 - unbilled time       TREATMENT     Manual Therapy - for joint and soft tissue mobility, symptom modulation, hip ROM   L hip PROM within pt tolerance   Bilateral lower extremity distraction in supine with Mulligan belt, intermittent       *not today*   STM and IASTM with Hypervolt along bilat L3-L5 erector  spinae (emphasis on L side) and L gluteal mm in R sidelying   L hip long-axis distraction in supine for axial decompression of LLE weightbearing joints Attempted inferior mobilization in 90 deg hip flexion (no belt) - stopped due to intolerance of direct pressure on proximal thigh STM along adductor magnus STM/IASTM with Theraband roller along rectus femoris and vastus lateralis, adductor longus and magnus L hip gentle adductor stretch; 3x30sec L hip posterior mobilization (axial load through femur) with conjunct passive ER/IR within pt tolerance L hip lateral distraction with Mulligan belt, intermittent L hip MWM (c belt distraction) with hip flexion       Therapeutic exercise - for improved strength as needed for ability to perform transferring, weightbearing activity, obstacle negotiation; for soft tissue mobility and ROM     Sidelying clamshell; 2x10, Green Tband   Minisquat, with table behind patient; 2x10, with 6-lb Med ball goblet hold   Patient education: HEP update and review     *not today* Patient education: discussed HEP modification for butterfly stretch, bent-knee fallout [employer], blue swiss ball; 3-way (forward,  L and R diagonal); x5 ea dir Lower trunk rotations; x10 alternating Sidelying hip abduction; 1x10 Supine hip abduction AROM; x20 Seated knee extension; 2x10, bilateral; 7.5-lb cuff weight Butterfly stretch; 3x30sec, hooklying Open book; x10 ea side SLR; 2x10 Glute bridge; 2x10 Butterfly stretch; 3x30sec, hooklying         Neuromuscular Re-education - for movement control and trunk stabilization, targeting cueing for gluteal activation     3-way Hip, standing with bilat UE support; x8 ea dir, bilat, with 3-lb cuff weight    Sidestep with Red Tband at distal shins; 3x down/back, length of // bars   Toe tap on 6-inch step; 2x10 alternating  Dynamic march; x5 D/B length of // bars, with mirror feedback to maintain upright posture and fully  shift weight to bilat LE     *not today* Hurdle stepping, 3 6-inch hurdles; 4x down/back length of // bars Tandem stance; 1x30 seconds, bilaterally  Tandem stance on foam; 1x30seconds, bilaterally Clamshell; 2x10, R sidelying [verbal cueing, tactile cueing, and demo for technique and to prevent trunk rotation compensation] Standing march; x10 alternating with unilateral table support  Pelvic tilt; 2x10, hooklying, anterior/posterior [minimal verbal and tactile cueing]         ASSESSMENT Patient has minimal pain at this time s/p prednisone taper and has fair response to bilateral LE distraction with Mulligan belt in supine. She reports no notable pain with gait following warm-up on bike and distraction work today. Increased emphasis on improving motor control with LLE weight-shifting and decreasing compensatory pattern (pt has been walking with antalgic pattern/dec stance time on LLE for >  1 year). Pt tolerates additional standing and weightshifting drills well today. She needs further work on hip strengthening. She is awaiting results of her MRI of the lumbar spine (will go over these with referring provider this Friday). Pt has remaining deficits in hip and LE strength, hip ROM, gait deviations, LLE weight shift/weight acceptance, and L iliolumbar/gluteal/groin pain. Patient will benefit from continued skilled PT intervention to address the above impairments and activity limitations for best return to PLOF.         PT Short Term Goals - 08/11/21 1647       PT SHORT TERM GOAL #1   Title Pt will be independent and 100% compliant with established HEP and activity modification as needed to augment PT intervention and improve pertinent strength and mobility deficits as needed for best return to prior level of function.    Baseline HEP provided at IE 06/15/21. 07/16/21: compliant and independent with exercise technique    Time 3    Period Weeks    Status Achieved    Target Date 07/06/21       PT SHORT TERM GOAL #2   Title Patient will perform independent sit to stand without UE support and no increase in pain indicative of improved functional LE strength and ability to perform transferring necessary for accessing home and community environment    Baseline IE: Significant difficulty with sit to stand with heavy UE support required. 07/16/21: performed with moderate L hip pain c weightbearing. 08/11/21: performed without increase in pain    Time 4    Period Weeks    Status Achieved    Target Date 07/30/21               PT Long Term Goals - 08/31/21 1114       PT LONG TERM GOAL #1   Title Patient will demonstrate improved function as evidenced by a score of 59 on FOTO measure for full participation in activities at home and in the community.    Baseline 06/15/21: 50. 07/15/21: 49. 08/12/21: 49. 08/27/21: 47    Time 8    Period Weeks    Status Not Met    Target Date 09/24/21      PT LONG TERM GOAL #2   Title Patient will perform consecutive hurdle step over 3 6-inch hurdles with good heel to toe progression, no LOB, no buckling off affected LE, and no increase in pain indicative of improved ability to weight shift to affected LE and clear lower limb over obstacles as needed for functional mobility    Baseline IE: Antalgic gait pattern wth decreased LLE weight shift. 07/15/21: able to perform dynamic march in // bars with sound weight shift. 07/28/21: performed in parallel bars with no LOB and no net increase in pain/sypmtoms    Time 8    Period Weeks    Status Achieved    Target Date 08/10/21      PT LONG TERM GOAL #3   Title Patient will improve MMT to 4+/5 for all tested LE musculature indicative of improved strength as needed for improved performance of transferring and as needed to produce power to prevent LOB during episode of large postural perturbation    Baseline IE: MMT 3+ to 4/5 for hip musculature (flexion, ER, IR, ABD), 4/5 for hamstrings and ankle dorsiflexors. 07/15/21:  Not met for ER/IR/ABD. otherwise 4+ or greater. 08/11/21: Not met for hip ER/IR/ABD. 08/27/21: Not met for hip ER/IR/ABD    Time 8  Period Weeks    Status Partially Met    Target Date 09/24/21      PT LONG TERM GOAL #4   Title Patient will ambulate x 300 feet in clinic with no AD with symmetrical weight shift and normal heel to toe progression without LOB and no reproduction of pain indicative of improved community-level gait    Baseline IE: Gait with antalgic pattern and decreased stance time LLE. 07/15/21: improving weight shift to LLE, though pt still demonstrates moderate antalgic pattern that is worse following prolonged sitting. 08/11/21: Intermittent improved weight shift, though antalgic pattern is exacerbated after period of sitting/immobility. 08/27/21: pt demonstrates good heel to toe progression and normal stride, but decreased L weight shift/antalgic pattern is present    Time 8    Period Weeks    Status Partially Met    Target Date 09/24/21                   Plan - 09/09/21 1416     Clinical Impression Statement Patient has minimal pain at this time s/p prednisone taper and has fair response to bilateral LE distraction with Mulligan belt in supine. She reports no notable pain with gait following warm-up on bike and distraction work today. Increased emphasis on improving motor control with LLE weight-shifting and decreasing compensatory pattern (pt has been walking with antalgic pattern/dec stance time on LLE for > 1 year). Pt tolerates additional standing and weightshifting drills well today. She needs further work on hip strengthening. She is awaiting results of her MRI of the lumbar spine (will go over these with referring provider this Friday). Pt has remaining deficits in hip and LE strength, hip ROM, gait deviations, LLE weight shift/weight acceptance, and L iliolumbar/gluteal/groin pain. Patient will benefit from continued skilled PT intervention to address the above  impairments and activity limitations for best return to PLOF.    Personal Factors and Comorbidities Age;Comorbidity 3+;Time since onset of injury/illness/exacerbation    Comorbidities CAD, acid reflux, s/p TIA    Examination-Activity Limitations Bed Mobility;Stairs;Stand;Dressing;Bathing;Bend;Locomotion Level;Squat    Examination-Participation Restrictions Community Activity;Cleaning;Laundry    Stability/Clinical Decision Making Evolving/Moderate complexity    Rehab Potential Good    PT Frequency 2x / week    PT Duration 8 weeks    PT Treatment/Interventions ADLs/Self Care Home Management    PT Next Visit Plan Increasing emphasis on progressing intensity of unloaded isotonics with progressive weightbearing and CKC strengthening as tolerated. MT to modulate symptoms and improve tolerance of active drills in clinic prn.    PT Home Exercise Plan Access Code [id number].             Patient will benefit from skilled therapeutic intervention in order to improve the following deficits and impairments:  Abnormal gait, Hypomobility, Difficulty walking, Decreased strength, Pain, Decreased range of motion  Visit Diagnosis: Pain in left hip  Difficulty in walking, not elsewhere classified  Muscle weakness (generalized)  Stiffness of left hip, not elsewhere classified     Problem List Patient Active Problem List   Diagnosis Date Noted   Unstable angina ([place]) 01/19/2017   CAD (coronary artery disease) 01/19/2017   Valentina Gu, PT, DPT #[id number]  Eilleen Kempf, PT 09/09/2021, 2:17 PM  [facility] [address] [place], [place], [address] Phone: [phone]   Fax:  [phone]  Name: Kristen Lloyd MRN: [id number] Date of Birth: Aug 01, 1941

## 2021-09-09 ENCOUNTER — Encounter: Payer: Self-pay | Admitting: Physical Therapy

## 2021-09-10 ENCOUNTER — Ambulatory Visit: Payer: Medicare Other | Admitting: Physical Therapy

## 2021-09-10 ENCOUNTER — Other Ambulatory Visit: Payer: Self-pay

## 2021-09-10 DIAGNOSIS — R262 Difficulty in walking, not elsewhere classified: Secondary | ICD-10-CM

## 2021-09-10 DIAGNOSIS — M25552 Pain in left hip: Secondary | ICD-10-CM

## 2021-09-10 DIAGNOSIS — M6281 Muscle weakness (generalized): Secondary | ICD-10-CM

## 2021-09-10 DIAGNOSIS — M25652 Stiffness of left hip, not elsewhere classified: Secondary | ICD-10-CM

## 2021-09-11 ENCOUNTER — Encounter: Payer: Self-pay | Admitting: Physical Therapy

## 2021-09-11 NOTE — Therapy (Signed)
[facility] [address] [place], [place], [address] Phone: [phone]   Fax:  [phone]  Physical Therapy Treatment  Patient Details  Name: Kristen Lloyd MRN: [id number] Date of Birth: 1941/08/31 Referring Provider (PT): Allene Dillon, FNP   Encounter Date: 09/10/2021   PT End of Session - 09/11/21 2204     Visit Number 23    Number of Visits 28    Date for PT Re-Evaluation 08/11/21    Authorization Time Period Cert 1/93-7/90, recert 2/40/97-35/32/99    Progress Note Due on Visit 28    PT Start Time 1556    PT Stop Time 1645    PT Time Calculation (min) 49 min    Activity Tolerance Patient tolerated treatment well;Patient limited by pain    Behavior During Therapy [facility] for tasks assessed/performed             Past Medical History:  Diagnosis Date   Acid reflux    Coronary artery disease    Diverticulitis    Stroke [facility])     Past Surgical History:  Procedure Laterality Date   ABDOMINAL HYSTERECTOMY     BREAST BIOPSY Left 1990's   lt bx x 2-neg   CARDIAC CATHETERIZATION N/A 01/19/2017   Procedure: Left Heart Cath and Coronary Angiography;  Surgeon: Teodoro Spray, MD;  Location: [place] [facility];  Service: Cardiovascular;  Laterality: N/A;   CARDIAC CATHETERIZATION N/A 01/19/2017   Procedure: Coronary Stent Intervention;  Surgeon: Isaias Cowman, MD;  Location: [place] [facility];  Service: Cardiovascular;  Laterality: N/A;   CARDIAC SURGERY     Ileostomy and reversal N/A 2021   TONSILLECTOMY      There were no vitals filed for this visit.   Subjective Assessment - 09/10/21 1553     Subjective Patient is awaiting MRI follow-up; pt is following up with [facility] tomorrow to discuss this. She reports minimal symptoms at arrival to PT, though she does "feel it" (experiencing mild symptoms) with stepping onto LLE with pain affecting L groin and L gluteal region. Pt reports compliance with HEP  including recent update.    Pertinent History Patient is an 80 year old female with primary complaint of L hip pain. Patient has referring diagnosis of L hip OA. Patient reports about 2 years of hip pain. She reports she was doing senior fitness program at [facility] at the time her symptoms began. She states that the staff noticed a change in her gait pattern and she felt minmal pain. This has since worsened. Patient states she was very sick last year and didn't do as much exercise at the time. Patient had ileostomy and reversal in 2021 with significant GI symptoms - significant recovery time required and patient had notably decreased physical activity. Patient did have steroid injection 05/19/2021 that did provide moderate relief. She feels that later in the day she is getting pain into thoracolumbar region from the effect her condition has on her walking. She states that during the day it "isn't that bad." Patient reports no night pain. Pain is worse in AM. Hx of TIA in 2015. No numbness or tingling. Patient stated goals: to avoid surgery and to walk better.    Limitations Standing;Walking;House hold activities    How long can you stand comfortably? 10-15 minutes    How long can you walk comfortably? 10-15 minutes    Diagnostic tests L hip radiographs; evidence of OA/degenerative change    Patient Stated Goals  To avoid surgery, able to walk better               Treatment Performed     NuStep x 5 minutes, Level 3; seat 5, arms 7 - unbilled time       TREATMENT     Manual Therapy - for joint and soft tissue mobility, symptom modulation, hip ROM   L hip PROM within pt tolerance   Bilateral lower extremity distraction in supine with Mulligan belt, intermittent   STM and IASTM with [employer] along bilat L3-L5 erector spinae (emphasis on L side) and L gluteal mm in R sidelying       *not today*  L hip long-axis distraction in supine for axial decompression of LLE weightbearing  joints Attempted inferior mobilization in 90 deg hip flexion (no belt) - stopped due to intolerance of direct pressure on proximal thigh STM along adductor magnus STM/IASTM with Theraband roller along rectus femoris and vastus lateralis, adductor longus and magnus L hip gentle adductor stretch; 3x30sec L hip posterior mobilization (axial load through femur) with conjunct passive ER/IR within pt tolerance L hip lateral distraction with Mulligan belt, intermittent L hip MWM (c belt distraction) with hip flexion        Therapeutic exercise - for improved strength as needed for ability to perform transferring, weightbearing activity, obstacle negotiation; for soft tissue mobility and ROM     Sidelying clamshell; 2x10, Green Tband   Minisquat, with table behind patient; 2x10, with 6-lb Med ball goblet hold     *not today* Patient education: discussed HEP modification for butterfly stretch, bent-knee fallout [employer], blue swiss ball; 3-way (forward,  L and R diagonal); x5 ea dir Lower trunk rotations; x10 alternating Sidelying hip abduction; 1x10 Supine hip abduction AROM; x20 Seated knee extension; 2x10, bilateral; 7.5-lb cuff weight Butterfly stretch; 3x30sec, hooklying Open book; x10 ea side SLR; 2x10 Glute bridge; 2x10 Butterfly stretch; 3x30sec, hooklying         Neuromuscular Re-education - for movement control and trunk stabilization, targeting cueing for gluteal activation     Sidestep with Red Tband at distal shins; 3x down/back, length of // bars   Toe tap on 6-inch step; 2x10 alternating   Dynamic march; x5 D/B length of // bars, with mirror feedback to maintain upright posture and fully shift weight to bilat LE    *next visit* 3-way Hip, standing with bilat UE support; x8 ea dir, bilat, with 3-lb cuff weight       *not today* Hurdle stepping, 3 6-inch hurdles; 4x down/back length of // bars Tandem stance; 1x30 seconds, bilaterally  Tandem stance  on foam; 1x30seconds, bilaterally Clamshell; 2x10, R sidelying [verbal cueing, tactile cueing, and demo for technique and to prevent trunk rotation compensation] Standing march; x10 alternating with unilateral table support  Pelvic tilt; 2x10, hooklying, anterior/posterior [minimal verbal and tactile cueing]         ASSESSMENT Patient has minimal pain at this time with her recent prednisone taper. She has fair respond to use of LE long-leg distraction. She presents with ongoing antalgic gait with decrease stance time on LLE and difficulty with prolonged walking/standing. She has no notable symptoms with lying/sitting. Continued work on LLE weight shifting and gluteal strengthening today without notable increase in pain. Pt is following up with referring provider for MRI results tomorrow. Pt has remaining deficits in hip and LE strength, hip ROM, gait deviations, LLE weight shift/weight acceptance, and L iliolumbar/gluteal/groin pain. Patient will benefit from continued  skilled PT intervention to address the above impairments and activity limitations for best return to PLOF.         PT Short Term Goals - 08/11/21 1647       PT SHORT TERM GOAL #1   Title Pt will be independent and 100% compliant with established HEP and activity modification as needed to augment PT intervention and improve pertinent strength and mobility deficits as needed for best return to prior level of function.    Baseline HEP provided at IE 06/15/21. 07/16/21: compliant and independent with exercise technique    Time 3    Period Weeks    Status Achieved    Target Date 07/06/21      PT SHORT TERM GOAL #2   Title Patient will perform independent sit to stand without UE support and no increase in pain indicative of improved functional LE strength and ability to perform transferring necessary for accessing home and community environment    Baseline IE: Significant difficulty with sit to stand with heavy UE support required.  07/16/21: performed with moderate L hip pain c weightbearing. 08/11/21: performed without increase in pain    Time 4    Period Weeks    Status Achieved    Target Date 07/30/21               PT Long Term Goals - 08/31/21 1114       PT LONG TERM GOAL #1   Title Patient will demonstrate improved function as evidenced by a score of 59 on FOTO measure for full participation in activities at home and in the community.    Baseline 06/15/21: 50. 07/15/21: 49. 08/12/21: 49. 08/27/21: 47    Time 8    Period Weeks    Status Not Met    Target Date 09/24/21      PT LONG TERM GOAL #2   Title Patient will perform consecutive hurdle step over 3 6-inch hurdles with good heel to toe progression, no LOB, no buckling off affected LE, and no increase in pain indicative of improved ability to weight shift to affected LE and clear lower limb over obstacles as needed for functional mobility    Baseline IE: Antalgic gait pattern wth decreased LLE weight shift. 07/15/21: able to perform dynamic march in // bars with sound weight shift. 07/28/21: performed in parallel bars with no LOB and no net increase in pain/sypmtoms    Time 8    Period Weeks    Status Achieved    Target Date 08/10/21      PT LONG TERM GOAL #3   Title Patient will improve MMT to 4+/5 for all tested LE musculature indicative of improved strength as needed for improved performance of transferring and as needed to produce power to prevent LOB during episode of large postural perturbation    Baseline IE: MMT 3+ to 4/5 for hip musculature (flexion, ER, IR, ABD), 4/5 for hamstrings and ankle dorsiflexors. 07/15/21: Not met for ER/IR/ABD. otherwise 4+ or greater. 08/11/21: Not met for hip ER/IR/ABD. 08/27/21: Not met for hip ER/IR/ABD    Time 8    Period Weeks    Status Partially Met    Target Date 09/24/21      PT LONG TERM GOAL #4   Title Patient will ambulate x 300 feet in clinic with no AD with symmetrical weight shift and normal heel to toe  progression without LOB and no reproduction of pain indicative of improved community-level gait  Baseline IE: Gait with antalgic pattern and decreased stance time LLE. 07/15/21: improving weight shift to LLE, though pt still demonstrates moderate antalgic pattern that is worse following prolonged sitting. 08/11/21: Intermittent improved weight shift, though antalgic pattern is exacerbated after period of sitting/immobility. 08/27/21: pt demonstrates good heel to toe progression and normal stride, but decreased L weight shift/antalgic pattern is present    Time 8    Period Weeks    Status Partially Met    Target Date 09/24/21                   Plan - 09/11/21 2210     Clinical Impression Statement Patient has minimal pain at this time with her recent prednisone taper. She has fair respond to use of LE long-leg distraction. She presents with ongoing antalgic gait with decrease stance time on LLE and difficulty with prolonged walking/standing. She has no notable symptoms with lying/sitting. Continued work on LLE weight shifting and gluteal strengthening today without notable increase in pain. Pt is following up with referring provider for MRI results tomorrow. Pt has remaining deficits in hip and LE strength, hip ROM, gait deviations, LLE weight shift/weight acceptance, and L iliolumbar/gluteal/groin pain. Patient will benefit from continued skilled PT intervention to address the above impairments and activity limitations for best return to PLOF.    Personal Factors and Comorbidities Age;Comorbidity 3+;Time since onset of injury/illness/exacerbation    Comorbidities CAD, acid reflux, s/p TIA    Examination-Activity Limitations Bed Mobility;Stairs;Stand;Dressing;Bathing;Bend;Locomotion Level;Squat    Examination-Participation Restrictions Community Activity;Cleaning;Laundry    Stability/Clinical Decision Making Evolving/Moderate complexity    Rehab Potential Good    PT Frequency 2x / week    PT  Duration 8 weeks    PT Treatment/Interventions ADLs/Self Care Home Management    PT Next Visit Plan Increasing emphasis on progressing intensity of unloaded isotonics with progressive weightbearing and CKC strengthening as tolerated. MT to modulate symptoms and improve tolerance of active drills in clinic prn.    PT Home Exercise Plan Access Code [id number].             Patient will benefit from skilled therapeutic intervention in order to improve the following deficits and impairments:  Abnormal gait, Hypomobility, Difficulty walking, Decreased strength, Pain, Decreased range of motion  Visit Diagnosis: Pain in left hip  Difficulty in walking, not elsewhere classified  Muscle weakness (generalized)  Stiffness of left hip, not elsewhere classified     Problem List Patient Active Problem List   Diagnosis Date Noted   Unstable angina ([place]) 01/19/2017   CAD (coronary artery disease) 01/19/2017   Valentina Gu, PT, DPT #[id number]  Eilleen Kempf, PT 09/11/2021, 10:10 PM  [facility] [address] [place], [place], [address] Phone: [phone]   Fax:  [phone]  Name: Kristen Lloyd MRN: [id number] Date of Birth: 01-12-1941

## 2021-09-15 ENCOUNTER — Ambulatory Visit: Payer: Medicare Other | Admitting: Physical Therapy

## 2021-09-15 ENCOUNTER — Other Ambulatory Visit: Payer: Self-pay

## 2021-09-15 ENCOUNTER — Encounter: Payer: Self-pay | Admitting: Physical Therapy

## 2021-09-15 DIAGNOSIS — M6281 Muscle weakness (generalized): Secondary | ICD-10-CM

## 2021-09-15 DIAGNOSIS — M25552 Pain in left hip: Secondary | ICD-10-CM | POA: Diagnosis not present

## 2021-09-15 DIAGNOSIS — R262 Difficulty in walking, not elsewhere classified: Secondary | ICD-10-CM

## 2021-09-15 DIAGNOSIS — M25652 Stiffness of left hip, not elsewhere classified: Secondary | ICD-10-CM

## 2021-09-15 NOTE — Therapy (Signed)
[facility] [address]. [place], [place], [address] Phone: [phone]   Fax:  [phone]  Physical Therapy Treatment  Patient Details  Name: Kristen Lloyd MRN: [id number] Date of Birth: [birth year] Referring Provider (PT): Allene Dillon, FNP   Encounter Date: 09/15/2021   PT End of Session - 09/16/21 7902     Visit Number 24    Number of Visits 28    Date for PT Re-Evaluation 08/11/21    Authorization Time Period Cert 4/09-7/35, recert 03/25/91-42/68/34    Progress Note Due on Visit 28    PT Start Time 1601    PT Stop Time 1962    PT Time Calculation (min) 46 min    Activity Tolerance Patient tolerated treatment well;Patient limited by pain    Behavior During Therapy [facility] for tasks assessed/performed             Past Medical History:  Diagnosis Date   Acid reflux    Coronary artery disease    Diverticulitis    Stroke [facility])     Past Surgical History:  Procedure Laterality Date   ABDOMINAL HYSTERECTOMY     BREAST BIOPSY Left 1990's   lt bx x 2-neg   CARDIAC CATHETERIZATION N/A 01/19/2017   Procedure: Left Heart Cath and Coronary Angiography;  Surgeon: Teodoro Spray, MD;  Location: [place] [facility];  Service: Cardiovascular;  Laterality: N/A;   CARDIAC CATHETERIZATION N/A 01/19/2017   Procedure: Coronary Stent Intervention;  Surgeon: Isaias Cowman, MD;  Location: [place] [facility];  Service: Cardiovascular;  Laterality: N/A;   CARDIAC SURGERY     Ileostomy and reversal N/A 2021   TONSILLECTOMY      There were no vitals filed for this visit.   Subjective Assessment - 09/15/21 1603     Subjective Patient states she feels like she is "dragging" at start of session. She states she attended a graveside funeral this morning. She reported feeling okay with the longer distance walk across the cemetary through the grass. She reports minimal symptoms at arrival to PT. Pt reports compliance with HEP.     Pertinent History Patient is an 80 year old female with primary complaint of L hip pain. Patient has referring diagnosis of L hip OA. Patient reports about 2 years of hip pain. She reports she was doing senior fitness program at [facility] [employer] at the time her symptoms began. She states that the staff noticed a change in her gait pattern and she felt minmal pain. This has since worsened. Patient states she was very sick last year and didn't do as much exercise at the time. Patient had ileostomy and reversal in 2021 with significant GI symptoms - significant recovery time required and patient had notably decreased physical activity. Patient did have steroid injection 05/19/2021 that did provide moderate relief. She feels that later in the day she is getting pain into thoracolumbar region from the effect her condition has on her walking. She states that during the day it "isn't that bad." Patient reports no night pain. Pain is worse in AM. Hx of TIA in 2015. No numbness or tingling. Patient stated goals: to avoid surgery and to walk better.    Limitations Standing;Walking;House hold activities    How long can you stand comfortably? 10-15 minutes    How long can you walk comfortably? 10-15 minutes    Diagnostic tests L hip radiographs; evidence of OA/degenerative change    Patient Stated Goals To avoid surgery, able  to walk better    Currently in Pain? Yes    Pain Score 5     Pain Location Hip                  Treatment Performed     NuStep x 5 minutes, Level 3-4; seat 5, arms 7 - unbilled time     TREATMENT     Manual Therapy - for joint and soft tissue mobility, symptom modulation, hip ROM   L hip PROM within pt tolerance   Bilateral lower extremity long axis distraction in supine, intermittent   STM and IASTM with Hypervolt along bilat L3-L5 erector spinae (emphasis on L side) and L gluteal mm in R sidelying         *not today*  L hip long-axis distraction in supine for  axial decompression of LLE weightbearing joints Attempted inferior mobilization in 90 deg hip flexion (no belt) - stopped due to intolerance of direct pressure on proximal thigh STM along adductor magnus STM/IASTM with Theraband roller along rectus femoris and vastus lateralis, adductor longus and magnus L hip gentle adductor stretch; 3x30sec L hip posterior mobilization (axial load through femur) with conjunct passive ER/IR within pt tolerance L hip lateral distraction with Mulligan belt, intermittent L hip MWM (c belt distraction) with hip flexion        Therapeutic exercise - for improved strength as needed for ability to perform transferring, weightbearing activity, obstacle negotiation; for soft tissue mobility and ROM    Sidelying clamshell; 2x10, Green Tband   Minisquat, with table behind patient; 2x10, with 6-lb Med ball goblet hold   3-way Hip, standing with bilat UE support; x8 ea dir, bilat, with 3-lb cuff weight     *not today* Patient education: discussed HEP modification for butterfly stretch, bent-knee fallout [employer], blue swiss ball; 3-way (forward,  L and R diagonal); x5 ea dir Lower trunk rotations; x10 alternating Sidelying hip abduction; 1x10 Supine hip abduction AROM; x20 Seated knee extension; 2x10, bilateral; 7.5-lb cuff weight Butterfly stretch; 3x30sec, hooklying Open book; x10 ea side SLR; 2x10 Glute bridge; 2x10 Butterfly stretch; 3x30sec, hooklying         Neuromuscular Re-education - for movement control and trunk stabilization, targeting cueing for gluteal activation     Sidestep with Red Tband at distal shins; 3x down/back, length of // bars   Toe tap on 6-inch step; 2x10 alternating   Dynamic march; x5 D/B length of // bars, with mirror feedback to maintain upright posture and fully shift weight to bilat LE         *not today* Hurdle stepping, 3 6-inch hurdles; 4x down/back length of // bars Tandem stance; 1x30 seconds,  bilaterally  Tandem stance on foam; 1x30seconds, bilaterally Clamshell; 2x10, R sidelying [verbal cueing, tactile cueing, and demo for technique and to prevent trunk rotation compensation] Standing march; x10 alternating with unilateral table support  Pelvic tilt; 2x10, hooklying, anterior/posterior [minimal verbal and tactile cueing]         ASSESSMENT Although fatigued upon arrival, patient demonstrates excellent motivation throughout PT session. Seated rest breaks provided as needed. She presents with ongoing antalgic gait with decrease stance time on LLE and difficulty with prolonged walking/standing. Interventions focused on gluteal strengthening and bilateral weight shifting, especially onto the LLE. Pt has remaining deficits in hip and LE strength, hip ROM, gait deviations, LLE weight shift/weight acceptance, and L iliolumbar/gluteal/groin pain. Patient will benefit from continued skilled PT intervention to address the above impairments and  activity limitations for best return to PLOF.               PT Short Term Goals - 08/11/21 1647       PT SHORT TERM GOAL #1   Title Pt will be independent and 100% compliant with established HEP and activity modification as needed to augment PT intervention and improve pertinent strength and mobility deficits as needed for best return to prior level of function.    Baseline HEP provided at IE 06/15/21. 07/16/21: compliant and independent with exercise technique    Time 3    Period Weeks    Status Achieved    Target Date 07/06/21      PT SHORT TERM GOAL #2   Title Patient will perform independent sit to stand without UE support and no increase in pain indicative of improved functional LE strength and ability to perform transferring necessary for accessing home and community environment    Baseline IE: Significant difficulty with sit to stand with heavy UE support required. 07/16/21: performed with moderate L hip pain c weightbearing.  08/11/21: performed without increase in pain    Time 4    Period Weeks    Status Achieved    Target Date 07/30/21               PT Long Term Goals - 08/31/21 1114       PT LONG TERM GOAL #1   Title Patient will demonstrate improved function as evidenced by a score of 59 on FOTO measure for full participation in activities at home and in the community.    Baseline 06/15/21: 50. 07/15/21: 49. 08/12/21: 49. 08/27/21: 47    Time 8    Period Weeks    Status Not Met    Target Date 09/24/21      PT LONG TERM GOAL #2   Title Patient will perform consecutive hurdle step over 3 6-inch hurdles with good heel to toe progression, no LOB, no buckling off affected LE, and no increase in pain indicative of improved ability to weight shift to affected LE and clear lower limb over obstacles as needed for functional mobility    Baseline IE: Antalgic gait pattern wth decreased LLE weight shift. 07/15/21: able to perform dynamic march in // bars with sound weight shift. 07/28/21: performed in parallel bars with no LOB and no net increase in pain/sypmtoms    Time 8    Period Weeks    Status Achieved    Target Date 08/10/21      PT LONG TERM GOAL #3   Title Patient will improve MMT to 4+/5 for all tested LE musculature indicative of improved strength as needed for improved performance of transferring and as needed to produce power to prevent LOB during episode of large postural perturbation    Baseline IE: MMT 3+ to 4/5 for hip musculature (flexion, ER, IR, ABD), 4/5 for hamstrings and ankle dorsiflexors. 07/15/21: Not met for ER/IR/ABD. otherwise 4+ or greater. 08/11/21: Not met for hip ER/IR/ABD. 08/27/21: Not met for hip ER/IR/ABD    Time 8    Period Weeks    Status Partially Met    Target Date 09/24/21      PT LONG TERM GOAL #4   Title Patient will ambulate x 300 feet in clinic with no AD with symmetrical weight shift and normal heel to toe progression without LOB and no reproduction of pain indicative of  improved community-level gait    Baseline IE: Gait  with antalgic pattern and decreased stance time LLE. 07/15/21: improving weight shift to LLE, though pt still demonstrates moderate antalgic pattern that is worse following prolonged sitting. 08/11/21: Intermittent improved weight shift, though antalgic pattern is exacerbated after period of sitting/immobility. 08/27/21: pt demonstrates good heel to toe progression and normal stride, but decreased L weight shift/antalgic pattern is present    Time 8    Period Weeks    Status Partially Met    Target Date 09/24/21                   Plan - 09/16/21 0813     Clinical Impression Statement Although fatigued upon arrival, patient demonstrates excellent motivation throughout PT session. Seated rest breaks provided as needed. She presents with ongoing antalgic gait with decrease stance time on LLE and difficulty with prolonged walking/standing. Interventions focused on gluteal strengthening and bilateral weight shifting, especially onto the LLE. Pt has remaining deficits in hip and LE strength, hip ROM, gait deviations, LLE weight shift/weight acceptance, and L iliolumbar/gluteal/groin pain. Patient will benefit from continued skilled PT intervention to address the above impairments and activity limitations for best return to PLOF.    Personal Factors and Comorbidities Age;Comorbidity 3+;Time since onset of injury/illness/exacerbation    Comorbidities CAD, acid reflux, s/p TIA    Examination-Activity Limitations Bed Mobility;Stairs;Stand;Dressing;Bathing;Bend;Locomotion Level;Squat    Examination-Participation Restrictions Community Activity;Cleaning;Laundry    Stability/Clinical Decision Making Evolving/Moderate complexity    Rehab Potential Good    PT Frequency 2x / week    PT Duration 8 weeks    PT Treatment/Interventions ADLs/Self Care Home Management    PT Next Visit Plan Increasing emphasis on progressing intensity of unloaded isotonics with  progressive weightbearing and CKC strengthening as tolerated. MT to modulate symptoms and improve tolerance of active drills in clinic prn.    PT Home Exercise Plan Access Code [phone].             Patient will benefit from skilled therapeutic intervention in order to improve the following deficits and impairments:  Abnormal gait, Hypomobility, Difficulty walking, Decreased strength, Pain, Decreased range of motion  Visit Diagnosis: Pain in left hip  Difficulty in walking, not elsewhere classified  Muscle weakness (generalized)  Stiffness of left hip, not elsewhere classified     Problem List Patient Active Problem List   Diagnosis Date Noted   Unstable angina ([place]) 01/19/2017   CAD (coronary artery disease) 01/19/2017    Patrina Levering PT, DPT  Ramonita Lab, PT 09/16/2021, 8:23 AM  [facility] [address] [place], [place], [address] Phone: [phone]   Fax:  [phone]  Name: Kristen Lloyd MRN: [id number] Date of Birth: December 27, 1941

## 2021-09-17 ENCOUNTER — Ambulatory Visit: Payer: Medicare Other

## 2021-09-17 ENCOUNTER — Other Ambulatory Visit: Payer: Self-pay

## 2021-09-17 DIAGNOSIS — M6281 Muscle weakness (generalized): Secondary | ICD-10-CM

## 2021-09-17 DIAGNOSIS — R262 Difficulty in walking, not elsewhere classified: Secondary | ICD-10-CM

## 2021-09-17 DIAGNOSIS — M25552 Pain in left hip: Secondary | ICD-10-CM | POA: Diagnosis not present

## 2021-09-17 DIAGNOSIS — M25652 Stiffness of left hip, not elsewhere classified: Secondary | ICD-10-CM

## 2021-09-17 NOTE — Therapy (Signed)
[facility] [address] [place], [place], [address] Phone: [phone]   Fax:  [phone]  Physical Therapy Treatment  Patient Details  Name: Kristen Lloyd MRN: [id number] Date of Birth: Nov 01, 1941 Referring Provider (PT): Allene Dillon, FNP   Encounter Date: 09/17/2021   PT End of Session - 09/17/21 1705     Visit Number 25    Number of Visits 28    Date for PT Re-Evaluation 08/11/21    Authorization Time Period Cert 7/49-4/49, recert 6/75/91-63/84/66    Progress Note Due on Visit 28    PT Start Time 1620    PT Stop Time 1705    PT Time Calculation (min) 45 min    Activity Tolerance Patient tolerated treatment well;Patient limited by pain    Behavior During Therapy [facility] for tasks assessed/performed             Past Medical History:  Diagnosis Date   Acid reflux    Coronary artery disease    Diverticulitis    Stroke [facility])     Past Surgical History:  Procedure Laterality Date   ABDOMINAL HYSTERECTOMY     BREAST BIOPSY Left 1990's   lt bx x 2-neg   CARDIAC CATHETERIZATION N/A 01/19/2017   Procedure: Left Heart Cath and Coronary Angiography;  Surgeon: Teodoro Spray, MD;  Location: [place] [facility];  Service: Cardiovascular;  Laterality: N/A;   CARDIAC CATHETERIZATION N/A 01/19/2017   Procedure: Coronary Stent Intervention;  Surgeon: Isaias Cowman, MD;  Location: [place] [facility];  Service: Cardiovascular;  Laterality: N/A;   CARDIAC SURGERY     Ileostomy and reversal N/A 2021   TONSILLECTOMY      There were no vitals filed for this visit.   Subjective Assessment - 09/17/21 1704     Subjective Pt reports her L hip is a little more sore than usual this afternoon.  She did a lot of grocery shopping right before coming to PT and she thinks this is why it is hurting more today.    Pertinent History Patient is an 80 year old female with primary complaint of L hip pain. Patient has referring diagnosis  of L hip OA. Patient reports about 2 years of hip pain. She reports she was doing senior fitness program at [facility] at the time her symptoms began. She states that the staff noticed a change in her gait pattern and she felt minmal pain. This has since worsened. Patient states she was very sick last year and didn't do as much exercise at the time. Patient had ileostomy and reversal in 2021 with significant GI symptoms - significant recovery time required and patient had notably decreased physical activity. Patient did have steroid injection 05/19/2021 that did provide moderate relief. She feels that later in the day she is getting pain into thoracolumbar region from the effect her condition has on her walking. She states that during the day it "isn't that bad." Patient reports no night pain. Pain is worse in AM. Hx of TIA in 2015. No numbness or tingling. Patient stated goals: to avoid surgery and to walk better.    Limitations Standing;Walking;House hold activities    How long can you stand comfortably? 10-15 minutes    How long can you walk comfortably? 10-15 minutes    Diagnostic tests L hip radiographs; evidence of OA/degenerative change    Patient Stated Goals To avoid surgery, able to walk better  Treatment Performed     NuStep x 5 minutes, Level 3-4; seat 5, arms 7 - unbilled time     TREATMENT     Manual Therapy - for joint and soft tissue mobility, symptom modulation, hip ROM   L hip PROM within pt tolerance (flexion, ER, Abd PROM directions)   Bilateral lower extremity long axis distraction in supine, intermittent   STM L rectus remoris, L adductor group  L hip long-axis distraction in supine for axial decompression of LLE weightbearing joints: 20 sec intervals, x10             Therapeutic exercise - for improved strength as needed for ability to perform transferring, weightbearing activity, obstacle negotiation; for soft tissue mobility and  ROM  Lower trunk rotations; x10 alternating Butterfly stretch; 3x30sec, hooklying Supine hooklying clamshell; 2x10, Green Tband (to promote posterior lateral hip mm activation)  3-way Hip, standing with bilat UE support; x8 ea dir, bilat, with 3-lb cuff weight  ( in parallel bars with b/l UE support)       *not today* Patient education: discussed HEP modification for butterfly stretch, bent-knee fallout [employer], blue swiss ball; 3-way (forward,  L and R diagonal); x5 ea dir Sidelying hip abduction; 1x10 Supine hip abduction AROM; x20 Seated knee extension; 2x10, bilateral; 7.5-lb cuff weight Butterfly stretch; 3x30sec, hooklying Open book; x10 ea side SLR; 2x10 Glute bridge; 2x10            ASSESSMENT See clinical impression statement         PT Short Term Goals - 08/11/21 1647       PT SHORT TERM GOAL #1   Title Pt will be independent and 100% compliant with established HEP and activity modification as needed to augment PT intervention and improve pertinent strength and mobility deficits as needed for best return to prior level of function.    Baseline HEP provided at IE 06/15/21. 07/16/21: compliant and independent with exercise technique    Time 3    Period Weeks    Status Achieved    Target Date 07/06/21      PT SHORT TERM GOAL #2   Title Patient will perform independent sit to stand without UE support and no increase in pain indicative of improved functional LE strength and ability to perform transferring necessary for accessing home and community environment    Baseline IE: Significant difficulty with sit to stand with heavy UE support required. 07/16/21: performed with moderate L hip pain c weightbearing. 08/11/21: performed without increase in pain    Time 4    Period Weeks    Status Achieved    Target Date 07/30/21               PT Long Term Goals - 08/31/21 1114       PT LONG TERM GOAL #1   Title Patient will demonstrate improved  function as evidenced by a score of 59 on FOTO measure for full participation in activities at home and in the community.    Baseline 06/15/21: 50. 07/15/21: 49. 08/12/21: 49. 08/27/21: 47    Time 8    Period Weeks    Status Not Met    Target Date 09/24/21      PT LONG TERM GOAL #2   Title Patient will perform consecutive hurdle step over 3 6-inch hurdles with good heel to toe progression, no LOB, no buckling off affected LE, and no increase in pain indicative of improved ability to weight  shift to affected LE and clear lower limb over obstacles as needed for functional mobility    Baseline IE: Antalgic gait pattern wth decreased LLE weight shift. 07/15/21: able to perform dynamic march in // bars with sound weight shift. 07/28/21: performed in parallel bars with no LOB and no net increase in pain/sypmtoms    Time 8    Period Weeks    Status Achieved    Target Date 08/10/21      PT LONG TERM GOAL #3   Title Patient will improve MMT to 4+/5 for all tested LE musculature indicative of improved strength as needed for improved performance of transferring and as needed to produce power to prevent LOB during episode of large postural perturbation    Baseline IE: MMT 3+ to 4/5 for hip musculature (flexion, ER, IR, ABD), 4/5 for hamstrings and ankle dorsiflexors. 07/15/21: Not met for ER/IR/ABD. otherwise 4+ or greater. 08/11/21: Not met for hip ER/IR/ABD. 08/27/21: Not met for hip ER/IR/ABD    Time 8    Period Weeks    Status Partially Met    Target Date 09/24/21      PT LONG TERM GOAL #4   Title Patient will ambulate x 300 feet in clinic with no AD with symmetrical weight shift and normal heel to toe progression without LOB and no reproduction of pain indicative of improved community-level gait    Baseline IE: Gait with antalgic pattern and decreased stance time LLE. 07/15/21: improving weight shift to LLE, though pt still demonstrates moderate antalgic pattern that is worse following prolonged sitting.  08/11/21: Intermittent improved weight shift, though antalgic pattern is exacerbated after period of sitting/immobility. 08/27/21: pt demonstrates good heel to toe progression and normal stride, but decreased L weight shift/antalgic pattern is present    Time 8    Period Weeks    Status Partially Met    Target Date 09/24/21                   Plan - 09/17/21 1709     Clinical Impression Statement Pt arrived today with c/o increased L hip pain compared to the past few days.  PT focused tx on manual therapy for sx modulation, and modified exercises.  Palpable trigger points noted in adductor mm group and rectus femoris on L LE.  L gluteal mm weakness is still observed in supine, sitting, and standing exercises.  She was able to amb with decreased antalgic gait at end of session.    Personal Factors and Comorbidities Age;Comorbidity 3+;Time since onset of injury/illness/exacerbation    Comorbidities CAD, acid reflux, s/p TIA    Examination-Activity Limitations Bed Mobility;Stairs;Stand;Dressing;Bathing;Bend;Locomotion Level;Squat    Examination-Participation Restrictions Community Activity;Cleaning;Laundry    Stability/Clinical Decision Making Evolving/Moderate complexity    Rehab Potential Good    PT Frequency 2x / week    PT Duration 8 weeks    PT Treatment/Interventions ADLs/Self Care Home Management    PT Next Visit Plan Increasing emphasis on progressing intensity of unloaded isotonics with progressive weightbearing and CKC strengthening as tolerated. MT to modulate symptoms and improve tolerance of active drills in clinic prn.    PT Home Exercise Plan Access Code [phone].             Patient will benefit from skilled therapeutic intervention in order to improve the following deficits and impairments:  Abnormal gait, Hypomobility, Difficulty walking, Decreased strength, Pain, Decreased range of motion  Visit Diagnosis: Pain in left hip  Difficulty in walking, not  elsewhere  classified  Muscle weakness (generalized)  Stiffness of left hip, not elsewhere classified     Problem List Patient Active Problem List   Diagnosis Date Noted   Unstable angina ([place]) 01/19/2017   CAD (coronary artery disease) 01/19/2017    Pincus Badder, PT 09/17/2021, 5:26 PM Merdis Delay, PT, DPT Physical Therapist - [place]   [facility] [address] [place], [place], [address] Phone: [phone]   Fax:  [phone]  Name: Kristen Lloyd MRN: [id number] Date of Birth: 04-Aug-1941

## 2021-09-22 ENCOUNTER — Other Ambulatory Visit: Payer: Self-pay

## 2021-09-22 ENCOUNTER — Ambulatory Visit: Payer: Medicare Other | Admitting: Physical Therapy

## 2021-09-22 DIAGNOSIS — M25552 Pain in left hip: Secondary | ICD-10-CM | POA: Diagnosis not present

## 2021-09-22 DIAGNOSIS — M25652 Stiffness of left hip, not elsewhere classified: Secondary | ICD-10-CM

## 2021-09-22 DIAGNOSIS — M6281 Muscle weakness (generalized): Secondary | ICD-10-CM

## 2021-09-22 DIAGNOSIS — R262 Difficulty in walking, not elsewhere classified: Secondary | ICD-10-CM

## 2021-09-22 NOTE — Therapy (Signed)
[facility] [address] [place], [place], [address] Phone: [phone]   Fax:  [phone]  Physical Therapy Treatment  Patient Details  Name: Kristen Lloyd MRN: [id number] Date of Birth: [birth year] Referring Provider (PT): Allene Dillon, FNP   Encounter Date: 09/22/2021   PT End of Session - 09/24/21 0832     Visit Number 26    Number of Visits 28    Date for PT Re-Evaluation 08/11/21    Authorization Time Period Cert 0/35-0/09, recert 3/81/82-99/37/16    Progress Note Due on Visit 28    PT Start Time 1546    PT Stop Time 1635    PT Time Calculation (min) 49 min    Activity Tolerance Patient tolerated treatment well;Patient limited by pain    Behavior During Therapy [facility] for tasks assessed/performed             Past Medical History:  Diagnosis Date   Acid reflux    Coronary artery disease    Diverticulitis    Stroke [facility])     Past Surgical History:  Procedure Laterality Date   ABDOMINAL HYSTERECTOMY     BREAST BIOPSY Left 1990's   lt bx x 2-neg   CARDIAC CATHETERIZATION N/A 01/19/2017   Procedure: Left Heart Cath and Coronary Angiography;  Surgeon: Teodoro Spray, MD;  Location: [place] CV LAB;  Service: Cardiovascular;  Laterality: N/A;   CARDIAC CATHETERIZATION N/A 01/19/2017   Procedure: Coronary Stent Intervention;  Surgeon: Isaias Cowman, MD;  Location: [place] CV LAB;  Service: Cardiovascular;  Laterality: N/A;   CARDIAC SURGERY     Ileostomy and reversal N/A 2021   TONSILLECTOMY      There were no vitals filed for this visit.   Subjective Assessment - 09/24/21 0835     Subjective Patient reports feeling that she has been "dragging" recently; she reports being more active with home repairs and recent graveside funeral service. She reports doing well after her last session. She reports aching affecting L lateral leg and her lateral L foot. Pt denies numbness/tingling. Patient reports  she feels this mainly with walking. She has had more L knee pain recently.    Pertinent History Patient is an 80 year old female with primary complaint of L hip pain. Patient has referring diagnosis of L hip OA. Patient reports about 2 years of hip pain. She reports she was doing senior fitness program at [facility] at the time her symptoms began. She states that the staff noticed a change in her gait pattern and she felt minmal pain. This has since worsened. Patient states she was very sick last year and didn't do as much exercise at the time. Patient had ileostomy and reversal in 2021 with significant GI symptoms - significant recovery time required and patient had notably decreased physical activity. Patient did have steroid injection 05/19/2021 that did provide moderate relief. She feels that later in the day she is getting pain into thoracolumbar region from the effect her condition has on her walking. She states that during the day it "isn't that bad." Patient reports no night pain. Pain is worse in AM. Hx of TIA in 2015. No numbness or tingling. Patient stated goals: to avoid surgery and to walk better.    Limitations Standing;Walking;House hold activities    How long can you stand comfortably? 10-15 minutes    How long can you walk comfortably? 10-15 minutes    Diagnostic tests L hip radiographs; evidence of  OA/degenerative change    Patient Stated Goals To avoid surgery, able to walk better               Treatment Performed     NuStep x 5 minutes, Level 4; seat 6, arms 7 - unbilled time     TREATMENT     Manual Therapy - for joint and soft tissue mobility, symptom modulation, hip ROM   L hip PROM within pt tolerance   Bilateral lower extremity long axis distraction in supine, intermittent [aggravating after 4 min, D/C]   STM and IASTM with Hypervolt along bilat L3-L5 erector spinae (emphasis on L side) and L gluteal mm in R sidelying     STM/IASTM with Theraband roller  along rectus femoris and vastus lateralis, adductor longus and magnus     *not today*  L hip long-axis distraction in supine for axial decompression of LLE weightbearing joints Attempted inferior mobilization in 90 deg hip flexion (no belt) - stopped due to intolerance of direct pressure on proximal thigh STM along adductor magnus L hip gentle adductor stretch; 3x30sec L hip posterior mobilization (axial load through femur) with conjunct passive ER/IR within pt tolerance L hip lateral distraction with Mulligan belt, intermittent L hip MWM (c belt distraction) with hip flexion        Therapeutic exercise - for improved strength as needed for ability to perform transferring, weightbearing activity, obstacle negotiation; for soft tissue mobility and ROM     Sidelying clamshell; 2x10, Green Tband   Minisquat, with table behind patient; 2x10, with 6-lb Med ball goblet hold, *slow eccentric*   3-way Hip, standing with bilat UE support; x10 ea dir, bilat, with 3-lb cuff weight      *not today* Patient education: discussed HEP modification for butterfly stretch, bent-knee fallout [employer], blue swiss ball; 3-way (forward,  L and R diagonal); x5 ea dir Lower trunk rotations; x10 alternating Sidelying hip abduction; 1x10 Supine hip abduction AROM; x20 Seated knee extension; 2x10, bilateral; 7.5-lb cuff weight Butterfly stretch; 3x30sec, hooklying Open book; x10 ea side SLR; 2x10 Glute bridge; 2x10 Butterfly stretch; 3x30sec, hooklying         Neuromuscular Re-education - for movement control and trunk stabilization, targeting cueing for gluteal activation     Sidestep with Red Tband at distal shins; 3x down/back, length of // bars   Toe tap on 6-inch step; 2x10 alternating, standing on Airex pad   Dynamic march; x5 D/B length of // bars, with mirror feedback to maintain upright posture and fully shift weight to bilat LE          *not today* Hurdle stepping, 3  6-inch hurdles; 4x down/back length of // bars Tandem stance; 1x30 seconds, bilaterally  Tandem stance on foam; 1x30seconds, bilaterally Clamshell; 2x10, R sidelying [verbal cueing, tactile cueing, and demo for technique and to prevent trunk rotation compensation] Standing march; x10 alternating with unilateral table support  Pelvic tilt; 2x10, hooklying, anterior/posterior [minimal verbal and tactile cueing]         ASSESSMENT Patient has ongoing moderate-level pain that is experienced primarily in weightbearing with minimal pain during sitting/lying. She has ongoing L hip mobility deficits that have not changed over the previous month. Pt exhibits good L knee ROM. Pt has mixed response to lumbar spine distraction with diminished LLE pain at previous sessions, but report of aggravation of L iliolumbar region with distraction today. Pt is able to continue progression of weight-shifting, hip strengthening, and light closed-chain loading activity with  good effort demonstrated throughout session. Pt has remaining deficits in hip and LE strength, hip ROM, gait deviations, LLE weight shift/weight acceptance, and L iliolumbar/gluteal/groin pain. Patient will benefit from continued skilled PT intervention to address the above impairments and activity limitations for best return to PLOF.        PT Short Term Goals - 08/11/21 1647       PT SHORT TERM GOAL #1   Title Pt will be independent and 100% compliant with established HEP and activity modification as needed to augment PT intervention and improve pertinent strength and mobility deficits as needed for best return to prior level of function.    Baseline HEP provided at IE 06/15/21. 07/16/21: compliant and independent with exercise technique    Time 3    Period Weeks    Status Achieved    Target Date 07/06/21      PT SHORT TERM GOAL #2   Title Patient will perform independent sit to stand without UE support and no increase in pain indicative of  improved functional LE strength and ability to perform transferring necessary for accessing home and community environment    Baseline IE: Significant difficulty with sit to stand with heavy UE support required. 07/16/21: performed with moderate L hip pain c weightbearing. 08/11/21: performed without increase in pain    Time 4    Period Weeks    Status Achieved    Target Date 07/30/21               PT Long Term Goals - 08/31/21 1114       PT LONG TERM GOAL #1   Title Patient will demonstrate improved function as evidenced by a score of 59 on FOTO measure for full participation in activities at home and in the community.    Baseline 06/15/21: 50. 07/15/21: 49. 08/12/21: 49. 08/27/21: 47    Time 8    Period Weeks    Status Not Met    Target Date 09/24/21      PT LONG TERM GOAL #2   Title Patient will perform consecutive hurdle step over 3 6-inch hurdles with good heel to toe progression, no LOB, no buckling off affected LE, and no increase in pain indicative of improved ability to weight shift to affected LE and clear lower limb over obstacles as needed for functional mobility    Baseline IE: Antalgic gait pattern wth decreased LLE weight shift. 07/15/21: able to perform dynamic march in // bars with sound weight shift. 07/28/21: performed in parallel bars with no LOB and no net increase in pain/sypmtoms    Time 8    Period Weeks    Status Achieved    Target Date 08/10/21      PT LONG TERM GOAL #3   Title Patient will improve MMT to 4+/5 for all tested LE musculature indicative of improved strength as needed for improved performance of transferring and as needed to produce power to prevent LOB during episode of large postural perturbation    Baseline IE: MMT 3+ to 4/5 for hip musculature (flexion, ER, IR, ABD), 4/5 for hamstrings and ankle dorsiflexors. 07/15/21: Not met for ER/IR/ABD. otherwise 4+ or greater. 08/11/21: Not met for hip ER/IR/ABD. 08/27/21: Not met for hip ER/IR/ABD    Time 8     Period Weeks    Status Partially Met    Target Date 09/24/21      PT LONG TERM GOAL #4   Title Patient will ambulate x 300 feet  in clinic with no AD with symmetrical weight shift and normal heel to toe progression without LOB and no reproduction of pain indicative of improved community-level gait    Baseline IE: Gait with antalgic pattern and decreased stance time LLE. 07/15/21: improving weight shift to LLE, though pt still demonstrates moderate antalgic pattern that is worse following prolonged sitting. 08/11/21: Intermittent improved weight shift, though antalgic pattern is exacerbated after period of sitting/immobility. 08/27/21: pt demonstrates good heel to toe progression and normal stride, but decreased L weight shift/antalgic pattern is present    Time 8    Period Weeks    Status Partially Met    Target Date 09/24/21                   Plan - 09/24/21 0839     Clinical Impression Statement Patient has ongoing moderate-level pain that is experienced primarily in weightbearing with minimal pain during sitting/lying. She has ongoing L hip mobility deficits that have not changed over the previous month. Pt exhibits good L knee ROM. Pt has mixed response to lumbar spine distraction with diminished LLE pain at previous sessions, but report of aggravation of L iliolumbar region with distraction today. Pt is able to continue progression of weight-shifting, hip strengthening, and light closed-chain loading activity with good effort demonstrated throughout session. Pt has remaining deficits in hip and LE strength, hip ROM, gait deviations, LLE weight shift/weight acceptance, and L iliolumbar/gluteal/groin pain. Patient will benefit from continued skilled PT intervention to address the above impairments and activity limitations for best return to PLOF.    Personal Factors and Comorbidities Age;Comorbidity 3+;Time since onset of injury/illness/exacerbation    Comorbidities CAD, acid reflux, s/p TIA     Examination-Activity Limitations Bed Mobility;Stairs;Stand;Dressing;Bathing;Bend;Locomotion Level;Squat    Examination-Participation Restrictions Community Activity;Cleaning;Laundry    Stability/Clinical Decision Making Evolving/Moderate complexity    Rehab Potential Good    PT Frequency 2x / week    PT Duration 8 weeks    PT Treatment/Interventions ADLs/Self Care Home Management    PT Next Visit Plan Increasing emphasis on progressing intensity of unloaded isotonics with progressive weightbearing and CKC strengthening as tolerated. MT to modulate symptoms and improve tolerance of active drills in clinic prn.    PT Home Exercise Plan Access Code [phone].             Patient will benefit from skilled therapeutic intervention in order to improve the following deficits and impairments:  Abnormal gait, Hypomobility, Difficulty walking, Decreased strength, Pain, Decreased range of motion  Visit Diagnosis: Pain in left hip  Difficulty in walking, not elsewhere classified  Muscle weakness (generalized)  Stiffness of left hip, not elsewhere classified     Problem List Patient Active Problem List   Diagnosis Date Noted   Unstable angina ([place]) 01/19/2017   CAD (coronary artery disease) 01/19/2017   Valentina Gu, PT, DPT #[id number]  Eilleen Kempf, PT 09/24/2021, 8:39 AM  [facility] [address] [place], [place], [address] Phone: [phone]   Fax:  [phone]  Name: ADAMA IVINS MRN: [id number] Date of Birth: [birth year]

## 2021-09-24 ENCOUNTER — Encounter: Payer: Medicare Other | Admitting: Physical Therapy

## 2021-09-24 ENCOUNTER — Ambulatory Visit
Admission: EM | Admit: 2021-09-24 | Discharge: 2021-09-24 | Disposition: A | Discharge status: Home / Self Care | Payer: Medicare Other | Attending: Emergency Medicine | Admitting: Emergency Medicine

## 2021-09-24 ENCOUNTER — Other Ambulatory Visit: Payer: Self-pay

## 2021-09-24 ENCOUNTER — Encounter: Payer: Self-pay | Admitting: Physical Therapy

## 2021-09-24 DIAGNOSIS — Z955 Presence of coronary angioplasty implant and graft: Secondary | ICD-10-CM | POA: Diagnosis not present

## 2021-09-24 DIAGNOSIS — Z882 Allergy status to sulfonamides status: Secondary | ICD-10-CM | POA: Diagnosis not present

## 2021-09-24 DIAGNOSIS — Z881 Allergy status to other antibiotic agents status: Secondary | ICD-10-CM | POA: Insufficient documentation

## 2021-09-24 DIAGNOSIS — I2510 Atherosclerotic heart disease of native coronary artery without angina pectoris: Secondary | ICD-10-CM | POA: Diagnosis not present

## 2021-09-24 DIAGNOSIS — Z880 Allergy status to penicillin: Secondary | ICD-10-CM | POA: Insufficient documentation

## 2021-09-24 DIAGNOSIS — Z79899 Other long term (current) drug therapy: Secondary | ICD-10-CM | POA: Diagnosis not present

## 2021-09-24 DIAGNOSIS — U071 COVID-19: Secondary | ICD-10-CM | POA: Insufficient documentation

## 2021-09-24 DIAGNOSIS — Z8673 Personal history of transient ischemic attack (TIA), and cerebral infarction without residual deficits: Secondary | ICD-10-CM | POA: Insufficient documentation

## 2021-09-24 DIAGNOSIS — Z7982 Long term (current) use of aspirin: Secondary | ICD-10-CM | POA: Insufficient documentation

## 2021-09-24 DIAGNOSIS — R0981 Nasal congestion: Secondary | ICD-10-CM | POA: Diagnosis present

## 2021-09-24 DIAGNOSIS — J069 Acute upper respiratory infection, unspecified: Secondary | ICD-10-CM

## 2021-09-24 LAB — SARS CORONAVIRUS 2 (TAT 6-24 HRS): SARS Coronavirus 2: POSITIVE — AB

## 2021-09-24 MED ORDER — BENZONATATE 100 MG PO CAPS: 200.0000 mg | ORAL_CAPSULE | Freq: Three times a day (TID) | ORAL | 0 refills | Status: DC

## 2021-09-24 MED ORDER — IPRATROPIUM BROMIDE 0.06 % NA SOLN: 2.0000 | Freq: Four times a day (QID) | NASAL | 12 refills | Status: DC

## 2021-09-24 NOTE — ED Triage Notes (Signed)
Pt reports having nasal congestion and non productive cough since Tuesday. Covid exposure this week.

## 2021-09-24 NOTE — Discharge Instructions (Addendum)
Isolate at home pending the results of your COVID test.  If you test positive then you will have to quarantine for 5 days from the start of your symptoms.  After 5 days you can break quarantine if your symptoms have improved and you have not had a fever for 24 hours without taking Tylenol or ibuprofen.  Use over-the-counter Tylenol and ibuprofen as needed for body aches and fever.  Use the Atrovent nasal spray, 2 squirts every 6 hours, as needed for nasal congestion.  Use the Tessalon Perles during the day as needed for cough.  If your test for COVID is positive we will prescribe Paxlovid twice daily for 5 days.   If you develop any increased shortness of breath-especially at rest, you are unable to speak in full sentences, or is a late sign your lips are turning blue you need to go the ER for evaluation.

## 2021-09-24 NOTE — ED Provider Notes (Signed)
[facility]    CSN: [id number] Arrival date & time: 09/24/21  6606      History   Chief Complaint Chief Complaint  Patient presents with   Nasal Congestion    HPI Kristen Lloyd is a 80 y.o. female.   HPI  80 year old female here for evaluation of respiratory complaints.  Patient reports that she has been experiencing nasal congestion and a nonproductive cough for the last 2 days.  She was recent exposed to her husband who tested COVID-positive over the weekend.  She states that she is getting some mild nasal drainage that is clear when she blows her nose but does not have a runny nose.  She is also had some mild ear pressure in both ears.  She denies fever, sore throat, shortness of breath or wheezing, GI complaints, or body aches.  Patient has a history of CAD, stroke, diverticulitis, and unstable angina.  Past Medical History:  Diagnosis Date   Acid reflux    Coronary artery disease    Diverticulitis    Stroke [facility])     Patient Active Problem List   Diagnosis Date Noted   Unstable angina ([place]) 01/19/2017   CAD (coronary artery disease) 01/19/2017    Past Surgical History:  Procedure Laterality Date   ABDOMINAL HYSTERECTOMY     BREAST BIOPSY Left 1990's   lt bx x 2-neg   CARDIAC CATHETERIZATION N/A 01/19/2017   Procedure: Left Heart Cath and Coronary Angiography;  Surgeon: Teodoro Spray, MD;  Location: [facility];  Service: Cardiovascular;  Laterality: N/A;   CARDIAC CATHETERIZATION N/A 01/19/2017   Procedure: Coronary Stent Intervention;  Surgeon: Isaias Cowman, MD;  Location: [facility];  Service: Cardiovascular;  Laterality: N/A;   CARDIAC SURGERY     Ileostomy and reversal N/A 2021   TONSILLECTOMY      OB History   No obstetric history on file.      Home Medications    Prior to Admission medications   Medication Sig Start Date End Date Taking? Authorizing Provider  benzonatate (TESSALON) 100 MG capsule Take 2  capsules (200 mg total) by mouth every 8 (eight) hours. 09/24/21  Yes Margarette Canada, NP  ipratropium (ATROVENT) 0.06 % nasal spray Place 2 sprays into both nostrils 4 (four) times daily. 09/24/21  Yes Margarette Canada, NP  alum & mag hydroxide-simeth (MYLANTA) 200-200-20 MG/5ML suspension Take 30 mLs by mouth every 6 (six) hours as needed for indigestion or heartburn.    [provider]  alum hydroxide-mag trisilicate (GAVISCON) 30-16 MG CHEW chewable tablet Chew 1 tablet by mouth 5 (five) times daily as needed for indigestion or heartburn.    [provider]  aspirin 81 MG tablet Take 81 mg by mouth daily.    [provider]  cholecalciferol (VITAMIN D) 1000 UNITS tablet Take 1,000 Units by mouth daily.    [provider]  fenofibrate 160 MG tablet Take 80 mg by mouth daily.    [provider]  fluticasone (FLONASE) 50 MCG/ACT nasal spray Place 1 spray into both nostrils 2 (two) times daily. 06/18/18 08/17/18  Betancourt, Aura Fey, NP  omeprazole (PRILOSEC) 20 MG capsule Take 1 capsule (20 mg total) by mouth daily. Take 12 hours apart from plavix 01/20/17   Teodoro Spray, MD  sodium chloride (OCEAN) 0.65 % SOLN nasal spray Place 2 sprays into both nostrils every 2 (two) hours while awake. Patient taking differently: Place 2 sprays into both nostrils 2 (  two) times daily as needed for congestion.  11/07/15   Betancourt, Aura Fey, NP  traMADol (ULTRAM) 50 MG tablet Take 25 mg by mouth every 6 (six) hours as needed for moderate pain.    [provider]    Family History Family History  Problem Relation Age of Onset   Breast cancer Cousin 24       mat cousin    Social History Social History   Tobacco Use   Smoking status: Never   Smokeless tobacco: Never  Vaping Use   Vaping Use: Never used  Substance Use Topics   Alcohol use: Yes    Comment: Socially   Drug use: No     Allergies   Amoxicillin, Flagyl [metronidazole], Levofloxacin,  Moxifloxacin, and Septra [sulfamethoxazole-trimethoprim]   Review of Systems Review of Systems  Constitutional:  Negative for activity change, appetite change and fever.  HENT:  Positive for congestion and ear pain. Negative for rhinorrhea and sore throat.   Respiratory:  Positive for cough. Negative for shortness of breath and wheezing.   Gastrointestinal:  Negative for diarrhea, nausea and vomiting.  Musculoskeletal:  Negative for arthralgias and myalgias.  Skin:  Negative for rash.  Hematological: Negative.   Psychiatric/Behavioral: Negative.      Physical Exam Triage Vital Signs ED Triage Vitals  Enc Vitals Group     BP 09/24/21 0947 (!) 142/74     Pulse Rate 09/24/21 0947 96     Resp 09/24/21 0947 16     Temp 09/24/21 0947 97.8 F (36.6 C)     Temp Source 09/24/21 0947 Oral     SpO2 09/24/21 0947 100 %     Weight 09/24/21 0948 139 lb (63 kg)     Height 09/24/21 0948 5\' 1"  (1.549 m)     Head Circumference --      Peak Flow --      Pain Score 09/24/21 0948 0     Pain Loc --      Pain Edu? --      Excl. in [place]? --    No data found.  Updated Vital Signs BP (!) 142/74   Pulse 96   Temp 97.8 F (36.6 C) (Oral)   Resp 16   Ht 5\' 1"  (1.549 m)   Wt 139 lb (63 kg)   SpO2 100%   BMI 26.26 kg/m   Visual Acuity Right Eye Distance:   Left Eye Distance:   Bilateral Distance:    Right Eye Near:   Left Eye Near:    Bilateral Near:     Physical Exam Vitals and nursing note reviewed.  Constitutional:      General: She is not in acute distress.    Appearance: Normal appearance. She is not ill-appearing.  HENT:     Head: Normocephalic and atraumatic.     Right Ear: Tympanic membrane, ear canal and external ear normal. There is no impacted cerumen.     Left Ear: Tympanic membrane, ear canal and external ear normal. There is no impacted cerumen.     Nose: Congestion present. No rhinorrhea.     Mouth/Throat:     Mouth: Mucous membranes are moist.     Pharynx:  Oropharynx is clear. No posterior oropharyngeal erythema.  Cardiovascular:     Rate and Rhythm: Normal rate and regular rhythm.     Pulses: Normal pulses.     Heart sounds: Normal heart sounds.  Pulmonary:     Effort: Pulmonary effort is normal.  Breath sounds: Normal breath sounds. No wheezing, rhonchi or rales.  Musculoskeletal:     Cervical back: Normal range of motion and neck supple.  Lymphadenopathy:     Cervical: No cervical adenopathy.  Skin:    General: Skin is warm and dry.     Capillary Refill: Capillary refill takes less than 2 seconds.     Findings: No erythema or rash.  Neurological:     General: No focal deficit present.     Mental Status: She is alert and oriented to person, place, and time.  Psychiatric:        Mood and Affect: Mood normal.        Behavior: Behavior normal.        Thought Content: Thought content normal.        Judgment: Judgment normal.     UC Treatments / Results  Labs (all labs ordered are listed, but only abnormal results are displayed) Labs Reviewed  SARS CORONAVIRUS 2 (TAT 6-24 HRS)    EKG   Radiology No results found.  Procedures Procedures (including critical care time)  Medications Ordered in UC Medications - No data to display  Initial Impression / Assessment and Plan / UC Course  I have reviewed the triage vital signs and the nursing notes.  Pertinent labs & imaging results that were available during my care of the patient were reviewed by me and considered in my medical decision making (see chart for details).  Patient is a nontoxic-appearing 80 year old female here for evaluation of respiratory complaints that she developed 2 days ago.  She is also had a recent COVID exposure through her husband who tested positive.  She denies any recent travel but she did recently attend a [place] funeral without a mask.  She has been vaccinated and has received the most recent booster with the omicron variant in the past week.   Patient symptoms are very mild with mild nasal congestion and some clear nasal discharge when she blows but no runny nose.  She also is a mild nonproductive cough.  She has not coughed once during the history taking and she does not have any tachypnea or dyspnea.  Patient's physical exam reveals pearly gray tympanic membranes bilaterally with normal light reflex and clear external auditory canals.  Nasal mucosa has mild erythema and edema without significant nasal discharge appreciated.  Oropharyngeal exam is benign.  No cervical lymphadenopathy appreciated on exam.  Cardiopulmonary exam reveals clear lung sounds in all fields.  I have offered patient Tessalon Perles to help with her cough and she says that what she is using over-the-counter has been helping.  I did send a prescription to the pharmacy that can be on hold in case she needs some.  Patient is agreeable.  I will also give the patient a prescription for Atrovent nasal spray to help with nasal congestion as needed.  With her significant cardiovascular history patient is a candidate for antiviral therapy should she test positive.  She had a CMP in June of this year which showed a GFR of 60 so she does qualify for the renal dosing of Paxlovid.  I have advised her to isolate pending the results of the test.  ER and return precautions reviewed with patient.   Final Clinical Impressions(s) / UC Diagnoses   Final diagnoses:  Viral URI with cough     Discharge Instructions      Isolate at home pending the results of your COVID test.  If you test positive  then you will have to quarantine for 5 days from the start of your symptoms.  After 5 days you can break quarantine if your symptoms have improved and you have not had a fever for 24 hours without taking Tylenol or ibuprofen.  Use over-the-counter Tylenol and ibuprofen as needed for body aches and fever.  Use the Atrovent nasal spray, 2 squirts every 6 hours, as needed for nasal  congestion.  Use the Tessalon Perles during the day as needed for cough.  If your test for COVID is positive we will prescribe Paxlovid twice daily for 5 days.   If you develop any increased shortness of breath-especially at rest, you are unable to speak in full sentences, or is a late sign your lips are turning blue you need to go the ER for evaluation.      ED Prescriptions     Medication Sig Dispense Auth. Provider   ipratropium (ATROVENT) 0.06 % nasal spray Place 2 sprays into both nostrils 4 (four) times daily. 15 mL Margarette Canada, NP   benzonatate (TESSALON) 100 MG capsule Take 2 capsules (200 mg total) by mouth every 8 (eight) hours. 21 capsule Margarette Canada, NP      PDMP not reviewed this encounter.   Margarette Canada, NP 09/24/21 1023

## 2021-09-25 ENCOUNTER — Telehealth (HOSPITAL_COMMUNITY): Payer: Self-pay | Admitting: Emergency Medicine

## 2021-09-25 MED ORDER — MOLNUPIRAVIR EUA 200MG CAPSULE: 4.0000 | ORAL_CAPSULE | Freq: Two times a day (BID) | ORAL | 0 refills | Status: AC

## 2021-10-01 ENCOUNTER — Encounter: Payer: Medicare Other | Admitting: Physical Therapy

## 2021-10-06 ENCOUNTER — Other Ambulatory Visit: Payer: Self-pay

## 2021-10-06 ENCOUNTER — Ambulatory Visit: Payer: Medicare Other | Attending: Family Medicine | Admitting: Physical Therapy

## 2021-10-06 ENCOUNTER — Encounter: Payer: Self-pay | Admitting: Physical Therapy

## 2021-10-06 DIAGNOSIS — M6281 Muscle weakness (generalized): Secondary | ICD-10-CM | POA: Insufficient documentation

## 2021-10-06 DIAGNOSIS — M25552 Pain in left hip: Secondary | ICD-10-CM | POA: Diagnosis present

## 2021-10-06 DIAGNOSIS — M25652 Stiffness of left hip, not elsewhere classified: Secondary | ICD-10-CM | POA: Diagnosis present

## 2021-10-06 DIAGNOSIS — R262 Difficulty in walking, not elsewhere classified: Secondary | ICD-10-CM | POA: Diagnosis present

## 2021-10-06 NOTE — Therapy (Signed)
[facility] [address] [place], [place], [address] Phone: [phone]   Fax:  [phone]  Physical Therapy Treatment/ Physical Therapy Progress Note/Recertification   Dates of reporting period  08/27/21   to   10/06/21   Patient Details  Name: Kristen Lloyd MRN: [id number] Date of Birth: 1941-08-04 Referring Provider (PT): Allene Dillon, FNP   Encounter Date: 10/06/2021   PT End of Session - 10/08/21 6962     Visit Number 27    Number of Visits 35    Date for PT Re-Evaluation 08/11/21    Authorization Time Period recert 95/28/41-32/44/01    Progress Note Due on Visit 41    PT Start Time 1600    PT Stop Time 1645    PT Time Calculation (min) 45 min    Activity Tolerance Patient tolerated treatment well;Patient limited by pain    Behavior During Therapy [facility] for tasks assessed/performed             Past Medical History:  Diagnosis Date   Acid reflux    Coronary artery disease    Diverticulitis    Stroke [facility])     Past Surgical History:  Procedure Laterality Date   ABDOMINAL HYSTERECTOMY     BREAST BIOPSY Left 1990's   lt bx x 2-neg   CARDIAC CATHETERIZATION N/A 01/19/2017   Procedure: Left Heart Cath and Coronary Angiography;  Surgeon: Teodoro Spray, MD;  Location: [place] [facility];  Service: Cardiovascular;  Laterality: N/A;   CARDIAC CATHETERIZATION N/A 01/19/2017   Procedure: Coronary Stent Intervention;  Surgeon: Isaias Cowman, MD;  Location: [place] [facility];  Service: Cardiovascular;  Laterality: N/A;   CARDIAC SURGERY     Ileostomy and reversal N/A 2021   TONSILLECTOMY      There were no vitals filed for this visit.   Subjective Assessment - 10/08/21 0714     Subjective Pt has hip injection scheduled for next Thursday. Patient had recent COVID-19 infection and has since recovered and is out of her quarantine period. Patient reports about 5-6/10 pain at arrival to PT today. Patient  reports she used to walk up to 3 mi for exercise, but she cannot do that anymore. She reports 50% SANE score. She feels that she has felt better with PT intervention, but she is still limited with significant volume of weightbearing activity. Pt feels she is able to get to her appointments and access buildings in town well - she uses [facility] for community-level mobility.    Pertinent History Patient is an 80 year old female with primary complaint of L hip pain. Patient has referring diagnosis of L hip OA. Patient reports about 2 years of hip pain. She reports she was doing senior fitness program at [facility] at the time her symptoms began. She states that the staff noticed a change in her gait pattern and she felt minmal pain. This has since worsened. Patient states she was very sick last year and didn't do as much exercise at the time. Patient had ileostomy and reversal in 2021 with significant GI symptoms - significant recovery time required and patient had notably decreased physical activity. Patient did have steroid injection 05/19/2021 that did provide moderate relief. She feels that later in the day she is getting pain into thoracolumbar region from the effect her condition has on her walking. She states that during the day it "isn't that bad." Patient reports no night pain. Pain is worse in AM. Hx  of TIA in 2015. No numbness or tingling. Patient stated goals: to avoid surgery and to walk better.    Limitations Standing;Walking;House hold activities    How long can you stand comfortably? 10 minutes    How long can you walk comfortably? 30-60 min in store without significant increase in pain    Diagnostic tests L hip radiographs; evidence of OA/degenerative change    Patient Stated Goals To avoid surgery, able to walk better                OBJECTIVE FINDINGS     Posture Decreased lumbar lordosis, mild rounded shoulders in static sitting/standing   Gait Antalgic pattern with decreased  stance time LLE, decreased LLE weight shift during stance phase, good stride and gait velocity, forward trunk lean during stance phase LLE [Ambulated 310 feet in hallway]   Palpation Tenderness to palpation along L greater trochanter, L piriformis/gemelli complex, L gluteus medius/minimus   Strength (out of 5) R/L 5/5- Hip flexion 5/4- Hip ER 4+/4 (crepitus) Hip IR Not tested/4- Hip abduction 5/5 Hip adduction 5/5 Knee extension 5/4+ Knee flexion 5/5 Ankle dorsiflexion *Indicates pain     AROM (degrees) R/L (all movements include overpressure unless otherwise stated) Lumbar forward flexion (65): WNL  Lumbar extension (30): <25 Lumbar lateral flexion (25): R: 75% L: 50% Thoracic and Lumbar rotation (30 degrees):  R: 25%* (pain in groin) L: 50% Hip ER (0-45): R: 45, L: 15 Hip Flexion (0-125): R: WFL, L WFL Hip Abduction (0-40): R: 40 L: 20 *Indicates pain     PROM (degrees) PROM = AROM R/L Hip IR (0-45): R: 15 L: 16 Hip ER (0-45): R: 40, L: 40 Hip Flexion (0-125): R: 110, L 110* Hip Abduction (0-40): R: 40 L: 23 Hip extension (0-15): R: not tested L: to neutral * *Indicates pain            TREATMENT      NuStep x 5 minutes, Level 4; seat 6, arms 7 - unbilled time     Therapeutic Activities  Re-assessment performed (see above)    Manual Therapy - for joint and soft tissue mobility, symptom modulation, hip ROM   L hip PROM within pt tolerance   *next visit* STM and IASTM with Hypervolt along bilat L3-L5 erector spinae (emphasis on L side) and L gluteal mm in R sidelying   STM/IASTM with Theraband roller along rectus femoris and vastus lateralis, adductor longus and magnus       *not today*  Bilateral lower extremity long axis distraction in supine, intermittent [aggravating after 4 min, D/C] L hip long-axis distraction in supine for axial decompression of LLE weightbearing joints Attempted inferior mobilization in 90 deg hip flexion (no belt) -  stopped due to intolerance of direct pressure on proximal thigh STM along adductor magnus L hip gentle adductor stretch; 3x30sec L hip posterior mobilization (axial load through femur) with conjunct passive ER/IR within pt tolerance L hip lateral distraction with Mulligan belt, intermittent L hip MWM (c belt distraction) with hip flexion          ASSESSMENT Patient has minimal pain in non-weightbearing/sitting/lying, but she has pain with higher volume of walking and weightbearing activity in L hip and L knee. Pt has primary complaint of remaining L groin pain. She is able to complete community-level mobility well with use of AD. She does have ongoing notable gait deviations that have modestly improved since IE. She has minimal improvement in MMTs with hip abduction improved by  one grade (no change in other muscles tested). She is able to ambulate community-level distance in clinic without significant increase in pain, but she has ongoing gait deviations. Pt has better FOTO score than last re-assessment, but she has still not met FOTO goal. Patient's POC has been prolonged given small incremental gains in the above impairments and pt feeling she has improved moderately. Discussed at length with pt that continued PT does require adequate progress with therapy to justify continued visits, especially in context of prolonged POC. Pt is appropriate for continued PT intervention given modest progress obtained. Pt has remaining deficits in hip and LE strength, hip ROM, gait deviations, LLE weight shift/weight acceptance, and L iliolumbar/gluteal/groin pain. Patient will benefit from continued skilled PT intervention with focus on improving function and strengthening to address the above impairments and activity limitations for best return to PLOF.       PT Short Term Goals - 08/11/21 1647       PT SHORT TERM GOAL #1   Title Pt will be independent and 100% compliant with established HEP and activity  modification as needed to augment PT intervention and improve pertinent strength and mobility deficits as needed for best return to prior level of function.    Baseline HEP provided at IE 06/15/21. 07/16/21: compliant and independent with exercise technique    Time 3    Period Weeks    Status Achieved    Target Date 07/06/21      PT SHORT TERM GOAL #2   Title Patient will perform independent sit to stand without UE support and no increase in pain indicative of improved functional LE strength and ability to perform transferring necessary for accessing home and community environment    Baseline IE: Significant difficulty with sit to stand with heavy UE support required. 07/16/21: performed with moderate L hip pain c weightbearing. 08/11/21: performed without increase in pain    Time 4    Period Weeks    Status Achieved    Target Date 07/30/21               PT Long Term Goals - 10/06/21 1611       PT LONG TERM GOAL #1   Title Patient will demonstrate improved function as evidenced by a score of 59 on FOTO measure for full participation in activities at home and in the community.    Baseline 06/15/21: 50. 07/15/21: 49. 08/12/21: 49. 08/27/21: 47.   10/06/21: 52.    Time 8    Period Weeks    Status Not Met    Target Date 10/29/21      PT LONG TERM GOAL #2   Title Patient will perform consecutive hurdle step over 3 6-inch hurdles with good heel to toe progression, no LOB, no buckling off affected LE, and no increase in pain indicative of improved ability to weight shift to affected LE and clear lower limb over obstacles as needed for functional mobility    Baseline IE: Antalgic gait pattern wth decreased LLE weight shift. 07/15/21: able to perform dynamic march in // bars with sound weight shift. 07/28/21: performed in parallel bars with no LOB and no net increase in pain/sypmtoms    Time 8    Period Weeks    Status Achieved    Target Date 08/10/21      PT LONG TERM GOAL #3   Title Patient  will improve MMT to 4+/5 for all tested LE musculature indicative of improved strength as needed  for improved performance of transferring and as needed to produce power to prevent LOB during episode of large postural perturbation    Baseline IE: MMT 3+ to 4/5 for hip musculature (flexion, ER, IR, ABD), 4/5 for hamstrings and ankle dorsiflexors. 07/15/21: Not met for ER/IR/ABD. otherwise 4+ or greater. 08/11/21: Not met for hip ER/IR/ABD. 08/27/21: Not met for hip ER/IR/ABD.    10/06/21: Not met for L hip ER/IR/ABD, modest improvement from previous progress note.    Time 8    Period Weeks    Status Partially Met    Target Date 10/29/21      PT LONG TERM GOAL #4   Title Patient will ambulate x 300 feet in clinic with no AD with symmetrical weight shift and normal heel to toe progression without LOB and no reproduction of pain indicative of improved community-level gait    Baseline IE: Gait with antalgic pattern and decreased stance time LLE. 07/15/21: improving weight shift to LLE, though pt still demonstrates moderate antalgic pattern that is worse following prolonged sitting. 08/11/21: Intermittent improved weight shift, though antalgic pattern is exacerbated after period of sitting/immobility. 08/27/21: pt demonstrates good heel to toe progression and normal stride, but decreased L weight shift/antalgic pattern is present.   10/06/21: Pt is able to ambulate 310 feet in clinic with noted gait deviations, LLE antalgic pattern.    Time 8    Period Weeks    Status Partially Met    Target Date 10/29/21                   Plan - 10/08/21 0920     Clinical Impression Statement Patient has minimal pain in non-weightbearing/sitting/lying, but she has pain with higher volume of walking and weightbearing activity in L hip and L knee. Pt has primary complaint of remaining L groin pain. She is able to complete community-level mobility well with use of AD. She does have ongoing notable gait deviations that  have modestly improved since IE. She has minimal improvement in MMTs with hip abduction improved by one grade (no change in other muscles tested). She is able to ambulate community-level distance in clinic without significant increase in pain, but she has ongoing gait deviations. Pt has better FOTO score than last re-assessment, but she has still not met FOTO goal. Patient's POC has been prolonged given small incremental gains in the above impairments and pt feeling she has improved moderately. Discussed at length with pt that continued PT does require adequate progress with therapy to justify continued visits, especially in context of prolonged POC. Pt is appropriate for continued PT intervention given modest progress obtained. Pt has remaining deficits in hip and LE strength, hip ROM, gait deviations, LLE weight shift/weight acceptance, and L iliolumbar/gluteal/groin pain. Patient will benefit from continued skilled PT intervention with focus on improving function and strengthening to address the above impairments and activity limitations for best return to PLOF.    Personal Factors and Comorbidities Age;Comorbidity 3+;Time since onset of injury/illness/exacerbation    Comorbidities CAD, acid reflux, s/p TIA    Examination-Activity Limitations Bed Mobility;Stairs;Stand;Dressing;Bathing;Bend;Locomotion Level;Squat    Examination-Participation Restrictions Community Activity;Cleaning;Laundry    Stability/Clinical Decision Making Evolving/Moderate complexity    Rehab Potential Good    PT Frequency 2x / week    PT Duration 8 weeks    PT Treatment/Interventions ADLs/Self Care Home Management    PT Next Visit Plan Increasing emphasis on progressing intensity of unloaded isotonics with progressive weightbearing and CKC strengthening as tolerated. MT  to modulate symptoms and improve tolerance of active drills in clinic prn. Recommend continued PT 2x/week for 4 weeks    PT Home Exercise Plan Access Code  [phone].             Patient will benefit from skilled therapeutic intervention in order to improve the following deficits and impairments:  Abnormal gait, Hypomobility, Difficulty walking, Decreased strength, Pain, Decreased range of motion  Visit Diagnosis: Pain in left hip  Difficulty in walking, not elsewhere classified  Muscle weakness (generalized)  Stiffness of left hip, not elsewhere classified     Problem List Patient Active Problem List   Diagnosis Date Noted   Unstable angina ([place]) 01/19/2017   CAD (coronary artery disease) 01/19/2017   Valentina Gu, PT, DPT #[id number]  Eilleen Kempf, PT 10/08/2021, 9:24 AM  [facility] [address] [place], [place], [address] Phone: [phone]   Fax:  [phone]  Name: Kristen Lloyd MRN: [id number] Date of Birth: [birth year]

## 2021-10-08 ENCOUNTER — Other Ambulatory Visit: Payer: Self-pay

## 2021-10-08 ENCOUNTER — Encounter: Payer: Self-pay | Admitting: Physical Therapy

## 2021-10-08 ENCOUNTER — Ambulatory Visit: Payer: Medicare Other | Admitting: Physical Therapy

## 2021-10-08 DIAGNOSIS — M25652 Stiffness of left hip, not elsewhere classified: Secondary | ICD-10-CM

## 2021-10-08 DIAGNOSIS — R262 Difficulty in walking, not elsewhere classified: Secondary | ICD-10-CM

## 2021-10-08 DIAGNOSIS — M25552 Pain in left hip: Secondary | ICD-10-CM

## 2021-10-08 DIAGNOSIS — M6281 Muscle weakness (generalized): Secondary | ICD-10-CM

## 2021-10-08 NOTE — Therapy (Signed)
[place] [facility] [address] [place], [place], [address] Phone: [phone]   Fax:  [phone]  Physical Therapy Treatment  Patient Details  Name: Kristen Lloyd MRN: [id number] Date of Birth: Nov 21, 1941 Referring Provider (PT): Allene Dillon, FNP   Encounter Date: 10/08/2021   PT End of Session - 10/08/21 1600     Visit Number 28    Number of Visits 35    Date for PT Re-Evaluation 08/11/21    Authorization Time Period recert 68/34/19-62/22/97    Progress Note Due on Visit 26    PT Start Time 1555    PT Stop Time 1641    PT Time Calculation (min) 46 min    Activity Tolerance Patient tolerated treatment well;Patient limited by pain    Behavior During Therapy [facility] for tasks assessed/performed             Past Medical History:  Diagnosis Date   Acid reflux    Coronary artery disease    Diverticulitis    Stroke [facility])     Past Surgical History:  Procedure Laterality Date   ABDOMINAL HYSTERECTOMY     BREAST BIOPSY Left 1990's   lt bx x 2-neg   CARDIAC CATHETERIZATION N/A 01/19/2017   Procedure: Left Heart Cath and Coronary Angiography;  Surgeon: Teodoro Spray, MD;  Location: [facility];  Service: Cardiovascular;  Laterality: N/A;   CARDIAC CATHETERIZATION N/A 01/19/2017   Procedure: Coronary Stent Intervention;  Surgeon: Isaias Cowman, MD;  Location: [facility];  Service: Cardiovascular;  Laterality: N/A;   CARDIAC SURGERY     Ileostomy and reversal N/A 2021   TONSILLECTOMY      There were no vitals filed for this visit.   Subjective Assessment - 10/08/21 1557     Subjective Patient reports mild pain at arrival to PT. Patient reports more pain with walking with recent change in weather. Patient reports intermittent non-compliance with HEP. Patient reports 4-5/10 pain at arrival to PT.    Pertinent History Patient is an 80 year old female with primary complaint of L hip pain. Patient has  referring diagnosis of L hip OA. Patient reports about 2 years of hip pain. She reports she was doing senior fitness program at [facility] at the time her symptoms began. She states that the staff noticed a change in her gait pattern and she felt minmal pain. This has since worsened. Patient states she was very sick last year and didn't do as much exercise at the time. Patient had ileostomy and reversal in 2021 with significant GI symptoms - significant recovery time required and patient had notably decreased physical activity. Patient did have steroid injection 05/19/2021 that did provide moderate relief. She feels that later in the day she is getting pain into thoracolumbar region from the effect her condition has on her walking. She states that during the day it "isn't that bad." Patient reports no night pain. Pain is worse in AM. Hx of TIA in 2015. No numbness or tingling. Patient stated goals: to avoid surgery and to walk better.    Limitations Standing;Walking;House hold activities    How long can you stand comfortably? 10 minutes    How long can you walk comfortably? 30-60 min in store without significant increase in pain    Diagnostic tests L hip radiographs; evidence of OA/degenerative change    Patient Stated Goals To avoid surgery, able to walk better  Treatment Performed     NuStep x 5 minutes, Level 4; seat 6, arms 7 - unbilled time     TREATMENT     Manual Therapy - for joint and soft tissue mobility, symptom modulation, hip ROM   L hip PROM within pt tolerance   STM and IASTM with Hypervolt along L gluteal mm in R sidelying     STM/IASTM with Theraband roller along rectus femoris and vastus lateralis, adductor longus and magnus       *not today*  Bilateral lower extremity long axis distraction in supine, intermittent [aggravating after 4 min, D/C] L hip long-axis distraction in supine for axial decompression of LLE weightbearing joints Attempted  inferior mobilization in 90 deg hip flexion (no belt) - stopped due to intolerance of direct pressure on proximal thigh STM along adductor magnus L hip gentle adductor stretch; 3x30sec L hip posterior mobilization (axial load through femur) with conjunct passive ER/IR within pt tolerance L hip lateral distraction with Mulligan belt, intermittent L hip MWM (c belt distraction) with hip flexion        Therapeutic exercise - for improved strength as needed for ability to perform transferring, weightbearing activity, obstacle negotiation; for soft tissue mobility and ROM   Lower trunk rotations; x10 alternating   Sidelying hip abduction; 2x6  Sidelying clamshell; 2x10, Green Tband   Minisquat, with table behind patient; 2x10, with 6-lb Med ball goblet hold, *slow eccentric*      *not today* 3-way Hip, standing with bilat UE support; x10 ea dir, bilat, with 3-lb cuff weight  Patient education: discussed HEP modification for butterfly stretch, bent-knee fallout [employer], blue swiss ball; 3-way (forward,  L and R diagonal); x5 ea dir  Supine hip abduction AROM; x20 Seated knee extension; 2x10, bilateral; 7.5-lb cuff weight Butterfly stretch; 3x30sec, hooklying Open book; x10 ea side SLR; 2x10 Glute bridge; 2x10 Butterfly stretch; 3x30sec, hooklying         Neuromuscular Re-education - for movement control and trunk stabilization, targeting cueing for gluteal activation     Sidestep with Red Tband at distal shins; 4x down/back, length of // bars   Toe tap on 6-inch step; 2x10 alternating with intermittent touch on // bars; 1x10 with no UE support, standing on Airex pad   Forward step up to hurdle step, 6-inch step on staircase; R handrail UE support; 1x8         *not today* Hurdle stepping, 3 6-inch hurdles; 4x down/back length of // bars Tandem stance; 1x30 seconds, bilaterally  Tandem stance on foam; 1x30seconds, bilaterally Clamshell; 2x10, R sidelying  [verbal cueing, tactile cueing, and demo for technique and to prevent trunk rotation compensation] Standing march; x10 alternating with unilateral table support  Pelvic tilt; 2x10, hooklying, anterior/posterior [minimal verbal and tactile cueing]         ASSESSMENT Patient has pain mainly with end-range hip flexion, ER, IR and with weightbearing at this time. She has ongoing hip mobility deficits that have changed minimally through this episode of care. Patient is able to progress with open-chain hip abduction work in clinic today with low volume. Continued emphasis on progressive gluteal and LE closed-chain strengthening. She is awaiting hip injection - scheduled for next Wednesday 10/14/21.  Pt has remaining deficits in hip and LE strength, hip ROM, gait deviations, LLE weight shift/weight acceptance, and L iliolumbar/gluteal/groin pain. Patient will benefit from continued skilled PT intervention to address the above impairments and activity limitations for best return to PLOF.  PT Short Term Goals - 08/11/21 1647       PT SHORT TERM GOAL #1   Title Pt will be independent and 100% compliant with established HEP and activity modification as needed to augment PT intervention and improve pertinent strength and mobility deficits as needed for best return to prior level of function.    Baseline HEP provided at IE 06/15/21. 07/16/21: compliant and independent with exercise technique    Time 3    Period Weeks    Status Achieved    Target Date 07/06/21      PT SHORT TERM GOAL #2   Title Patient will perform independent sit to stand without UE support and no increase in pain indicative of improved functional LE strength and ability to perform transferring necessary for accessing home and community environment    Baseline IE: Significant difficulty with sit to stand with heavy UE support required. 07/16/21: performed with moderate L hip pain c weightbearing. 08/11/21: performed without increase  in pain    Time 4    Period Weeks    Status Achieved    Target Date 07/30/21               PT Long Term Goals - 10/06/21 1611       PT LONG TERM GOAL #1   Title Patient will demonstrate improved function as evidenced by a score of 59 on FOTO measure for full participation in activities at home and in the community.    Baseline 06/15/21: 50. 07/15/21: 49. 08/12/21: 49. 08/27/21: 47.   10/06/21: 52.    Time 8    Period Weeks    Status Not Met    Target Date 10/29/21      PT LONG TERM GOAL #2   Title Patient will perform consecutive hurdle step over 3 6-inch hurdles with good heel to toe progression, no LOB, no buckling off affected LE, and no increase in pain indicative of improved ability to weight shift to affected LE and clear lower limb over obstacles as needed for functional mobility    Baseline IE: Antalgic gait pattern wth decreased LLE weight shift. 07/15/21: able to perform dynamic march in // bars with sound weight shift. 07/28/21: performed in parallel bars with no LOB and no net increase in pain/sypmtoms    Time 8    Period Weeks    Status Achieved    Target Date 08/10/21      PT LONG TERM GOAL #3   Title Patient will improve MMT to 4+/5 for all tested LE musculature indicative of improved strength as needed for improved performance of transferring and as needed to produce power to prevent LOB during episode of large postural perturbation    Baseline IE: MMT 3+ to 4/5 for hip musculature (flexion, ER, IR, ABD), 4/5 for hamstrings and ankle dorsiflexors. 07/15/21: Not met for ER/IR/ABD. otherwise 4+ or greater. 08/11/21: Not met for hip ER/IR/ABD. 08/27/21: Not met for hip ER/IR/ABD.    10/06/21: Not met for L hip ER/IR/ABD, modest improvement from previous progress note.    Time 8    Period Weeks    Status Partially Met    Target Date 10/29/21      PT LONG TERM GOAL #4   Title Patient will ambulate x 300 feet in clinic with no AD with symmetrical weight shift and normal heel to  toe progression without LOB and no reproduction of pain indicative of improved community-level gait    Baseline IE: Gait with  antalgic pattern and decreased stance time LLE. 07/15/21: improving weight shift to LLE, though pt still demonstrates moderate antalgic pattern that is worse following prolonged sitting. 08/11/21: Intermittent improved weight shift, though antalgic pattern is exacerbated after period of sitting/immobility. 08/27/21: pt demonstrates good heel to toe progression and normal stride, but decreased L weight shift/antalgic pattern is present.   10/06/21: Pt is able to ambulate 310 feet in clinic with noted gait deviations, LLE antalgic pattern.    Time 8    Period Weeks    Status Partially Met    Target Date 10/29/21                   Plan - 10/08/21 1655     Clinical Impression Statement Patient has pain mainly with end-range hip flexion, ER, IR and with weightbearing at this time. She has ongoing hip mobility deficits that have changed minimally through this episode of care. Patient is able to progress with open-chain hip abduction work in clinic today with low volume. Continued emphasis on progressive gluteal and LE closed-chain strengthening. She is awaiting hip injection - scheduled for next Wednesday 10/14/21.  Pt has remaining deficits in hip and LE strength, hip ROM, gait deviations, LLE weight shift/weight acceptance, and L iliolumbar/gluteal/groin pain. Patient will benefit from continued skilled PT intervention to address the above impairments and activity limitations for best return to PLOF.    Personal Factors and Comorbidities Age;Comorbidity 3+;Time since onset of injury/illness/exacerbation    Comorbidities CAD, acid reflux, s/p TIA    Examination-Activity Limitations Bed Mobility;Stairs;Stand;Dressing;Bathing;Bend;Locomotion Level;Squat    Examination-Participation Restrictions Community Activity;Cleaning;Laundry    Stability/Clinical Decision Making  Evolving/Moderate complexity    Rehab Potential Good    PT Frequency 2x / week    PT Duration 8 weeks    PT Treatment/Interventions ADLs/Self Care Home Management    PT Next Visit Plan Increasing emphasis on progressing intensity of unloaded isotonics with progressive weightbearing and CKC strengthening as tolerated. MT to modulate symptoms and improve tolerance of active drills in clinic prn. Recommend continued PT 2x/week for 4 weeks    PT Home Exercise Plan Access Code [phone].             Patient will benefit from skilled therapeutic intervention in order to improve the following deficits and impairments:  Abnormal gait, Hypomobility, Difficulty walking, Decreased strength, Pain, Decreased range of motion  Visit Diagnosis: Pain in left hip  Difficulty in walking, not elsewhere classified  Muscle weakness (generalized)  Stiffness of left hip, not elsewhere classified     Problem List Patient Active Problem List   Diagnosis Date Noted   Unstable angina ([place]) 01/19/2017   CAD (coronary artery disease) 01/19/2017   Valentina Gu, PT, DPT #[id number]  Eilleen Kempf, PT 10/09/2021, 8:18 AM  [place] [facility] [address] [place], [place], [address] Phone: [phone]   Fax:  [phone]  Name: Kristen Lloyd MRN: [id number] Date of Birth: 1941/05/02

## 2021-10-13 ENCOUNTER — Other Ambulatory Visit: Payer: Self-pay

## 2021-10-13 ENCOUNTER — Ambulatory Visit: Payer: Medicare Other | Admitting: Physical Therapy

## 2021-10-13 DIAGNOSIS — M6281 Muscle weakness (generalized): Secondary | ICD-10-CM

## 2021-10-13 DIAGNOSIS — M25552 Pain in left hip: Secondary | ICD-10-CM

## 2021-10-13 DIAGNOSIS — R262 Difficulty in walking, not elsewhere classified: Secondary | ICD-10-CM

## 2021-10-13 DIAGNOSIS — M25652 Stiffness of left hip, not elsewhere classified: Secondary | ICD-10-CM

## 2021-10-13 NOTE — Therapy (Signed)
[facility] [address] [place], [place], [address] Phone: [phone]   Fax:  [phone]  Physical Therapy Treatment  Patient Details  Name: Kristen Lloyd MRN: [id number] Date of Birth: [birth year] Referring Provider (PT): Allene Dillon, FNP   Encounter Date: 10/13/2021   PT End of Session - 10/13/21 1559     Visit Number 29    Number of Visits 35    Date for PT Re-Evaluation 08/11/21    Authorization Time Period recert [id number]/95-28/41/32    Progress Note Due on Visit 65    PT Start Time 1556    PT Stop Time 1643    PT Time Calculation (min) 47 min    Activity Tolerance Patient tolerated treatment well;Patient limited by pain    Behavior During Therapy [facility] for tasks assessed/performed             Past Medical History:  Diagnosis Date   Acid reflux    Coronary artery disease    Diverticulitis    Stroke [facility])     Past Surgical History:  Procedure Laterality Date   ABDOMINAL HYSTERECTOMY     BREAST BIOPSY Left 1990's   lt bx x 2-neg   CARDIAC CATHETERIZATION N/A 01/19/2017   Procedure: Left Heart Cath and Coronary Angiography;  Surgeon: Teodoro Spray, MD;  Location: [place] [facility];  Service: Cardiovascular;  Laterality: N/A;   CARDIAC CATHETERIZATION N/A 01/19/2017   Procedure: Coronary Stent Intervention;  Surgeon: Isaias Cowman, MD;  Location: [place] [facility];  Service: Cardiovascular;  Laterality: N/A;   CARDIAC SURGERY     Ileostomy and reversal N/A 2021   TONSILLECTOMY      There were no vitals filed for this visit.   Subjective Assessment - 10/13/21 1557     Subjective Patient reports tolerating last session well with progression of exercise. Patient reports not using Tramadol much over the last month, but she did use it one time this previous weekend. Patient reports her L lateral hip and L knee has hurt more since the weekend. She denies significant discomfort at night. Patient was  able to resume her home exercises. 6/10 pain at arrival to PT. Patient feels that more cold weather may be aggravating her hip/knee.    Pertinent History Patient is an 80 year old female with primary complaint of L hip pain. Patient has referring diagnosis of L hip OA. Patient reports about 2 years of hip pain. She reports she was doing senior fitness program at [facility] at the time her symptoms began. She states that the staff noticed a change in her gait pattern and she felt minmal pain. This has since worsened. Patient states she was very sick last year and didn't do as much exercise at the time. Patient had ileostomy and reversal in 2021 with significant GI symptoms - significant recovery time required and patient had notably decreased physical activity. Patient did have steroid injection 05/19/2021 that did provide moderate relief. She feels that later in the day she is getting pain into thoracolumbar region from the effect her condition has on her walking. She states that during the day it "isn't that bad." Patient reports no night pain. Pain is worse in AM. Hx of TIA in 2015. No numbness or tingling. Patient stated goals: to avoid surgery and to walk better.    Limitations Standing;Walking;House hold activities    How long can you stand comfortably? 10 minutes    How long can you walk  comfortably? 30-60 min in store without significant increase in pain    Diagnostic tests L hip radiographs; evidence of OA/degenerative change    Patient Stated Goals To avoid surgery, able to walk better                 Treatment Performed     NuStep x 5 minutes, Level 4; seat 6, arms 7 - unbilled time     TREATMENT     Manual Therapy - for joint and soft tissue mobility, symptom modulation, hip ROM   L hip PROM within pt tolerance   STM /DTM and TPR at L deep hip ERs in sidelying   STM/IASTM with Theraband roller along rectus femoris and vastus lateralis, adductor longus and magnus        *not today*  Bilateral lower extremity long axis distraction in supine, intermittent [aggravating after 4 min, D/C] L hip long-axis distraction in supine for axial decompression of LLE weightbearing joints Attempted inferior mobilization in 90 deg hip flexion (no belt) - stopped due to intolerance of direct pressure on proximal thigh STM along adductor magnus L hip gentle adductor stretch; 3x30sec L hip posterior mobilization (axial load through femur) with conjunct passive ER/IR within pt tolerance L hip lateral distraction with Mulligan belt, intermittent L hip MWM (c belt distraction) with hip flexion        Therapeutic exercise - for improved strength as needed for ability to perform transferring, weightbearing activity, obstacle negotiation; for soft tissue mobility and ROM   Lower trunk rotations; x10 alternating   Sidelying hip abduction; 2x8   Minisquat, with table behind patient; 2x10, with 6-lb Med ball goblet hold, *slow eccentric*       *not today* Sidelying clamshell; 2x10, Green Tband 3-way Hip, standing with bilat UE support; x10 ea dir, bilat, with 3-lb cuff weight  Patient education: discussed HEP modification for butterfly stretch, bent-knee fallout [employer], blue swiss ball; 3-way (forward,  L and R diagonal); x5 ea dir Supine hip abduction AROM; x20 Seated knee extension; 2x10, bilateral; 7.5-lb cuff weight Butterfly stretch; 3x30sec, hooklying Open book; x10 ea side SLR; 2x10 Glute bridge; 2x10 Butterfly stretch; 3x30sec, hooklying         Neuromuscular Re-education - for movement control and trunk stabilization, targeting cueing for gluteal activation     Sidestep with Red Tband at distal shins; 4x down/back, length of // bars   Toe tap on 6-inch step; 2x10 alternating with intermittent touch on // bars; 1x10 with no UE support, standing on Airex pad        *not today* Forward step up to hurdle step, 6-inch step on staircase; R  handrail UE support; 1x8 Hurdle stepping, 3 6-inch hurdles; 4x down/back length of // bars Tandem stance; 1x30 seconds, bilaterally  Tandem stance on foam; 1x30seconds, bilaterally Clamshell; 2x10, R sidelying [verbal cueing, tactile cueing, and demo for technique and to prevent trunk rotation compensation] Standing march; x10 alternating with unilateral table support  Pelvic tilt; 2x10, hooklying, anterior/posterior [minimal verbal and tactile cueing]         ASSESSMENT Patient tolerates modest progression of gluteus medius strengthening well today with notable fatigue following performance of sidelying hip abduction. She exhibits improved gait pattern following manual therapy and non-weightbearing exercise today, though decreased stance time/antalgic pattern is still present. She reports feeling better at end of session today and is able limit compensatory weight shift to RLE during toe tapping and minisquat activity with mirror feedback. She is awaiting  hip injection - scheduled for Wednesday 10/14/21.  Pt has remaining deficits in hip and LE strength, hip ROM, gait deviations, LLE weight shift/weight acceptance, and L iliolumbar/gluteal/groin pain. Patient will benefit from continued skilled PT intervention to address the above impairments and activity limitations for best return to PLOF.         PT Short Term Goals - 08/11/21 1647       PT SHORT TERM GOAL #1   Title Pt will be independent and 100% compliant with established HEP and activity modification as needed to augment PT intervention and improve pertinent strength and mobility deficits as needed for best return to prior level of function.    Baseline HEP provided at IE 06/15/21. 07/16/21: compliant and independent with exercise technique    Time 3    Period Weeks    Status Achieved    Target Date 07/06/21      PT SHORT TERM GOAL #2   Title Patient will perform independent sit to stand without UE support and no increase in pain  indicative of improved functional LE strength and ability to perform transferring necessary for accessing home and community environment    Baseline IE: Significant difficulty with sit to stand with heavy UE support required. 07/16/21: performed with moderate L hip pain c weightbearing. 08/11/21: performed without increase in pain    Time 4    Period Weeks    Status Achieved    Target Date 07/30/21               PT Long Term Goals - 10/06/21 1611       PT LONG TERM GOAL #1   Title Patient will demonstrate improved function as evidenced by a score of 59 on FOTO measure for full participation in activities at home and in the community.    Baseline 06/15/21: 50. 07/15/21: 49. 08/12/21: 49. 08/27/21: 47.   10/06/21: 52.    Time 8    Period Weeks    Status Not Met    Target Date 10/29/21      PT LONG TERM GOAL #2   Title Patient will perform consecutive hurdle step over 3 6-inch hurdles with good heel to toe progression, no LOB, no buckling off affected LE, and no increase in pain indicative of improved ability to weight shift to affected LE and clear lower limb over obstacles as needed for functional mobility    Baseline IE: Antalgic gait pattern wth decreased LLE weight shift. 07/15/21: able to perform dynamic march in // bars with sound weight shift. 07/28/21: performed in parallel bars with no LOB and no net increase in pain/sypmtoms    Time 8    Period Weeks    Status Achieved    Target Date 08/10/21      PT LONG TERM GOAL #3   Title Patient will improve MMT to 4+/5 for all tested LE musculature indicative of improved strength as needed for improved performance of transferring and as needed to produce power to prevent LOB during episode of large postural perturbation    Baseline IE: MMT 3+ to 4/5 for hip musculature (flexion, ER, IR, ABD), 4/5 for hamstrings and ankle dorsiflexors. 07/15/21: Not met for ER/IR/ABD. otherwise 4+ or greater. 08/11/21: Not met for hip ER/IR/ABD. 08/27/21: Not met  for hip ER/IR/ABD.    10/06/21: Not met for L hip ER/IR/ABD, modest improvement from previous progress note.    Time 8    Period Weeks    Status Partially Met  Target Date 10/29/21      PT LONG TERM GOAL #4   Title Patient will ambulate x 300 feet in clinic with no AD with symmetrical weight shift and normal heel to toe progression without LOB and no reproduction of pain indicative of improved community-level gait    Baseline IE: Gait with antalgic pattern and decreased stance time LLE. 07/15/21: improving weight shift to LLE, though pt still demonstrates moderate antalgic pattern that is worse following prolonged sitting. 08/11/21: Intermittent improved weight shift, though antalgic pattern is exacerbated after period of sitting/immobility. 08/27/21: pt demonstrates good heel to toe progression and normal stride, but decreased L weight shift/antalgic pattern is present.   10/06/21: Pt is able to ambulate 310 feet in clinic with noted gait deviations, LLE antalgic pattern.    Time 8    Period Weeks    Status Partially Met    Target Date 10/29/21                   Plan - 10/14/21 1856     Clinical Impression Statement Patient tolerates modest progression of gluteus medius strengthening well today with notable fatigue following performance of sidelying hip abduction. She exhibits improved gait pattern following manual therapy and non-weightbearing exercise today, though decreased stance time/antalgic pattern is still present. She reports feeling better at end of session today and is able limit compensatory weight shift to RLE during toe tapping and minisquat activity with mirror feedback. She is awaiting hip injection - scheduled for Wednesday 10/14/21.  Pt has remaining deficits in hip and LE strength, hip ROM, gait deviations, LLE weight shift/weight acceptance, and L iliolumbar/gluteal/groin pain. Patient will benefit from continued skilled PT intervention to address the above impairments  and activity limitations for best return to PLOF.    Personal Factors and Comorbidities Age;Comorbidity 3+;Time since onset of injury/illness/exacerbation    Comorbidities CAD, acid reflux, s/p TIA    Examination-Activity Limitations Bed Mobility;Stairs;Stand;Dressing;Bathing;Bend;Locomotion Level;Squat    Examination-Participation Restrictions Community Activity;Cleaning;Laundry    Stability/Clinical Decision Making Evolving/Moderate complexity    Rehab Potential Good    PT Frequency 2x / week    PT Duration 8 weeks    PT Treatment/Interventions ADLs/Self Care Home Management    PT Next Visit Plan Increasing emphasis on progressing intensity of unloaded isotonics with progressive weightbearing and CKC strengthening as tolerated. MT to modulate symptoms and improve tolerance of active drills in clinic prn. Recommend continued PT 2x/week for 4 weeks    PT Home Exercise Plan Access Code [phone].             Patient will benefit from skilled therapeutic intervention in order to improve the following deficits and impairments:  Abnormal gait, Hypomobility, Difficulty walking, Decreased strength, Pain, Decreased range of motion  Visit Diagnosis: Pain in left hip  Difficulty in walking, not elsewhere classified  Muscle weakness (generalized)  Stiffness of left hip, not elsewhere classified     Problem List Patient Active Problem List   Diagnosis Date Noted   Unstable angina ([place]) 01/19/2017   CAD (coronary artery disease) 01/19/2017   Valentina Gu, PT, DPT #[id number]  Eilleen Kempf, PT 10/14/2021, 6:56 PM  [facility] [address] [place], [place], [address] Phone: [phone]   Fax:  [phone]  Name: BREANE GRUNWALD MRN: [id number] Date of Birth: Jun 17, 1941

## 2021-10-14 ENCOUNTER — Encounter: Payer: Self-pay | Admitting: Physical Therapy

## 2021-10-15 ENCOUNTER — Encounter: Payer: Medicare Other | Admitting: Physical Therapy

## 2021-10-20 ENCOUNTER — Other Ambulatory Visit: Payer: Self-pay

## 2021-10-20 ENCOUNTER — Ambulatory Visit: Payer: Medicare Other | Admitting: Physical Therapy

## 2021-10-20 DIAGNOSIS — M25652 Stiffness of left hip, not elsewhere classified: Secondary | ICD-10-CM

## 2021-10-20 DIAGNOSIS — M25552 Pain in left hip: Secondary | ICD-10-CM | POA: Diagnosis not present

## 2021-10-20 DIAGNOSIS — R262 Difficulty in walking, not elsewhere classified: Secondary | ICD-10-CM

## 2021-10-20 DIAGNOSIS — M6281 Muscle weakness (generalized): Secondary | ICD-10-CM

## 2021-10-20 NOTE — Therapy (Signed)
Kristen Lloyd, Kristen Lloyd, Kristen Lloyd  Physical Therapy Treatment  Patient Details  Name: Kristen Lloyd MRN: [id number] Date of Birth: [birth year] Referring Provider (PT): Allene Dillon, FNP   Encounter Date: 10/20/2021   PT End of Session - 10/20/21 1759     Visit Number 30    Number of Visits 35    Date for PT Re-Evaluation 08/11/21    Authorization Time Period recert 07/07/18-75/88/32    Progress Note Due on Visit 90    PT Start Time 1605    PT Stop Time 5498    PT Time Calculation (min) 42 min    Activity Tolerance Patient tolerated treatment well;Patient limited by pain    Behavior During Therapy [facility] for tasks assessed/performed             Past Medical History:  Diagnosis Date   Acid reflux    Coronary artery disease    Diverticulitis    Stroke [facility])     Past Surgical History:  Procedure Laterality Date   ABDOMINAL HYSTERECTOMY     BREAST BIOPSY Left 1990's   lt bx x 2-neg   CARDIAC CATHETERIZATION N/A 01/19/2017   Procedure: Left Heart Cath and Coronary Angiography;  Surgeon: Teodoro Spray, MD;  Location: [place] [facility];  Service: Cardiovascular;  Laterality: N/A;   CARDIAC CATHETERIZATION N/A 01/19/2017   Procedure: Coronary Stent Intervention;  Surgeon: Isaias Cowman, MD;  Location: [place] [facility];  Service: Cardiovascular;  Laterality: N/A;   CARDIAC SURGERY     Ileostomy and reversal N/A 2021   TONSILLECTOMY      There were no vitals filed for this visit.   Subjective Assessment - 10/20/21 1608     Subjective Patient had injection last Wednesday. She is concerned that injection may not help. She reports discomfort grossly around groin, lateral hip, and proximal thigh with weightbearing; she feels that injection may have helped with intensity of symptoms (though more time may be needed for full effect of steroid injection). Dr.  Sharlet Salina discussed consultation with orthopedic surgeon. Patient reports mild stiffness in her L groin area.    Pertinent History Patient is an 80 year old female with primary complaint of L hip pain. Patient has referring diagnosis of L hip OA. Patient reports about 2 years of hip pain. She reports she was doing senior fitness program at [facility] at the time her symptoms began. She states that the staff noticed a change in her gait pattern and she felt minmal pain. This has since worsened. Patient states she was very sick last year and didn't do as much exercise at the time. Patient had ileostomy and reversal in 2021 with significant GI symptoms - significant recovery time required and patient had notably decreased physical activity. Patient did have steroid injection 05/19/2021 that did provide moderate relief. She feels that later in the day she is getting pain into thoracolumbar region from the effect her condition has on her walking. She states that during the day it "isn't that bad." Patient reports no night pain. Pain is worse in AM. Hx of TIA in 2015. No numbness or tingling. Patient stated goals: to avoid surgery and to walk better.    Limitations Standing;Walking;House hold activities    How long can you stand comfortably? 10 minutes    How long can you walk comfortably? 30-60 min in store without significant increase in pain  Diagnostic tests L hip radiographs; evidence of OA/degenerative change    Patient Stated Goals To avoid surgery, able to walk better               Treatment Performed     NuStep x 5 minutes, Level 4; seat 6, arms 7 - unbilled time     TREATMENT     Manual Therapy - for joint and soft tissue mobility, symptom modulation, hip ROM   L hip PROM within pt tolerance  L hip long-axis distraction in supine for axial decompression of LLE weightbearing joints   DTM and TPR along distal iliopsoas   Passive L hip flexor stretching in R sidelying;  2x30sec     *not today*  STM/IASTM with Theraband roller along rectus femoris and vastus lateralis, adductor longus and magnus Bilateral lower extremity long axis distraction in supine, intermittent [aggravating after 4 min, D/C] Attempted inferior mobilization in 90 deg hip flexion (no belt) - stopped due to intolerance of direct pressure on proximal thigh STM along adductor magnus L hip gentle adductor stretch; 3x30sec L hip posterior mobilization (axial load through femur) with conjunct passive ER/IR within pt tolerance L hip lateral distraction with Mulligan belt, intermittent L hip MWM (c belt distraction) with hip flexion        Therapeutic exercise - for improved strength as needed for ability to perform transferring, weightbearing activity, obstacle negotiation; for soft tissue mobility and ROM    Sidelying hip abduction; 2x8   3-way Hip, standing with bilat UE support; x10 ea dir, bilat, with 3-lb cuff weight   Glute bridge; 2x10, with Blue Tband above knees     *not today* Minisquat, with table behind patient; 2x10, with 6-lb Med ball goblet hold, *slow eccentric* Sidelying clamshell; 2x10, Green Tband Lower trunk rotations; x10 alternating Patient education: discussed HEP modification for butterfly stretch, bent-knee fallout [employer], blue swiss ball; 3-way (forward,  L and R diagonal); x5 ea dir Supine hip abduction AROM; x20 Seated knee extension; 2x10, bilateral; 7.5-lb cuff weight Butterfly stretch; 3x30sec, hooklying Open book; x10 ea side SLR; 2x10  Butterfly stretch; 3x30sec, hooklying         Neuromuscular Re-education - for movement control and trunk stabilization, targeting cueing for gluteal activation     Sidestep with Red Tband at distal shins; 4x down/back, length of // bars   Toe tap on 6-inch step; 2x10 alternating with intermittent touch on // bars; 1x10 with no UE support, standing on Airex pad         *not today* Forward  step up to hurdle step, 6-inch step on staircase; R handrail UE support; 1x8 Hurdle stepping, 3 6-inch hurdles; 4x down/back length of // bars Tandem stance; 1x30 seconds, bilaterally  Tandem stance on foam; 1x30seconds, bilaterally Clamshell; 2x10, R sidelying [verbal cueing, tactile cueing, and demo for technique and to prevent trunk rotation compensation] Standing march; x10 alternating with unilateral table support  Pelvic tilt; 2x10, hooklying, anterior/posterior [minimal verbal and tactile cueing]         ASSESSMENT Patient feels that her injection may not help; poor pre-treatment expectations can potentially create nocebo effect and limit progress and results from injection. Patient has been referred to orthopedist for consultation given recalcitrant L hip/lower quarter pain and mobility deficits. Symptoms are mitigated with use of manual therapy, but she does have pain consistently with weightbearing activity. Patient exhibits excellent weight shift to L lower limb during toe tapping activity on Airex. Patient has made fair  progress to date with strength and ability to perform functional activities, but she still has similar complaints of pain with ADLs. Pt has remaining deficits in hip and LE strength, hip ROM, gait deviations, LLE weight shift/weight acceptance, and L iliolumbar/gluteal/groin pain. Patient will benefit from continued skilled PT intervention to address the above impairments and activity limitations for best return to PLOF.     PT Short Term Goals - 08/11/21 1647       PT SHORT TERM GOAL #1   Title Pt will be independent and 100% compliant with established HEP and activity modification as needed to augment PT intervention and improve pertinent strength and mobility deficits as needed for best return to prior level of function.    Baseline HEP provided at IE 06/15/21. 07/16/21: compliant and independent with exercise technique    Time 3    Period Weeks    Status Achieved     Target Date 07/06/21      PT SHORT TERM GOAL #2   Title Patient will perform independent sit to stand without UE support and no increase in pain indicative of improved functional LE strength and ability to perform transferring necessary for accessing home and community environment    Baseline IE: Significant difficulty with sit to stand with heavy UE support required. 07/16/21: performed with moderate L hip pain c weightbearing. 08/11/21: performed without increase in pain    Time 4    Period Weeks    Status Achieved    Target Date 07/30/21               PT Long Term Goals - 10/06/21 1611       PT LONG TERM GOAL #1   Title Patient will demonstrate improved function as evidenced by a score of 59 on FOTO measure for full participation in activities at home and in the community.    Baseline 06/15/21: 50. 07/15/21: 49. 08/12/21: 49. 08/27/21: 47.   10/06/21: 52.    Time 8    Period Weeks    Status Not Met    Target Date 10/29/21      PT LONG TERM GOAL #2   Title Patient will perform consecutive hurdle step over 3 6-inch hurdles with good heel to toe progression, no LOB, no buckling off affected LE, and no increase in pain indicative of improved ability to weight shift to affected LE and clear lower limb over obstacles as needed for functional mobility    Baseline IE: Antalgic gait pattern wth decreased LLE weight shift. 07/15/21: able to perform dynamic march in // bars with sound weight shift. 07/28/21: performed in parallel bars with no LOB and no net increase in pain/sypmtoms    Time 8    Period Weeks    Status Achieved    Target Date 08/10/21      PT LONG TERM GOAL #3   Title Patient will improve MMT to 4+/5 for all tested LE musculature indicative of improved strength as needed for improved performance of transferring and as needed to produce power to prevent LOB during episode of large postural perturbation    Baseline IE: MMT 3+ to 4/5 for hip musculature (flexion, ER, IR, ABD), 4/5  for hamstrings and ankle dorsiflexors. 07/15/21: Not met for ER/IR/ABD. otherwise 4+ or greater. 08/11/21: Not met for hip ER/IR/ABD. 08/27/21: Not met for hip ER/IR/ABD.    10/06/21: Not met for L hip ER/IR/ABD, modest improvement from previous progress note.    Time 8  Period Weeks    Status Partially Met    Target Date 10/29/21      PT LONG TERM GOAL #4   Title Patient will ambulate x 300 feet in clinic with no AD with symmetrical weight shift and normal heel to toe progression without LOB and no reproduction of pain indicative of improved community-level gait    Baseline IE: Gait with antalgic pattern and decreased stance time LLE. 07/15/21: improving weight shift to LLE, though pt still demonstrates moderate antalgic pattern that is worse following prolonged sitting. 08/11/21: Intermittent improved weight shift, though antalgic pattern is exacerbated after period of sitting/immobility. 08/27/21: pt demonstrates good heel to toe progression and normal stride, but decreased L weight shift/antalgic pattern is present.   10/06/21: Pt is able to ambulate 310 feet in clinic with noted gait deviations, LLE antalgic pattern.    Time 8    Period Weeks    Status Partially Met    Target Date 10/29/21                   Plan - 10/21/21 1544     Clinical Impression Statement Patient feels that her injection may not help; poor pre-treatment expectations can potentially create nocebo effect and limit progress and results from injection. Patient has been referred to orthopedist for consultation given recalcitrant L hip/lower quarter pain and mobility deficits. Symptoms are mitigated with use of manual therapy, but she does have pain consistently with weightbearing activity. Patient exhibits excellent weight shift to L lower limb during toe tapping activity on Airex. Patient has made fair progress to date with strength and ability to perform functional activities, but she still has similar complaints of pain  with ADLs. Pt has remaining deficits in hip and LE strength, hip ROM, gait deviations, LLE weight shift/weight acceptance, and L iliolumbar/gluteal/groin pain. Patient will benefit from continued skilled PT intervention to address the above impairments and activity limitations for best return to PLOF.    Personal Factors and Comorbidities Age;Comorbidity 3+;Time since onset of injury/illness/exacerbation    Comorbidities CAD, acid reflux, s/p TIA    Examination-Activity Limitations Bed Mobility;Stairs;Stand;Dressing;Bathing;Bend;Locomotion Level;Squat    Examination-Participation Restrictions Community Activity;Cleaning;Laundry    Stability/Clinical Decision Making Evolving/Moderate complexity    Rehab Potential Good    PT Frequency 2x / week    PT Duration 8 weeks    PT Treatment/Interventions ADLs/Self Care Home Management    PT Next Visit Plan Increasing emphasis on progressing intensity of unloaded isotonics with progressive weightbearing and CKC strengthening as tolerated. MT to modulate symptoms and improve tolerance of active drills in clinic prn. Recommend continued PT 2x/week for 4 weeks    PT Home Exercise Plan Access Code [phone].             Patient will benefit from skilled therapeutic intervention in order to improve the following deficits and impairments:  Abnormal gait, Hypomobility, Difficulty walking, Decreased strength, Pain, Decreased range of motion  Visit Diagnosis: Pain in left hip  Difficulty in walking, not elsewhere classified  Muscle weakness (generalized)  Stiffness of left hip, not elsewhere classified     Problem List Patient Active Problem List   Diagnosis Date Noted   Unstable angina ([place]) 01/19/2017   CAD (coronary artery disease) 01/19/2017   Valentina Gu, PT, DPT #[id number]  Eilleen Kempf, PT 10/21/2021, 3:44 PM  [facility] [address] [place], Kristen Lloyd, [address] Phone: [phone]    Fax:  [phone]  Name:  AMELIANNA MELLER MRN: [id number] Date of Birth: 1941-10-14

## 2021-10-21 ENCOUNTER — Encounter: Payer: Self-pay | Admitting: Physical Therapy

## 2021-10-22 ENCOUNTER — Ambulatory Visit: Payer: Medicare Other | Admitting: Physical Therapy

## 2021-10-22 ENCOUNTER — Other Ambulatory Visit: Payer: Self-pay

## 2021-10-22 DIAGNOSIS — R262 Difficulty in walking, not elsewhere classified: Secondary | ICD-10-CM

## 2021-10-22 DIAGNOSIS — M25552 Pain in left hip: Secondary | ICD-10-CM | POA: Diagnosis not present

## 2021-10-22 DIAGNOSIS — M6281 Muscle weakness (generalized): Secondary | ICD-10-CM

## 2021-10-22 DIAGNOSIS — M25652 Stiffness of left hip, not elsewhere classified: Secondary | ICD-10-CM

## 2021-10-22 NOTE — Therapy (Signed)
[facility] [address] [place], [place], [address] Phone: [phone]   Fax:  [phone]  Physical Therapy Treatment  Patient Details  Name: Kristen Lloyd MRN: [id number] Date of Birth: 1941-02-18 Referring Provider (PT): Allene Dillon, FNP   Encounter Date: 10/22/2021   PT End of Session - 10/26/21 0559     Visit Number 31    Number of Visits 35    Date for PT Re-Evaluation 08/11/21    Authorization Time Period recert 46/65/99-35/70/17    Progress Note Due on Visit 18    PT Start Time 1558    PT Stop Time 1643    PT Time Calculation (min) 45 min    Activity Tolerance Patient tolerated treatment well;Patient limited by pain    Behavior During Therapy [facility] for tasks assessed/performed             Past Medical History:  Diagnosis Date   Acid reflux    Coronary artery disease    Diverticulitis    Stroke [facility])     Past Surgical History:  Procedure Laterality Date   ABDOMINAL HYSTERECTOMY     BREAST BIOPSY Left 1990's   lt bx x 2-neg   CARDIAC CATHETERIZATION N/A 01/19/2017   Procedure: Left Heart Cath and Coronary Angiography;  Surgeon: Teodoro Spray, MD;  Location: [facility];  Service: Cardiovascular;  Laterality: N/A;   CARDIAC CATHETERIZATION N/A 01/19/2017   Procedure: Coronary Stent Intervention;  Surgeon: Isaias Cowman, MD;  Location: [facility];  Service: Cardiovascular;  Laterality: N/A;   CARDIAC SURGERY     Ileostomy and reversal N/A 2021   TONSILLECTOMY      There were no vitals filed for this visit.   Subjective Assessment - 10/26/21 0559     Subjective Patient reports anterior thigh pain at arrival to PT. Patient states "I am not walking as good" this afternoon. She reports tolerating Tuesday's session well and reports doing well Tuesday evening. She states she was moving around the house a lot yesterday - completing laundry. Patient reports 5-6/10 pain at arrival  today.    Pertinent History Patient is an 80 year old female with primary complaint of L hip pain. Patient has referring diagnosis of L hip OA. Patient reports about 2 years of hip pain. She reports she was doing senior fitness program at [facility] [employer] at the time her symptoms began. She states that the staff noticed a change in her gait pattern and she felt minmal pain. This has since worsened. Patient states she was very sick last year and didn't do as much exercise at the time. Patient had ileostomy and reversal in 2021 with significant GI symptoms - significant recovery time required and patient had notably decreased physical activity. Patient did have steroid injection 05/19/2021 that did provide moderate relief. She feels that later in the day she is getting pain into thoracolumbar region from the effect her condition has on her walking. She states that during the day it "isn't that bad." Patient reports no night pain. Pain is worse in AM. Hx of TIA in 2015. No numbness or tingling. Patient stated goals: to avoid surgery and to walk better.    Limitations Standing;Walking;House hold activities    How long can you stand comfortably? 10 minutes    How long can you walk comfortably? 30-60 min in store without significant increase in pain    Diagnostic tests L hip radiographs; evidence of OA/degenerative change  Patient Stated Goals To avoid surgery, able to walk better               Treatment Performed     NuStep x 5 minutes, Level 4; seat 6, arms 7 - unbilled time     TREATMENT     Manual Therapy - for joint and soft tissue mobility, symptom modulation, hip ROM   L hip PROM within pt tolerance  STM/IASTM with Theraband roller along rectus femoris, vastus lateralis, and TFL/hip flexor region   Passive L hip flexor stretching in R sidelying; 2x30sec       *not today*  L hip long-axis distraction in supine for axial decompression of LLE weightbearing joints Bilateral lower  extremity long axis distraction in supine, intermittent [aggravating after 4 min, D/C] Attempted inferior mobilization in 90 deg hip flexion (no belt) - stopped due to intolerance of direct pressure on proximal thigh STM along adductor magnus L hip gentle adductor stretch; 3x30sec L hip posterior mobilization (axial load through femur) with conjunct passive ER/IR within pt tolerance L hip lateral distraction with Mulligan belt, intermittent L hip MWM (c belt distraction) with hip flexion        Therapeutic exercise - for improved strength as needed for ability to perform transferring, weightbearing activity, obstacle negotiation; for soft tissue mobility and ROM     Sidelying hip abduction; 1x12, 1x10   3-way Hip, standing with bilat UE support; x10 ea dir, bilat, with 3-lb cuff weight    Glute bridge; 2x10, with Blue Tband above knees     *not today* Minisquat, with table behind patient; 2x10, with 6-lb Med ball goblet hold, *slow eccentric* Sidelying clamshell; 2x10, Green Tband Lower trunk rotations; x10 alternating Patient education: discussed HEP modification for butterfly stretch, bent-knee fallout [employer], blue swiss ball; 3-way (forward,  L and R diagonal); x5 ea dir Supine hip abduction AROM; x20 Seated knee extension; 2x10, bilateral; 7.5-lb cuff weight Butterfly stretch; 3x30sec, hooklying Open book; x10 ea side SLR; 2x10   Butterfly stretch; 3x30sec, hooklying         Neuromuscular Re-education - for movement control and trunk stabilization, targeting cueing for gluteal activation   Airex lateral step up/down, alternating from L to R side; 2x10 alternating    Dynamic march in // bars with mirror feedback; x2 D/B [c/o groin cramping and pain]            *not today* Sidestep with Red Tband at distal shins; 4x down/back, length of // bars Toe tap on 6-inch step; 2x10 alternating with intermittent touch on // bars; 1x10 with no UE support, standing  on Airex pad Forward step up to hurdle step, 6-inch step on staircase; R handrail UE support; 1x8 Hurdle stepping, 3 6-inch hurdles; 4x down/back length of // bars Tandem stance; 1x30 seconds, bilaterally  Tandem stance on foam; 1x30seconds, bilaterally Clamshell; 2x10, R sidelying [verbal cueing, tactile cueing, and demo for technique and to prevent trunk rotation compensation] Standing march; x10 alternating with unilateral table support  Pelvic tilt; 2x10, hooklying, anterior/posterior [minimal verbal and tactile cueing]      MHP (unbilled) utilized at end of treatment for analgesic effect and reduced cramping along L hip flexor/proximal quad region. x 5 minutes in hooklying with lower extremities supported on bolster       ASSESSMENT Patient has poor tolerance of dynamic marching at end of session and c/o cramping and anterior thigh/anterior groin discomfort with weight shifting toward end of the session today. She  tolerated Airex lateral stepping drill relatively well and did exhibit modestly improved gait pattern toward end of session; however, use of moist heat was necessary given persistent cramping with stepping and difficulty participating in weightbearing activity toward end of session. Subjective report of "cramping" is improved following use of MHP with lower limbs supported on bolster at end of session. Patient is awaiting follow-up with orthopedist; she is following up with referring provider in about 2 weeks. Pt has remaining deficits in hip and LE strength, hip ROM, gait deviations, LLE weight shift/weight acceptance, and L iliolumbar/gluteal/groin pain. Patient will benefit from continued skilled PT intervention to address the above impairments and activity limitations for best return to PLOF.         PT Short Term Goals - 08/11/21 1647       PT SHORT TERM GOAL #1   Title Pt will be independent and 100% compliant with established HEP and activity modification as needed to  augment PT intervention and improve pertinent strength and mobility deficits as needed for best return to prior level of function.    Baseline HEP provided at IE 06/15/21. 07/16/21: compliant and independent with exercise technique    Time 3    Period Weeks    Status Achieved    Target Date 07/06/21      PT SHORT TERM GOAL #2   Title Patient will perform independent sit to stand without UE support and no increase in pain indicative of improved functional LE strength and ability to perform transferring necessary for accessing home and community environment    Baseline IE: Significant difficulty with sit to stand with heavy UE support required. 07/16/21: performed with moderate L hip pain c weightbearing. 08/11/21: performed without increase in pain    Time 4    Period Weeks    Status Achieved    Target Date 07/30/21               PT Long Term Goals - 10/06/21 1611       PT LONG TERM GOAL #1   Title Patient will demonstrate improved function as evidenced by a score of 59 on FOTO measure for full participation in activities at home and in the community.    Baseline 06/15/21: 50. 07/15/21: 49. 08/12/21: 49. 08/27/21: 47.   10/06/21: 52.    Time 8    Period Weeks    Status Not Met    Target Date 10/29/21      PT LONG TERM GOAL #2   Title Patient will perform consecutive hurdle step over 3 6-inch hurdles with good heel to toe progression, no LOB, no buckling off affected LE, and no increase in pain indicative of improved ability to weight shift to affected LE and clear lower limb over obstacles as needed for functional mobility    Baseline IE: Antalgic gait pattern wth decreased LLE weight shift. 07/15/21: able to perform dynamic march in // bars with sound weight shift. 07/28/21: performed in parallel bars with no LOB and no net increase in pain/sypmtoms    Time 8    Period Weeks    Status Achieved    Target Date 08/10/21      PT LONG TERM GOAL #3   Title Patient will improve MMT to 4+/5 for  all tested LE musculature indicative of improved strength as needed for improved performance of transferring and as needed to produce power to prevent LOB during episode of large postural perturbation    Baseline IE: MMT 3+  to 4/5 for hip musculature (flexion, ER, IR, ABD), 4/5 for hamstrings and ankle dorsiflexors. 07/15/21: Not met for ER/IR/ABD. otherwise 4+ or greater. 08/11/21: Not met for hip ER/IR/ABD. 08/27/21: Not met for hip ER/IR/ABD.    10/06/21: Not met for L hip ER/IR/ABD, modest improvement from previous progress note.    Time 8    Period Weeks    Status Partially Met    Target Date 10/29/21      PT LONG TERM GOAL #4   Title Patient will ambulate x 300 feet in clinic with no AD with symmetrical weight shift and normal heel to toe progression without LOB and no reproduction of pain indicative of improved community-level gait    Baseline IE: Gait with antalgic pattern and decreased stance time LLE. 07/15/21: improving weight shift to LLE, though pt still demonstrates moderate antalgic pattern that is worse following prolonged sitting. 08/11/21: Intermittent improved weight shift, though antalgic pattern is exacerbated after period of sitting/immobility. 08/27/21: pt demonstrates good heel to toe progression and normal stride, but decreased L weight shift/antalgic pattern is present.   10/06/21: Pt is able to ambulate 310 feet in clinic with noted gait deviations, LLE antalgic pattern.    Time 8    Period Weeks    Status Partially Met    Target Date 10/29/21                   Plan - 10/26/21 0604     Clinical Impression Statement Patient has poor tolerance of dynamic marching at end of session and c/o cramping and anterior thigh/anterior groin discomfort with weight shifting toward end of the session today. She tolerated Airex lateral stepping drill relatively well and did exhibit modestly improved gait pattern toward end of session; however, use of moist heat was necessary given  persistent cramping with stepping and difficulty participating in weightbearing activity toward end of session. Subjective report of "cramping" is improved following use of MHP with lower limbs supported on bolster at end of session. Patient is awaiting follow-up with orthopedist; she is following up with referring provider in about 2 weeks. Pt has remaining deficits in hip and LE strength, hip ROM, gait deviations, LLE weight shift/weight acceptance, and L iliolumbar/gluteal/groin pain. Patient will benefit from continued skilled PT intervention to address the above impairments and activity limitations for best return to PLOF.    Personal Factors and Comorbidities Age;Comorbidity 3+;Time since onset of injury/illness/exacerbation    Comorbidities CAD, acid reflux, s/p TIA    Examination-Activity Limitations Bed Mobility;Stairs;Stand;Dressing;Bathing;Bend;Locomotion Level;Squat    Examination-Participation Restrictions Community Activity;Cleaning;Laundry    Stability/Clinical Decision Making Evolving/Moderate complexity    Rehab Potential Good    PT Frequency 2x / week    PT Duration 8 weeks    PT Treatment/Interventions ADLs/Self Care Home Management    PT Next Visit Plan Increasing emphasis on progressing intensity of unloaded isotonics with progressive weightbearing and CKC strengthening as tolerated. MT to modulate symptoms and improve tolerance of active drills in clinic prn. Recommend continued PT 2x/week for 4 weeks    PT Home Exercise Plan Access Code [phone].             Patient will benefit from skilled therapeutic intervention in order to improve the following deficits and impairments:  Abnormal gait, Hypomobility, Difficulty walking, Decreased strength, Pain, Decreased range of motion  Visit Diagnosis: Pain in left hip  Difficulty in walking, not elsewhere classified  Muscle weakness (generalized)  Stiffness of left hip, not elsewhere classified  Problem List Patient  Active Problem List   Diagnosis Date Noted   Unstable angina ([place]) 01/19/2017   CAD (coronary artery disease) 01/19/2017   Valentina Gu, PT, DPT #[id number]  Eilleen Kempf, PT 10/26/2021, 6:05 AM  [facility] [address] [place], [place], [address] Phone: [phone]   Fax:  [phone]  Name: Kristen Lloyd MRN: [id number] Date of Birth: 09/21/1941

## 2021-10-26 ENCOUNTER — Encounter: Payer: Self-pay | Admitting: Physical Therapy

## 2021-10-29 ENCOUNTER — Ambulatory Visit: Payer: Medicare Other | Attending: Family Medicine | Admitting: Physical Therapy

## 2021-10-29 ENCOUNTER — Encounter: Payer: Self-pay | Admitting: Physical Therapy

## 2021-10-29 ENCOUNTER — Other Ambulatory Visit: Payer: Self-pay

## 2021-10-29 DIAGNOSIS — M25552 Pain in left hip: Secondary | ICD-10-CM | POA: Insufficient documentation

## 2021-10-29 DIAGNOSIS — R262 Difficulty in walking, not elsewhere classified: Secondary | ICD-10-CM | POA: Insufficient documentation

## 2021-10-29 DIAGNOSIS — M6281 Muscle weakness (generalized): Secondary | ICD-10-CM | POA: Insufficient documentation

## 2021-10-29 DIAGNOSIS — M25652 Stiffness of left hip, not elsewhere classified: Secondary | ICD-10-CM | POA: Insufficient documentation

## 2021-10-29 NOTE — Therapy (Signed)
[facility] [address] [place], [place], [address] Phone: [phone]   Fax:  [phone]  Physical Therapy Treatment  Patient Details  Name: Kristen Lloyd MRN: [id number] Date of Birth: 03-01-1941 Referring Provider (PT): Allene Dillon, FNP   Encounter Date: 10/29/2021   PT End of Session - 10/31/21 0959     Visit Number 32    Number of Visits 35    Date for PT Re-Evaluation 08/11/21    Authorization Time Period recert 26/71/24-58/09/98    Progress Note Due on Visit 44    PT Start Time 1515    PT Stop Time 1601    PT Time Calculation (min) 46 min    Activity Tolerance Patient tolerated treatment well;Patient limited by pain    Behavior During Therapy [facility] for tasks assessed/performed             Past Medical History:  Diagnosis Date   Acid reflux    Coronary artery disease    Diverticulitis    Stroke [facility])     Past Surgical History:  Procedure Laterality Date   ABDOMINAL HYSTERECTOMY     BREAST BIOPSY Left 1990's   lt bx x 2-neg   CARDIAC CATHETERIZATION N/A 01/19/2017   Procedure: Left Heart Cath and Coronary Angiography;  Surgeon: Teodoro Spray, MD;  Location: [place] [facility];  Service: Cardiovascular;  Laterality: N/A;   CARDIAC CATHETERIZATION N/A 01/19/2017   Procedure: Coronary Stent Intervention;  Surgeon: Isaias Cowman, MD;  Location: [place] [facility];  Service: Cardiovascular;  Laterality: N/A;   CARDIAC SURGERY     Ileostomy and reversal N/A 2021   TONSILLECTOMY      There were no vitals filed for this visit.   Subjective Assessment - 10/31/21 0959     Subjective Patient reports having mild pain at arrival to PT. She reports pain is affecting L proximal ITB/greater trochanter region at arrival to PT. She reports some days are better than others in regard to L hip pain. Patient is following up with orthopedist in January.    Pertinent History Patient is an 80 year old female with  primary complaint of L hip pain. Patient has referring diagnosis of L hip OA. Patient reports about 2 years of hip pain. She reports she was doing senior fitness program at [facility] at the time her symptoms began. She states that the staff noticed a change in her gait pattern and she felt minmal pain. This has since worsened. Patient states she was very sick last year and didn't do as much exercise at the time. Patient had ileostomy and reversal in 2021 with significant GI symptoms - significant recovery time required and patient had notably decreased physical activity. Patient did have steroid injection 05/19/2021 that did provide moderate relief. She feels that later in the day she is getting pain into thoracolumbar region from the effect her condition has on her walking. She states that during the day it "isn't that bad." Patient reports no night pain. Pain is worse in AM. Hx of TIA in 2015. No numbness or tingling. Patient stated goals: to avoid surgery and to walk better.    Limitations Standing;Walking;House hold activities    How long can you stand comfortably? 10 minutes    How long can you walk comfortably? 30-60 min in store without significant increase in pain    Diagnostic tests L hip radiographs; evidence of OA/degenerative change    Patient Stated Goals To avoid surgery,  able to walk better               Treatment Performed     NuStep x 5 minutes, Level 4; seat 6, arms 7 - unbilled time     TREATMENT     Manual Therapy - for joint and soft tissue mobility, symptom modulation, hip ROM   L hip PROM within pt tolerance   STM/IASTM with Theraband roller along rectus femoris, vastus lateralis, and TFL/hip flexor region   STM and IASTM with Hypervolt along L TFL, L gluteus minimus and medius       *not today*  Passive L hip flexor stretching in R sidelying; 2x30sec L hip long-axis distraction in supine for axial decompression of LLE weightbearing joints Bilateral  lower extremity long axis distraction in supine, intermittent [aggravating after 4 min, D/C] Attempted inferior mobilization in 90 deg hip flexion (no belt) - stopped due to intolerance of direct pressure on proximal thigh STM along adductor magnus L hip gentle adductor stretch; 3x30sec L hip posterior mobilization (axial load through femur) with conjunct passive ER/IR within pt tolerance        Therapeutic exercise - for improved strength as needed for ability to perform transferring, weightbearing activity, obstacle negotiation; for soft tissue mobility and ROM     Sidelying hip abduction; 1x12, 1x10   Lower trunk rotations; x10 alternating  3-way Hip, standing with bilat UE support in // bars; x10 ea dir, bilat, with 3-lb cuff weight      *not today* Glute bridge; 2x10, with Blue Tband above knees Minisquat, with table behind patient; 2x10, with 6-lb Med ball goblet hold, *slow eccentric* Sidelying clamshell; 2x10, Green [employer], blue swiss ball; 3-way (forward,  L and R diagonal); x5 ea dir Supine hip abduction AROM; x20 Seated knee extension; 2x10, bilateral; 7.5-lb cuff weight         Neuromuscular Re-education - for movement control and trunk stabilization, targeting cueing for gluteal activation   Airex lateral step up/down, alternating from L to R side; 1x10 alternating  [increase volume, 2x10 next visit]   Dynamic march in // bars with mirror feedback; x3 D/B         *not today* Sidestep with Red Tband at distal shins; 4x down/back, length of // bars Toe tap on 6-inch step; 2x10 alternating with intermittent touch on // bars; 1x10 with no UE support, standing on Airex pad Forward step up to hurdle step, 6-inch step on staircase; R handrail UE support; 1x8 Hurdle stepping, 3 6-inch hurdles; 4x down/back length of // bars Tandem stance; 1x30 seconds, bilaterally  Tandem stance on foam; 1x30seconds, bilaterally Standing march; x10 alternating with  unilateral table support          ASSESSMENT Patient has significantly improved tolerance of dynamic marching at end of session. Pt is awaiting referral to orthopedist per recommendation given by her referring provider. She has ongoing hip mobility deficits that are likely at a plateau. Pt does exhibit symmetrical IR between R and L lower limb, but she has ongoing significant decreased L hip ER (WFL R hip ER). PT is focusing on symptom modulation and progressing emphasis on strengthening and functional activities. Pt has been able to progress gluteal isotonics over the last 2 weeks and has tolerated this well. Pt has remaining deficits in hip and LE strength, hip ROM, gait deviations, LLE weight shift/weight acceptance, and L iliolumbar/gluteal/groin pain. Patient will benefit from continued skilled PT intervention to address the above impairments  and activity limitations for best return to PLOF.       PT Short Term Goals - 08/11/21 1647       PT SHORT TERM GOAL #1   Title Pt will be independent and 100% compliant with established HEP and activity modification as needed to augment PT intervention and improve pertinent strength and mobility deficits as needed for best return to prior level of function.    Baseline HEP provided at IE 06/15/21. 07/16/21: compliant and independent with exercise technique    Time 3    Period Weeks    Status Achieved    Target Date 07/06/21      PT SHORT TERM GOAL #2   Title Patient will perform independent sit to stand without UE support and no increase in pain indicative of improved functional LE strength and ability to perform transferring necessary for accessing home and community environment    Baseline IE: Significant difficulty with sit to stand with heavy UE support required. 07/16/21: performed with moderate L hip pain c weightbearing. 08/11/21: performed without increase in pain    Time 4    Period Weeks    Status Achieved    Target Date 07/30/21                PT Long Term Goals - 10/06/21 1611       PT LONG TERM GOAL #1   Title Patient will demonstrate improved function as evidenced by a score of 59 on FOTO measure for full participation in activities at home and in the community.    Baseline 06/15/21: 50. 07/15/21: 49. 08/12/21: 49. 08/27/21: 47.   10/06/21: 52.    Time 8    Period Weeks    Status Not Met    Target Date 10/29/21      PT LONG TERM GOAL #2   Title Patient will perform consecutive hurdle step over 3 6-inch hurdles with good heel to toe progression, no LOB, no buckling off affected LE, and no increase in pain indicative of improved ability to weight shift to affected LE and clear lower limb over obstacles as needed for functional mobility    Baseline IE: Antalgic gait pattern wth decreased LLE weight shift. 07/15/21: able to perform dynamic march in // bars with sound weight shift. 07/28/21: performed in parallel bars with no LOB and no net increase in pain/sypmtoms    Time 8    Period Weeks    Status Achieved    Target Date 08/10/21      PT LONG TERM GOAL #3   Title Patient will improve MMT to 4+/5 for all tested LE musculature indicative of improved strength as needed for improved performance of transferring and as needed to produce power to prevent LOB during episode of large postural perturbation    Baseline IE: MMT 3+ to 4/5 for hip musculature (flexion, ER, IR, ABD), 4/5 for hamstrings and ankle dorsiflexors. 07/15/21: Not met for ER/IR/ABD. otherwise 4+ or greater. 08/11/21: Not met for hip ER/IR/ABD. 08/27/21: Not met for hip ER/IR/ABD.    10/06/21: Not met for L hip ER/IR/ABD, modest improvement from previous progress note.    Time 8    Period Weeks    Status Partially Met    Target Date 10/29/21      PT LONG TERM GOAL #4   Title Patient will ambulate x 300 feet in clinic with no AD with symmetrical weight shift and normal heel to toe progression without LOB and no reproduction of  pain indicative of improved  community-level gait    Baseline IE: Gait with antalgic pattern and decreased stance time LLE. 07/15/21: improving weight shift to LLE, though pt still demonstrates moderate antalgic pattern that is worse following prolonged sitting. 08/11/21: Intermittent improved weight shift, though antalgic pattern is exacerbated after period of sitting/immobility. 08/27/21: pt demonstrates good heel to toe progression and normal stride, but decreased L weight shift/antalgic pattern is present.   10/06/21: Pt is able to ambulate 310 feet in clinic with noted gait deviations, LLE antalgic pattern.    Time 8    Period Weeks    Status Partially Met    Target Date 10/29/21                   Plan - 10/31/21 1006     Clinical Impression Statement Patient has significantly improved tolerance of dynamic marching at end of session. Pt is awaiting referral to orthopedist per recommendation given by her referring provider. She has ongoing hip mobility deficits that are likely at a plateau. Pt does exhibit symmetrical IR between R and L lower limb, but she has ongoing significant decreased L hip ER (WFL R hip ER). PT is focusing on symptom modulation and progressing emphasis on strengthening and functional activities. Pt has been able to progress gluteal isotonics over the last 2 weeks and has tolerated this well. Pt has remaining deficits in hip and LE strength, hip ROM, gait deviations, LLE weight shift/weight acceptance, and L iliolumbar/gluteal/groin pain. Patient will benefit from continued skilled PT intervention to address the above impairments and activity limitations for best return to PLOF.    Personal Factors and Comorbidities Age;Comorbidity 3+;Time since onset of injury/illness/exacerbation    Comorbidities CAD, acid reflux, s/p TIA    Examination-Activity Limitations Bed Mobility;Stairs;Stand;Dressing;Bathing;Bend;Locomotion Level;Squat    Examination-Participation Restrictions Community  Activity;Cleaning;Laundry    Stability/Clinical Decision Making Evolving/Moderate complexity    Rehab Potential Good    PT Frequency 2x / week    PT Duration 8 weeks    PT Treatment/Interventions ADLs/Self Care Home Management    PT Next Visit Plan Increasing emphasis on progressing intensity of unloaded isotonics with progressive weightbearing and CKC strengthening as tolerated. MT to modulate symptoms and improve tolerance of active drills in clinic prn. Recommend continued PT 2x/week for 4 weeks    PT Home Exercise Plan Access Code [phone].             Patient will benefit from skilled therapeutic intervention in order to improve the following deficits and impairments:  Abnormal gait, Hypomobility, Difficulty walking, Decreased strength, Pain, Decreased range of motion  Visit Diagnosis: Pain in left hip  Difficulty in walking, not elsewhere classified  Muscle weakness (generalized)  Stiffness of left hip, not elsewhere classified     Problem List Patient Active Problem List   Diagnosis Date Noted   Unstable angina ([employer]) 01/19/2017   CAD (coronary artery disease) 01/19/2017   Valentina Gu, PT, DPT #[id number]  Eilleen Kempf, PT 10/31/2021, 10:06 AM  [facility] [address] [place], [place], [address] Phone: [phone]   Fax:  [phone]  Name: Kristen Lloyd MRN: [id number] Date of Birth: 09-Aug-1941

## 2021-11-03 ENCOUNTER — Encounter: Payer: Self-pay | Admitting: Physical Therapy

## 2021-11-03 ENCOUNTER — Other Ambulatory Visit: Payer: Self-pay

## 2021-11-03 ENCOUNTER — Ambulatory Visit: Payer: Medicare Other | Admitting: Physical Therapy

## 2021-11-03 DIAGNOSIS — M25552 Pain in left hip: Secondary | ICD-10-CM | POA: Diagnosis not present

## 2021-11-03 DIAGNOSIS — R262 Difficulty in walking, not elsewhere classified: Secondary | ICD-10-CM

## 2021-11-03 DIAGNOSIS — M6281 Muscle weakness (generalized): Secondary | ICD-10-CM

## 2021-11-03 DIAGNOSIS — M25652 Stiffness of left hip, not elsewhere classified: Secondary | ICD-10-CM

## 2021-11-03 NOTE — Therapy (Signed)
[facility] [address] [place], [place], [address] Phone: [phone]   Fax:  [phone]  Physical Therapy Treatment  Patient Details  Name: Kristen Lloyd MRN: [id number] Date of Birth: 1941-03-14 Referring Provider (PT): Allene Dillon, FNP   Encounter Date: 11/03/2021   PT End of Session - 11/05/21 0718     Visit Number 33    Number of Visits 35    Date for PT Re-Evaluation 08/11/21    Authorization Time Period recert 68/15/94-70/76/15    Progress Note Due on Visit 63    PT Start Time 1522    PT Stop Time 1610    PT Time Calculation (min) 48 min    Activity Tolerance Patient tolerated treatment well;Patient limited by pain    Behavior During Therapy [facility] for tasks assessed/performed             Past Medical History:  Diagnosis Date   Acid reflux    Coronary artery disease    Diverticulitis    Stroke [facility])     Past Surgical History:  Procedure Laterality Date   ABDOMINAL HYSTERECTOMY     BREAST BIOPSY Left 1990's   lt bx x 2-neg   CARDIAC CATHETERIZATION N/A 01/19/2017   Procedure: Left Heart Cath and Coronary Angiography;  Surgeon: Teodoro Spray, MD;  Location: [place] [facility];  Service: Cardiovascular;  Laterality: N/A;   CARDIAC CATHETERIZATION N/A 01/19/2017   Procedure: Coronary Stent Intervention;  Surgeon: Isaias Cowman, MD;  Location: [place] [facility];  Service: Cardiovascular;  Laterality: N/A;   CARDIAC SURGERY     Ileostomy and reversal N/A 2021   TONSILLECTOMY      There were no vitals filed for this visit.   Subjective Assessment - 11/05/21 0718     Subjective Patient reports doing well today with walking in [place] and completing some errands in town. She reports getting stiff after prolonged sitting today following her errands. Patient reports compliance with her HEP. Patient reports no major issues after last visit.    Pertinent History Patient is an 80 year old female with  primary complaint of L hip pain. Patient has referring diagnosis of L hip OA. Patient reports about 2 years of hip pain. She reports she was doing senior fitness program at [facility] at the time her symptoms began. She states that the staff noticed a change in her gait pattern and she felt minmal pain. This has since worsened. Patient states she was very sick last year and didn't do as much exercise at the time. Patient had ileostomy and reversal in 2021 with significant GI symptoms - significant recovery time required and patient had notably decreased physical activity. Patient did have steroid injection 05/19/2021 that did provide moderate relief. She feels that later in the day she is getting pain into thoracolumbar region from the effect her condition has on her walking. She states that during the day it "isn't that bad." Patient reports no night pain. Pain is worse in AM. Hx of TIA in 2015. No numbness or tingling. Patient stated goals: to avoid surgery and to walk better.    Limitations Standing;Walking;House hold activities    How long can you stand comfortably? 10 minutes    How long can you walk comfortably? 30-60 min in store without significant increase in pain    Diagnostic tests L hip radiographs; evidence of OA/degenerative change    Patient Stated Goals To avoid surgery, able to walk better  Treatment Performed     NuStep x 5 minutes, Level 4; seat 6, arms 7 - unbilled time      TREATMENT     Manual Therapy - for joint and soft tissue mobility, symptom modulation, hip ROM   L hip PROM within pt tolerance   STM/IASTM with Theraband roller along rectus femoris, vastus lateralis, and TFL/hip flexor region  Passive L hip flexor stretching in R sidelying; 3x30sec     *not today*  STM and IASTM with Hypervolt along L TFL, L gluteus minimus and medius L hip long-axis distraction in supine for axial decompression of LLE weightbearing joints Bilateral  lower extremity long axis distraction in supine, intermittent [aggravating after 4 min, D/C] Attempted inferior mobilization in 90 deg hip flexion (no belt) - stopped due to intolerance of direct pressure on proximal thigh STM along adductor magnus L hip gentle adductor stretch; 3x30sec L hip posterior mobilization (axial load through femur) with conjunct passive ER/IR within pt tolerance        Therapeutic exercise - for improved strength as needed for ability to perform transferring, weightbearing activity, obstacle negotiation; for soft tissue mobility and ROM     Sidelying hip abduction; 1x12, 1x10   Glute bridge; 2x10, with Blue Tband above knees   3-way Hip, standing with bilat UE support in // bars; x10 ea dir, bilat, with 4-lb cuff weight      *not today* Lower trunk rotations; x10 alternating Minisquat, with table behind patient; 2x10, with 6-lb Med ball goblet hold, *slow eccentric* Sidelying clamshell; 2x10, Green [employer], blue swiss ball; 3-way (forward,  L and R diagonal); x5 ea dir Supine hip abduction AROM; x20 Seated knee extension; 2x10, bilateral; 7.5-lb cuff weight         Neuromuscular Re-education - for movement control and trunk stabilization, targeting cueing for gluteal activation   6-inch step lateral step up/down, alternating from L to R side; 2x10 alternating    Dynamic march in // bars with mirror feedback; x3 D/B     *not today* Sidestep with Red Tband at distal shins; 4x down/back, length of // bars Toe tap on 6-inch step; 2x10 alternating with intermittent touch on // bars; 1x10 with no UE support, standing on Airex pad Forward step up to hurdle step, 6-inch step on staircase; R handrail UE support; 1x8 Hurdle stepping, 3 6-inch hurdles; 4x down/back length of // bars Tandem stance; 1x30 seconds, bilaterally  Tandem stance on foam; 1x30seconds, bilaterally Standing march; x10 alternating with unilateral table support           ASSESSMENT Pt is awaiting referral to orthopedist per recommendation given by her referring provider - appointment set for January 2023. Patient is able to attain hip flexion WFL with mild discomfort and has IR symmetrical to contralateral lower limb; she is most limited in hip ER as needed for self-care tasks (e.g. FABER position for donning socks/shoes and for cleaning/hygiene). She exhibits ongoing decreased stance time on LLE that has been less pronounced with PT intervention and s/p injections, but this has been present throughout POC. Given previous trial of Mulligan mobilization, use of traction/long-leg distraction, and specific STM/DTM with ongoing pain with weightbearing, PT is focusing on function and strengthening at this time with use of manual techniques for symptom modulation to improve active participation in PT. Pt has remaining deficits in hip and LE strength, hip ROM, gait deviations, LLE weight shift/weight acceptance, and L iliolumbar/gluteal/groin pain. Patient will benefit from continued skilled  PT intervention to address the above impairments and activity limitations for best return to PLOF.        PT Short Term Goals - 08/11/21 1647       PT SHORT TERM GOAL #1   Title Pt will be independent and 100% compliant with established HEP and activity modification as needed to augment PT intervention and improve pertinent strength and mobility deficits as needed for best return to prior level of function.    Baseline HEP provided at IE 06/15/21. 07/16/21: compliant and independent with exercise technique    Time 3    Period Weeks    Status Achieved    Target Date 07/06/21      PT SHORT TERM GOAL #2   Title Patient will perform independent sit to stand without UE support and no increase in pain indicative of improved functional LE strength and ability to perform transferring necessary for accessing home and community environment    Baseline IE: Significant difficulty with sit to  stand with heavy UE support required. 07/16/21: performed with moderate L hip pain c weightbearing. 08/11/21: performed without increase in pain    Time 4    Period Weeks    Status Achieved    Target Date 07/30/21               PT Long Term Goals - 10/06/21 1611       PT LONG TERM GOAL #1   Title Patient will demonstrate improved function as evidenced by a score of 59 on FOTO measure for full participation in activities at home and in the community.    Baseline 06/15/21: 50. 07/15/21: 49. 08/12/21: 49. 08/27/21: 47.   10/06/21: 52.    Time 8    Period Weeks    Status Not Met    Target Date 10/29/21      PT LONG TERM GOAL #2   Title Patient will perform consecutive hurdle step over 3 6-inch hurdles with good heel to toe progression, no LOB, no buckling off affected LE, and no increase in pain indicative of improved ability to weight shift to affected LE and clear lower limb over obstacles as needed for functional mobility    Baseline IE: Antalgic gait pattern wth decreased LLE weight shift. 07/15/21: able to perform dynamic march in // bars with sound weight shift. 07/28/21: performed in parallel bars with no LOB and no net increase in pain/sypmtoms    Time 8    Period Weeks    Status Achieved    Target Date 08/10/21      PT LONG TERM GOAL #3   Title Patient will improve MMT to 4+/5 for all tested LE musculature indicative of improved strength as needed for improved performance of transferring and as needed to produce power to prevent LOB during episode of large postural perturbation    Baseline IE: MMT 3+ to 4/5 for hip musculature (flexion, ER, IR, ABD), 4/5 for hamstrings and ankle dorsiflexors. 07/15/21: Not met for ER/IR/ABD. otherwise 4+ or greater. 08/11/21: Not met for hip ER/IR/ABD. 08/27/21: Not met for hip ER/IR/ABD.    10/06/21: Not met for L hip ER/IR/ABD, modest improvement from previous progress note.    Time 8    Period Weeks    Status Partially Met    Target Date 10/29/21       PT LONG TERM GOAL #4   Title Patient will ambulate x 300 feet in clinic with no AD with symmetrical weight shift and normal heel  to toe progression without LOB and no reproduction of pain indicative of improved community-level gait    Baseline IE: Gait with antalgic pattern and decreased stance time LLE. 07/15/21: improving weight shift to LLE, though pt still demonstrates moderate antalgic pattern that is worse following prolonged sitting. 08/11/21: Intermittent improved weight shift, though antalgic pattern is exacerbated after period of sitting/immobility. 08/27/21: pt demonstrates good heel to toe progression and normal stride, but decreased L weight shift/antalgic pattern is present.   10/06/21: Pt is able to ambulate 310 feet in clinic with noted gait deviations, LLE antalgic pattern.    Time 8    Period Weeks    Status Partially Met    Target Date 10/29/21                   Plan - 11/05/21 0731     Clinical Impression Statement Pt is awaiting referral to orthopedist per recommendation given by her referring provider - appointment set for January 2023. Patient is able to attain hip flexion WFL with mild discomfort and has IR symmetrical to contralateral lower limb; she is most limited in hip ER as needed for self-care tasks (e.g. FABER position for donning socks/shoes and for cleaning/hygiene). She exhibits ongoing decreased stance time on LLE that has been less pronounced with PT intervention and s/p injections, but this has been present throughout POC. Given previous trial of Mulligan mobilization, use of traction/long-leg distraction, and specific STM/DTM with ongoing pain with weightbearing, PT is focusing on function and strengthening at this time with use of manual techniques for symptom modulation to improve active participation in PT. Pt has remaining deficits in hip and LE strength, hip ROM, gait deviations, LLE weight shift/weight acceptance, and L iliolumbar/gluteal/groin pain.  Patient will benefit from continued skilled PT intervention to address the above impairments and activity limitations for best return to PLOF.    Personal Factors and Comorbidities Age;Comorbidity 3+;Time since onset of injury/illness/exacerbation    Comorbidities CAD, acid reflux, s/p TIA    Examination-Activity Limitations Bed Mobility;Stairs;Stand;Dressing;Bathing;Bend;Locomotion Level;Squat    Examination-Participation Restrictions Community Activity;Cleaning;Laundry    Stability/Clinical Decision Making Evolving/Moderate complexity    Rehab Potential Good    PT Frequency 2x / week    PT Duration 8 weeks    PT Treatment/Interventions ADLs/Self Care Home Management    PT Next Visit Plan Increasing emphasis on progressing intensity of unloaded isotonics with progressive weightbearing and CKC strengthening as tolerated. MT to modulate symptoms and improve tolerance of active drills in clinic prn. Recommend continued PT 2x/week for 4 weeks    PT Home Exercise Plan Access Code [phone].             Patient will benefit from skilled therapeutic intervention in order to improve the following deficits and impairments:  Abnormal gait, Hypomobility, Difficulty walking, Decreased strength, Pain, Decreased range of motion  Visit Diagnosis: Pain in left hip  Difficulty in walking, not elsewhere classified  Muscle weakness (generalized)  Stiffness of left hip, not elsewhere classified     Problem List Patient Active Problem List   Diagnosis Date Noted   Unstable angina ([place]) 01/19/2017   CAD (coronary artery disease) 01/19/2017   Valentina Gu, PT, DPT #[id number]  Eilleen Kempf, PT 11/05/2021, 7:31 AM  [facility] [address] [place], [place], [address] Phone: [phone]   Fax:  [phone]  Name: Kristen Lloyd MRN: [id number] Date of Birth: 27-Feb-1941

## 2021-11-05 ENCOUNTER — Encounter: Payer: Self-pay | Admitting: Physical Therapy

## 2021-11-05 ENCOUNTER — Ambulatory Visit: Payer: Medicare Other | Admitting: Physical Therapy

## 2021-11-05 ENCOUNTER — Other Ambulatory Visit: Payer: Self-pay

## 2021-11-05 DIAGNOSIS — R262 Difficulty in walking, not elsewhere classified: Secondary | ICD-10-CM

## 2021-11-05 DIAGNOSIS — M6281 Muscle weakness (generalized): Secondary | ICD-10-CM

## 2021-11-05 DIAGNOSIS — M25552 Pain in left hip: Secondary | ICD-10-CM

## 2021-11-05 DIAGNOSIS — M25652 Stiffness of left hip, not elsewhere classified: Secondary | ICD-10-CM

## 2021-11-05 NOTE — Therapy (Signed)
[facility] [address] [place], [place], [address] Phone: [phone]   Fax:  [phone]  Physical Therapy Treatment  Patient Details  Name: Kristen Lloyd MRN: [id number] Date of Birth: 09/28/1941 Referring Provider (PT): Allene Dillon, FNP   Encounter Date: 11/05/2021   PT End of Session - 11/05/21 1521     Visit Number 34    Number of Visits 35    Date for PT Re-Evaluation 08/11/21    Authorization Time Period recert 25/95/[id number]/56/43    Progress Note Due on Visit 74    PT Start Time 1516    PT Stop Time 1600    PT Time Calculation (min) 44 min    Activity Tolerance Patient tolerated treatment well;Patient limited by pain    Behavior During Therapy [facility] for tasks assessed/performed             Past Medical History:  Diagnosis Date   Acid reflux    Coronary artery disease    Diverticulitis    Stroke [facility])     Past Surgical History:  Procedure Laterality Date   ABDOMINAL HYSTERECTOMY     BREAST BIOPSY Left 1990's   lt bx x 2-neg   CARDIAC CATHETERIZATION N/A 01/19/2017   Procedure: Left Heart Cath and Coronary Angiography;  Surgeon: Teodoro Spray, MD;  Location: [place] [facility];  Service: Cardiovascular;  Laterality: N/A;   CARDIAC CATHETERIZATION N/A 01/19/2017   Procedure: Coronary Stent Intervention;  Surgeon: Isaias Cowman, MD;  Location: [place] [facility];  Service: Cardiovascular;  Laterality: N/A;   CARDIAC SURGERY     Ileostomy and reversal N/A 2021   TONSILLECTOMY      There were no vitals filed for this visit.   Subjective Assessment - 11/05/21 1522     Subjective Patient reports feeling sore after her last visit - she attributes soreness to sidelying hip abduction work. She felt that soreness was mild. Patient reports some discomfort along L lateral hip and mildly along L anterior thigh. Patient reports doing well with her home exercises. She reports feeling fatigued and not as  well the last 2 days - she reports some sinus congestion.    Pertinent History Patient is an 80 year old female with primary complaint of L hip pain. Patient has referring diagnosis of L hip OA. Patient reports about 2 years of hip pain. She reports she was doing senior fitness program at [facility] at the time her symptoms began. She states that the staff noticed a change in her gait pattern and she felt minmal pain. This has since worsened. Patient states she was very sick last year and didn't do as much exercise at the time. Patient had ileostomy and reversal in 2021 with significant GI symptoms - significant recovery time required and patient had notably decreased physical activity. Patient did have steroid injection 05/19/2021 that did provide moderate relief. She feels that later in the day she is getting pain into thoracolumbar region from the effect her condition has on her walking. She states that during the day it "isn't that bad." Patient reports no night pain. Pain is worse in AM. Hx of TIA in 2015. No numbness or tingling. Patient stated goals: to avoid surgery and to walk better.    Limitations Standing;Walking;House hold activities    How long can you stand comfortably? 10 minutes    How long can you walk comfortably? 30-60 min in store without significant increase in pain  Diagnostic tests L hip radiographs; evidence of OA/degenerative change    Patient Stated Goals To avoid surgery, able to walk better                   Treatment Performed     NuStep x 5 minutes, Level 4; seat 6, arms 7 - unbilled time    TREATMENT     Manual Therapy - for joint and soft tissue mobility, symptom modulation, hip ROM   L hip PROM within pt tolerance   STM/IASTM with Theraband roller along rectus femoris, vastus lateralis, and TFL/hip flexor region   Passive L hip flexor stretching in modified Thomas stretch position with LLE off edge of bed; 2x30sec   STM and IASTM along L  gluteus minimus and medius   *not today*  L hip long-axis distraction in supine for axial decompression of LLE weightbearing joints Bilateral lower extremity long axis distraction in supine, intermittent [aggravating after 4 min, D/C] Attempted inferior mobilization in 90 deg hip flexion (no belt) - stopped due to intolerance of direct pressure on proximal thigh STM along adductor magnus L hip gentle adductor stretch; 3x30sec L hip posterior mobilization (axial load through femur) with conjunct passive ER/IR within pt tolerance        Therapeutic exercise - for improved strength as needed for ability to perform transferring, weightbearing activity, obstacle negotiation; for soft tissue mobility and ROM     Sidelying hip abduction; 2x12   Glute bridge; 2x10, with Blue Tband above knees   3-way Hip, standing with bilat UE support in // bars; x10 ea dir, bilat, with 4-lb cuff weight      *not today* Lower trunk rotations; x10 alternating Minisquat, with table behind patient; 2x10, with 6-lb Med ball goblet hold, *slow eccentric* Sidelying clamshell; 2x10, [employer], blue swiss ball; 3-way (forward,  L and R diagonal); x5 ea dir Supine hip abduction AROM; x20 Seated knee extension; 2x10, bilateral; 7.5-lb cuff weight         Neuromuscular Re-education - for movement control and trunk stabilization, targeting cueing for gluteal activation   Sidestep with Red Tband at distal shins; 4x down/back, length of // bars   Dynamic march in // bars with mirror feedback; x3 D/B, with 4-lb weights  Single limb stance; 2x20sec     *not today* 6-inch step lateral step up/down, alternating from L to R side; 2x10 alternating  Toe tap on 6-inch step; 2x10 alternating with intermittent touch on // bars; 1x10 with no UE support, standing on Airex pad Forward step up to hurdle step, 6-inch step on staircase; R handrail UE support; 1x8 Hurdle stepping, 3 6-inch hurdles; 4x  down/back length of // bars Tandem stance; 1x30 seconds, bilaterally  Tandem stance on foam; 1x30seconds, bilaterally Standing march; x10 alternating with unilateral table support          ASSESSMENT Pt has low level of hip pain today and exhibits improved stance time on affected lower limb during gait. She is able to progress volume of gluteal strengthening today and tolerates additional loading today well. Patient has intermittent sensitivity along L hip flexors and proximal quadriceps mm that does improve in short-term with manual therapy, but she has ongoing reproduction of pain with weightbearing activity. She has likely reached plateau with hip ER ROM.  Pt has remaining deficits in hip and LE strength, hip ROM, gait deviations, LLE weight shift/weight acceptance, and L iliolumbar/gluteal/groin pain. Patient will benefit from continued skilled PT intervention  to address the above impairments and activity limitations for best return to PLOF.        PT Short Term Goals - 08/11/21 1647       PT SHORT TERM GOAL #1   Title Pt will be independent and 100% compliant with established HEP and activity modification as needed to augment PT intervention and improve pertinent strength and mobility deficits as needed for best return to prior level of function.    Baseline HEP provided at IE 06/15/21. 07/16/21: compliant and independent with exercise technique    Time 3    Period Weeks    Status Achieved    Target Date 07/06/21      PT SHORT TERM GOAL #2   Title Patient will perform independent sit to stand without UE support and no increase in pain indicative of improved functional LE strength and ability to perform transferring necessary for accessing home and community environment    Baseline IE: Significant difficulty with sit to stand with heavy UE support required. 07/16/21: performed with moderate L hip pain c weightbearing. 08/11/21: performed without increase in pain    Time 4    Period Weeks     Status Achieved    Target Date 07/30/21               PT Long Term Goals - 10/06/21 1611       PT LONG TERM GOAL #1   Title Patient will demonstrate improved function as evidenced by a score of 59 on FOTO measure for full participation in activities at home and in the community.    Baseline 06/15/21: 50. 07/15/21: 49. 08/12/21: 49. 08/27/21: 47.   10/06/21: 52.    Time 8    Period Weeks    Status Not Met    Target Date 10/29/21      PT LONG TERM GOAL #2   Title Patient will perform consecutive hurdle step over 3 6-inch hurdles with good heel to toe progression, no LOB, no buckling off affected LE, and no increase in pain indicative of improved ability to weight shift to affected LE and clear lower limb over obstacles as needed for functional mobility    Baseline IE: Antalgic gait pattern wth decreased LLE weight shift. 07/15/21: able to perform dynamic march in // bars with sound weight shift. 07/28/21: performed in parallel bars with no LOB and no net increase in pain/sypmtoms    Time 8    Period Weeks    Status Achieved    Target Date 08/10/21      PT LONG TERM GOAL #3   Title Patient will improve MMT to 4+/5 for all tested LE musculature indicative of improved strength as needed for improved performance of transferring and as needed to produce power to prevent LOB during episode of large postural perturbation    Baseline IE: MMT 3+ to 4/5 for hip musculature (flexion, ER, IR, ABD), 4/5 for hamstrings and ankle dorsiflexors. 07/15/21: Not met for ER/IR/ABD. otherwise 4+ or greater. 08/11/21: Not met for hip ER/IR/ABD. 08/27/21: Not met for hip ER/IR/ABD.    10/06/21: Not met for L hip ER/IR/ABD, modest improvement from previous progress note.    Time 8    Period Weeks    Status Partially Met    Target Date 10/29/21      PT LONG TERM GOAL #4   Title Patient will ambulate x 300 feet in clinic with no AD with symmetrical weight shift and normal heel to toe progression  without LOB and no  reproduction of pain indicative of improved community-level gait    Baseline IE: Gait with antalgic pattern and decreased stance time LLE. 07/15/21: improving weight shift to LLE, though pt still demonstrates moderate antalgic pattern that is worse following prolonged sitting. 08/11/21: Intermittent improved weight shift, though antalgic pattern is exacerbated after period of sitting/immobility. 08/27/21: pt demonstrates good heel to toe progression and normal stride, but decreased L weight shift/antalgic pattern is present.   10/06/21: Pt is able to ambulate 310 feet in clinic with noted gait deviations, LLE antalgic pattern.    Time 8    Period Weeks    Status Partially Met    Target Date 10/29/21                   Plan - 11/05/21 1730     Clinical Impression Statement Pt has low level of hip pain today and exhibits improved stance time on affected lower limb during gait. She is able to progress volume of gluteal strengthening today and tolerates additional loading today well. Patient has intermittent sensitivity along L hip flexors and proximal quadriceps mm that does improve in short-term with manual therapy, but she has ongoing reproduction of pain with weightbearing activity. She has likely reached plateau with hip ER ROM.  Pt has remaining deficits in hip and LE strength, hip ROM, gait deviations, LLE weight shift/weight acceptance, and L iliolumbar/gluteal/groin pain. Patient will benefit from continued skilled PT intervention to address the above impairments and activity limitations for best return to PLOF.    Personal Factors and Comorbidities Age;Comorbidity 3+;Time since onset of injury/illness/exacerbation    Comorbidities CAD, acid reflux, s/p TIA    Examination-Activity Limitations Bed Mobility;Stairs;Stand;Dressing;Bathing;Bend;Locomotion Level;Squat    Examination-Participation Restrictions Community Activity;Cleaning;Laundry    Stability/Clinical Decision Making  Evolving/Moderate complexity    Rehab Potential Good    PT Frequency 2x / week    PT Duration 8 weeks    PT Treatment/Interventions ADLs/Self Care Home Management    PT Next Visit Plan Increasing emphasis on progressing intensity of unloaded isotonics with progressive weightbearing and CKC strengthening as tolerated. MT to modulate symptoms and improve tolerance of active drills in clinic prn. Recommend continued PT 2x/week for 4 weeks    PT Home Exercise Plan Access Code [phone].             Patient will benefit from skilled therapeutic intervention in order to improve the following deficits and impairments:  Abnormal gait, Hypomobility, Difficulty walking, Decreased strength, Pain, Decreased range of motion  Visit Diagnosis: Pain in left hip  Difficulty in walking, not elsewhere classified  Muscle weakness (generalized)  Stiffness of left hip, not elsewhere classified     Problem List Patient Active Problem List   Diagnosis Date Noted   Unstable angina ([place]) 01/19/2017   CAD (coronary artery disease) 01/19/2017   Valentina Gu, PT, DPT #[id number]  Eilleen Kempf, PT 11/05/2021, 5:31 PM  [place] [facility] [address] [place], [place], [address] Phone: [phone]   Fax:  [phone]  Name: Kristen Lloyd MRN: [id number] Date of Birth: 03-22-1941

## 2021-11-10 ENCOUNTER — Encounter: Payer: Self-pay | Admitting: Physical Therapy

## 2021-11-10 ENCOUNTER — Ambulatory Visit: Payer: Medicare Other | Admitting: Physical Therapy

## 2021-11-10 ENCOUNTER — Other Ambulatory Visit: Payer: Self-pay

## 2021-11-10 DIAGNOSIS — M25552 Pain in left hip: Secondary | ICD-10-CM | POA: Diagnosis not present

## 2021-11-10 DIAGNOSIS — M25652 Stiffness of left hip, not elsewhere classified: Secondary | ICD-10-CM

## 2021-11-10 DIAGNOSIS — R262 Difficulty in walking, not elsewhere classified: Secondary | ICD-10-CM

## 2021-11-10 DIAGNOSIS — M6281 Muscle weakness (generalized): Secondary | ICD-10-CM

## 2021-11-10 NOTE — Therapy (Signed)
[facility] [address] [place], [place], [address] Phone: [phone]   Fax:  [phone]  Physical Therapy Progress Note/Discharge Summary  Patient Details  Name: Kristen Lloyd MRN: [id number] Date of Birth: [birth year] Referring Provider (PT): Allene Dillon, FNP   Encounter Date: 11/10/2021   PT End of Session - 11/10/21 1519     Visit Number 35    Number of Visits 35    Date for PT Re-Evaluation 08/11/21    Authorization Time Period recert 34/19/37-90/24/09    Progress Note Due on Visit 92    PT Start Time 1516    PT Stop Time 1601    PT Time Calculation (min) 45 min    Activity Tolerance Patient tolerated treatment well;Patient limited by pain    Behavior During Therapy [facility] for tasks assessed/performed             Past Medical History:  Diagnosis Date   Acid reflux    Coronary artery disease    Diverticulitis    Stroke [facility]     Past Surgical History:  Procedure Laterality Date   ABDOMINAL HYSTERECTOMY     BREAST BIOPSY Left 1990's   lt bx x 2-neg   CARDIAC CATHETERIZATION N/A 01/19/2017   Procedure: Left Heart Cath and Coronary Angiography;  Surgeon: Teodoro Spray, MD;  Location: [place] [facility];  Service: Cardiovascular;  Laterality: N/A;   CARDIAC CATHETERIZATION N/A 01/19/2017   Procedure: Coronary Stent Intervention;  Surgeon: Isaias Cowman, MD;  Location: [place] [facility];  Service: Cardiovascular;  Laterality: N/A;   CARDIAC SURGERY     Ileostomy and reversal N/A 2021   TONSILLECTOMY      There were no vitals filed for this visit.   Subjective Assessment - 11/10/21 1523     Subjective Patient reports she has improved since beginning of PT. Patient reports 50% SANE score at this time. Patient reports that she has limitation with walking, but she feels that is isn't bothering her "as bad." Patient reports pain following household chores e.g. dusting.    Pertinent History Patient is  an 80 year old female with primary complaint of L hip pain. Patient has referring diagnosis of L hip OA. Patient reports about 2 years of hip pain. She reports she was doing senior fitness program at [facility] at the time her symptoms began. She states that the staff noticed a change in her gait pattern and she felt minmal pain. This has since worsened. Patient states she was very sick last year and didn't do as much exercise at the time. Patient had ileostomy and reversal in 2021 with significant GI symptoms - significant recovery time required and patient had notably decreased physical activity. Patient did have steroid injection 05/19/2021 that did provide moderate relief. She feels that later in the day she is getting pain into thoracolumbar region from the effect her condition has on her walking. She states that during the day it "isn't that bad." Patient reports no night pain. Pain is worse in AM. Hx of TIA in 2015. No numbness or tingling. Patient stated goals: to avoid surgery and to walk better.    Limitations Standing;Walking;House hold activities    How long can you stand comfortably? 10-15 minutes    How long can you walk comfortably? 30-60 min in store without significant increase in pain    Diagnostic tests L hip radiographs; evidence of OA/degenerative change    Patient Stated Goals To avoid surgery,  able to walk better                 OBJECTIVE FINDINGS     Posture Decreased lumbar lordosis, mild rounded shoulders in static sitting/standing   Gait Moderate antalgic pattern with decreased stance time LLE, decreased LLE weight shift during stance phase, good stride and gait velocity, forward trunk lean during stance phase LLE [Ambulated 350 feet in hallway]   Palpation Tenderness to palpation along L greater trochanter, L piriformis/gemelli complex, L gluteus medius/minimus   Strength (out of 5) R/L 5/5- Hip flexion 4+/4 Hip ER 4+/4+ Hip IR Not tested/4- Hip  abduction 5/5 Hip adduction 5/5 Knee extension 4+/4+ Knee flexion 4+/5 Ankle dorsiflexion *Indicates pain     AROM (degrees) R/L (all movements include overpressure unless otherwise stated) Lumbar forward flexion (65): WNL  Lumbar extension (30): <25 Lumbar lateral flexion (25): R: 75% L: 50%* Thoracic and Lumbar rotation (30 degrees):  R: 25% L: 25% Hip ER (0-45): R: 33, L: 17 Hip Flexion (0-125): R: WFL, L WFL Hip Abduction (0-40): R: 40 L: 15 *Indicates pain     PROM (degrees) PROM = AROM R/L Hip IR (0-45): R: 15 L: 14 Hip ER (0-45): R: 40, L: 38 Hip Flexion (0-125): R: 110, L 110 Hip Abduction (0-40): R: 40 L: 23 Hip extension (0-15): R: not tested L: to neutral * *Indicates pain            Treatment Performed     NuStep x 5 minutes, Level 4; seat 6, arms 7 - unbilled time     TREATMENT    Therapeutic Activities  Re-assessment performed (see above)  Patient education (see PT education below)         ASSESSMENT Patient is able to ambulate for community-level distance without increase in hip pain while in clinic today, albeit she has ongoing gait deficits that are intermittently more-pronounced after period of prolonged sitting. Patient has increased FOTO score relative to IE, but she has not yet met FOTO goal. She has ongoing hip strength deficits, although L hip IR strength has improved relative to last progress note. No change in hip ROM. Patient exhibits thoracolumbar AROM deficits this afternoon that were largely absent during last re-assessment. She is still limited in volume of weightbearing/ambulatory activity secondary to L hip pain. Pt has made notable progress with PT, but unfortunately pt has reached plateau in functional progress over the previous month. Pt is awaiting follow-up with orthopedist regarding ongoing L hip pain. Discussed at length with patient current progress made and relative plateau in gains made with PT, continued HEP to maintain  strength and functional progress made, and recommendation for keeping f/u with orthopedist. Patient is appropriate for continued maintenance HEP at this time and discharge given plateau with PT.         PT Education - 11/11/21 1301     Education Details Reviewed home exercise program and updated HEP to maintain progress attained to date. Discussed at length with patient relative plateau with progress and subjective/objective gains and continued deficits with gait and volume of weightbearing activity. Recommended continued f/u with orthopedist and to follow-up with referring provider to consider re-initiation of physical therapy if her condition worsens.    Person(s) Educated Patient    Methods Explanation;Demonstration;Handout    Comprehension Verbalized understanding;Returned demonstration              PT Short Term Goals - 08/11/21 1647       PT SHORT TERM  GOAL #1   Title Pt will be independent and 100% compliant with established HEP and activity modification as needed to augment PT intervention and improve pertinent strength and mobility deficits as needed for best return to prior level of function.    Baseline HEP provided at IE 06/15/21. 07/16/21: compliant and independent with exercise technique    Time 3    Period Weeks    Status Achieved    Target Date 07/06/21      PT SHORT TERM GOAL #2   Title Patient will perform independent sit to stand without UE support and no increase in pain indicative of improved functional LE strength and ability to perform transferring necessary for accessing home and community environment    Baseline IE: Significant difficulty with sit to stand with heavy UE support required. 07/16/21: performed with moderate L hip pain c weightbearing. 08/11/21: performed without increase in pain    Time 4    Period Weeks    Status Achieved    Target Date 07/30/21               PT Long Term Goals - 11/10/21 1551       PT LONG TERM GOAL #1   Title  Patient will demonstrate improved function as evidenced by a score of 59 on FOTO measure for full participation in activities at home and in the community.    Baseline 06/15/21: 50. 07/15/21: 49. 08/12/21: 49. 08/27/21: 47.   10/06/21: 52.  11/10/21: 55.    Time 8    Period Weeks    Status Not Met    Target Date 10/29/21      PT LONG TERM GOAL #2   Title Patient will perform consecutive hurdle step over 3 6-inch hurdles with good heel to toe progression, no LOB, no buckling off affected LE, and no increase in pain indicative of improved ability to weight shift to affected LE and clear lower limb over obstacles as needed for functional mobility    Baseline IE: Antalgic gait pattern wth decreased LLE weight shift. 07/15/21: able to perform dynamic march in // bars with sound weight shift. 07/28/21: performed in parallel bars with no LOB and no net increase in pain/sypmtoms    Time 8    Period Weeks    Status Achieved    Target Date 08/10/21      PT LONG TERM GOAL #3   Title Patient will improve MMT to 4+/5 for all tested LE musculature indicative of improved strength as needed for improved performance of transferring and as needed to produce power to prevent LOB during episode of large postural perturbation    Baseline IE: MMT 3+ to 4/5 for hip musculature (flexion, ER, IR, ABD), 4/5 for hamstrings and ankle dorsiflexors. 07/15/21: Not met for ER/IR/ABD. otherwise 4+ or greater. 08/11/21: Not met for hip ER/IR/ABD. 08/27/21: Not met for hip ER/IR/ABD.    10/06/21: Not met for L hip ER/IR/ABD, modest improvement from previous progress note.  11/11/21: Not met for L hip ER and ABD, met for L hip ER.    Time 8    Period Weeks    Status Partially Met    Target Date 10/29/21      PT LONG TERM GOAL #4   Title Patient will ambulate x 300 feet in clinic with no AD with symmetrical weight shift and normal heel to toe progression without LOB and no reproduction of pain indicative of improved community-level gait     Baseline IE:  Gait with antalgic pattern and decreased stance time LLE. 07/15/21: improving weight shift to LLE, though pt still demonstrates moderate antalgic pattern that is worse following prolonged sitting. 08/11/21: Intermittent improved weight shift, though antalgic pattern is exacerbated after period of sitting/immobility. 08/27/21: pt demonstrates good heel to toe progression and normal stride, but decreased L weight shift/antalgic pattern is present.   10/06/21: Pt is able to ambulate 310 feet in clinic with noted gait deviations, LLE antalgic pattern.  11/10/21: ambulated 350 feet in hallway iwith mild antalgic pattern, no increase in pain    Time 8    Period Weeks    Status Partially Met    Target Date 10/29/21                   Plan - 11/11/21 1313     Clinical Impression Statement Patient is able to ambulate for community-level distance without increase in hip pain while in clinic today, albeit she has ongoing gait deficits that are intermittently more-pronounced after period of prolonged sitting. Patient has increased FOTO score relative to IE, but she has not yet met FOTO goal. She has ongoing hip strength deficits, although L hip IR strength has improved relative to last progress note. No change in hip ROM. Patient exhibits thoracolumbar AROM deficits this afternoon that were largely absent during last re-assessment. She is still limited in volume of weightbearing/ambulatory activity secondary to L hip pain. Pt has made notable progress with PT, but unfortunately pt has reached plateau in functional progress over the previous month. Pt is awaiting follow-up with orthopedist regarding ongoing L hip pain. Discussed at length with patient current progress made and relative plateau in gains made with PT, continued HEP to maintain strength and functional progress made, and recommendation for keeping f/u with orthopedist. Patient is appropriate for continued maintenance HEP at this time and  discharge given plateau with PT.    Personal Factors and Comorbidities Age;Comorbidity 3+;Time since onset of injury/illness/exacerbation    Comorbidities CAD, acid reflux, s/p TIA    Examination-Activity Limitations Bed Mobility;Stairs;Stand;Dressing;Bathing;Bend;Locomotion Level;Squat    Examination-Participation Restrictions Community Activity;Cleaning;Laundry    Stability/Clinical Decision Making Evolving/Moderate complexity    Rehab Potential Good    PT Frequency 2x / week    PT Duration 8 weeks    PT Treatment/Interventions ADLs/Self Care Home Management    PT Next Visit Plan Pt to continue with HEP and discharge current PT plan of care.    PT Home Exercise Plan Access Code [phone].             Patient will benefit from skilled therapeutic intervention in order to improve the following deficits and impairments:  Abnormal gait, Hypomobility, Difficulty walking, Decreased strength, Pain, Decreased range of motion  Visit Diagnosis: Pain in left hip  Difficulty in walking, not elsewhere classified  Muscle weakness (generalized)  Stiffness of left hip, not elsewhere classified     Problem List Patient Active Problem List   Diagnosis Date Noted   Unstable angina ([place]) 01/19/2017   CAD (coronary artery disease) 01/19/2017   Valentina Gu, PT, DPT #[id number]  Eilleen Kempf, PT 11/11/2021, 1:13 PM  [facility] [address] [place], [place], [address] Phone: [phone]   Fax:  [phone]  Name: Kristen Lloyd MRN: [id number] Date of Birth: 1941-09-06

## 2021-11-12 ENCOUNTER — Ambulatory Visit: Payer: Medicare Other | Admitting: Physical Therapy

## 2022-02-22 ENCOUNTER — Other Ambulatory Visit: Payer: Self-pay | Admitting: Orthopedic Surgery

## 2022-02-22 DIAGNOSIS — M7582 Other shoulder lesions, left shoulder: Secondary | ICD-10-CM

## 2022-03-10 ENCOUNTER — Ambulatory Visit
Admission: RE | Admit: 2022-03-10 | Discharge: 2022-03-10 | Disposition: A | Discharge status: Home / Self Care | Payer: Medicare Other | Source: Ambulatory Visit | Attending: Orthopedic Surgery | Admitting: Orthopedic Surgery

## 2022-03-10 ENCOUNTER — Other Ambulatory Visit: Payer: Self-pay

## 2022-03-10 DIAGNOSIS — M7582 Other shoulder lesions, left shoulder: Secondary | ICD-10-CM | POA: Insufficient documentation

## 2022-03-26 DIAGNOSIS — M7582 Other shoulder lesions, left shoulder: Secondary | ICD-10-CM | POA: Insufficient documentation

## 2022-03-26 DIAGNOSIS — M75112 Incomplete rotator cuff tear or rupture of left shoulder, not specified as traumatic: Secondary | ICD-10-CM | POA: Insufficient documentation

## 2022-03-26 DIAGNOSIS — M19012 Primary osteoarthritis, left shoulder: Secondary | ICD-10-CM | POA: Insufficient documentation

## 2022-04-21 DIAGNOSIS — M7581 Other shoulder lesions, right shoulder: Secondary | ICD-10-CM | POA: Insufficient documentation

## 2022-04-21 DIAGNOSIS — M19011 Primary osteoarthritis, right shoulder: Secondary | ICD-10-CM | POA: Insufficient documentation

## 2022-05-11 ENCOUNTER — Ambulatory Visit: Payer: Medicare Other

## 2022-08-05 ENCOUNTER — Ambulatory Visit (LOCAL_COMMUNITY_HEALTH_CENTER): Payer: Medicare Other

## 2022-08-05 DIAGNOSIS — Z23 Encounter for immunization: Secondary | ICD-10-CM | POA: Diagnosis not present

## 2022-08-05 DIAGNOSIS — Z719 Counseling, unspecified: Secondary | ICD-10-CM

## 2022-08-05 NOTE — Progress Notes (Addendum)
  Are you feeling sick today? No   Have you ever received a dose of COVID-19 Vaccine? [employer], [place], [place], [place], Other) Yes  If yes, which vaccine and how many doses?   5 doses Pfizer   Did you bring the vaccination record card or other documentation?  Yes   Do you have a health condition or are undergoing treatment that makes you moderately or severely immunocompromised? This would include, but not be limited to: cancer, HIV, organ transplant, immunosuppressive therapy/high-dose corticosteroids, or moderate/severe primary immunodeficiency.  No  Have you received COVID-19 vaccine before or during hematopoietic cell transplant (HCT) or CAR-T-cell therapies? No  Have you ever had an allergic reaction to: (This would include a severe allergic reaction or a reaction that caused hives, swelling, or respiratory distress, including wheezing.) A component of a COVID-19 vaccine or a previous dose of COVID-19 vaccine? No   Have you ever had an allergic reaction to another vaccine (other thanCOVID-19 vaccine) or an injectable medication? (This would include a severe allergic reaction or a reaction that caused hives, swelling, or respiratory distress, including wheezing.)   No    Do you have a history of any of the following:  Myocarditis or Pericarditis No  Dermal fillers:  No  Multisystem Inflammatory Syndrome (MIS-C or MIS-A)? No  COVID-19 disease within the past 3 months? No  Vaccinated with monkeypox vaccine in the last 4 weeks? No    Patient seen in nurse clinic with husband for COVID booster.  Patient also decided to get Tdap since last was in 2012. Pfizer BV +12Y administered intramuscularly in left deltoid and Tdap administered intramuscularly in right deltoid. Tolerated well.  VIS for Tdap provided.  Declined VIS for [employer].  COVID card updated. [facility] updated and copy provided to patient.

## 2022-10-27 ENCOUNTER — Other Ambulatory Visit: Payer: Self-pay | Admitting: Infectious Diseases

## 2022-10-27 DIAGNOSIS — Z1231 Encounter for screening mammogram for malignant neoplasm of breast: Secondary | ICD-10-CM

## 2022-12-07 ENCOUNTER — Ambulatory Visit (LOCAL_COMMUNITY_HEALTH_CENTER): Payer: Medicare Other

## 2022-12-07 DIAGNOSIS — Z23 Encounter for immunization: Secondary | ICD-10-CM

## 2022-12-07 DIAGNOSIS — Z719 Counseling, unspecified: Secondary | ICD-10-CM

## 2022-12-07 NOTE — Progress Notes (Signed)
  Are you feeling sick today? No   Have you ever received a dose of COVID-19 Vaccine? [employer], [place], [place], [place], Other) Yes  If yes, which vaccine and how many doses?  PFIZER, 6    Did you bring the vaccination record card or other documentation?  Yes   Do you have a health condition or are undergoing treatment that makes you moderately or severely immunocompromised? This would include, but not be limited to: cancer, HIV, organ transplant, immunosuppressive therapy/high-dose corticosteroids, or moderate/severe primary immunodeficiency.  No  Have you received COVID-19 vaccine before or during hematopoietic cell transplant (HCT) or CAR-T-cell therapies? No  Have you ever had an allergic reaction to: (This would include a severe allergic reaction or a reaction that caused hives, swelling, or respiratory distress, including wheezing.) A component of a COVID-19 vaccine or a previous dose of COVID-19 vaccine? No   Have you ever had an allergic reaction to another vaccine (other thanCOVID-19 vaccine) or an injectable medication? (This would include a severe allergic reaction or a reaction that caused hives, swelling, or respiratory distress, including wheezing.)   No    Do you have a history of any of the following:  Myocarditis or Pericarditis No  Dermal fillers:  No  Multisystem Inflammatory Syndrome (MIS-C or MIS-A)? No  COVID-19 disease within the past 3 months? No  Vaccinated with monkeypox vaccine in the last 4 weeks? No  Eligible, administered Pfizer comirnaty [phone]. Monitored, tolerated well. M.Sweden Lesure, LPN.

## 2022-12-09 ENCOUNTER — Ambulatory Visit
Admission: RE | Admit: 2022-12-09 | Discharge: 2022-12-09 | Disposition: A | Discharge status: Home / Self Care | Payer: Medicare Other | Source: Ambulatory Visit | Attending: Infectious Diseases | Admitting: Infectious Diseases

## 2022-12-09 DIAGNOSIS — Z1231 Encounter for screening mammogram for malignant neoplasm of breast: Secondary | ICD-10-CM | POA: Insufficient documentation

## 2023-05-01 NOTE — Discharge Instructions (Addendum)
Instructions after Total Hip Replacement     James P. Hooten, Jr., M.D.     Dept. of Orthopaedics & Sports Medicine  [facility]  [address]  [place], [place]  [address]  Phone: [phone]   Fax: [phone]    DIET: Drink plenty of non-alcoholic fluids. Resume your normal diet. Include foods high in fiber.  ACTIVITY:  You may use crutches or a walker with weight-bearing as tolerated, unless instructed otherwise. You may be weaned off of the walker or crutches by your Physical Therapist.  Do NOT reach below the level of your knees or cross your legs until allowed.    Continue doing gentle exercises. Exercising will reduce the pain and swelling, increase motion, and prevent muscle weakness.   Please continue to use the TED compression stockings for 6 weeks. You may remove the stockings at night, but should reapply them in the morning. Do not drive or operate any equipment until instructed.  WOUND CARE:  Continue to use ice packs periodically to reduce pain and swelling. Keep the incision clean and dry. You may bathe or shower after the staples are removed at the first office visit following surgery.  MEDICATIONS: You may resume your regular medications. Please take the pain medication as prescribed on the medication. Do not take pain medication on an empty stomach. You have been given a prescription for a blood thinner to prevent blood clots. Please take the medication as instructed. (NOTE: After completing a 2 week course of Lovenox, take one Enteric-coated aspirin twice a day.) Pain medications and iron supplements can cause constipation. Use a stool softener (Senokot or Colace) on a daily basis and a laxative (dulcolax or miralax) as needed. Do not drive or drink alcoholic beverages when taking pain medications.  CALL THE OFFICE FOR: Temperature above 101 degrees Excessive bleeding or drainage on the dressing. Excessive swelling, coldness, or paleness of the  toes. Persistent nausea and vomiting.  FOLLOW-UP:  You should have an appointment to return to the office in 6 weeks after surgery. Arrangements have been made for continuation of Physical Therapy (either home therapy or outpatient therapy).     [facility] Department Directory         [email or web address]       [email or web address]          Cardiology  Appointments: [place] - [phone] [place] - [phone]  Endocrinology  Appointments: [place] - [phone] [place] - [phone]  Gastroenterology  Appointments: [place] - [phone] [place] - [phone]        General Surgery   Appointments: [place] - [phone]  Internal Medicine/Family Medicine  Appointments: [place] - [phone] [place] - [phone] [place] - [phone]  Metabolic and Weigh Loss Surgery  Appointments: [place] - [phone]        Neurology  Appointments: [place] - [phone] [place] - [phone]  Neurosurgery  Appointments: [place] - [phone]  Obstetrics & Gynecology  Appointments: [place] - [phone] [place] - [phone]        Pediatrics  Appointments: [place] - [phone] [place] - [phone]  Physiatry  Appointments: [place] -[phone]  Physical Therapy  Appointments: [place] - [phone] [place] - [phone]        Podiatry  Appointments: [place] - [phone] [place] - [phone]  Pulmonology  Appointments: [place] - [phone]  Rheumatology  Appointments: [place] - [phone]        [place] Location: [facility]  [address] , [place]  [address]  [place] Location: [facility] [address] [place], [place]  [address]    [place] Location: [facility] [address] [place], [place]  [address]    

## 2023-05-05 ENCOUNTER — Inpatient Hospital Stay: Admission: RE | Admit: 2023-05-05 | Payer: Medicare Other | Source: Ambulatory Visit

## 2023-05-06 ENCOUNTER — Other Ambulatory Visit: Payer: Self-pay

## 2023-05-06 ENCOUNTER — Encounter
Admission: RE | Admit: 2023-05-06 | Discharge: 2023-05-06 | Disposition: A | Discharge status: Home / Self Care | Payer: Medicare Other | Source: Ambulatory Visit | Attending: Orthopedic Surgery | Admitting: Orthopedic Surgery

## 2023-05-06 VITALS — BP 147/78 | HR 78 | Temp 98.2°F | Resp 18 | Ht 61.0 in | Wt 136.3 lb

## 2023-05-06 DIAGNOSIS — I2510 Atherosclerotic heart disease of native coronary artery without angina pectoris: Secondary | ICD-10-CM | POA: Diagnosis not present

## 2023-05-06 DIAGNOSIS — Z01818 Encounter for other preprocedural examination: Secondary | ICD-10-CM | POA: Insufficient documentation

## 2023-05-06 DIAGNOSIS — Z880 Allergy status to penicillin: Secondary | ICD-10-CM | POA: Diagnosis not present

## 2023-05-06 DIAGNOSIS — M1612 Unilateral primary osteoarthritis, left hip: Secondary | ICD-10-CM | POA: Diagnosis not present

## 2023-05-06 DIAGNOSIS — I25810 Atherosclerosis of coronary artery bypass graft(s) without angina pectoris: Secondary | ICD-10-CM

## 2023-05-06 DIAGNOSIS — Z01812 Encounter for preprocedural laboratory examination: Secondary | ICD-10-CM

## 2023-05-06 DIAGNOSIS — I200 Unstable angina: Secondary | ICD-10-CM

## 2023-05-06 DIAGNOSIS — Z01810 Encounter for preprocedural cardiovascular examination: Secondary | ICD-10-CM

## 2023-05-06 HISTORY — DX: Hyperlipidemia, unspecified: E78.5

## 2023-05-06 HISTORY — DX: Unilateral primary osteoarthritis, left hip: M16.12

## 2023-05-06 LAB — C-REACTIVE PROTEIN: CRP: <0.5 mg/dL (ref <1.0)

## 2023-05-06 LAB — COMPREHENSIVE METABOLIC PANEL
ALT: 11 U/L (ref 0–44)
AST: 16 U/L (ref 15–41)
Albumin: 4.2 g/dL (ref 3.5–5.0)
Alkaline Phosphatase: 61 U/L (ref 38–126)
Anion gap: 10 (ref 5–15)
BUN: 17 mg/dL (ref 8–23)
CO2: 26 mmol/L (ref 22–32)
Calcium: 9 mg/dL (ref 8.9–10.3)
Chloride: 103 mmol/L (ref 98–111)
Creatinine, Ser: 0.84 mg/dL (ref 0.44–1.00)
GFR, Estimated: >60 mL/min (ref >60)
Glucose, Bld: 78 mg/dL (ref 70–99)
Potassium: 3.9 mmol/L (ref 3.5–5.1)
Sodium: 139 mmol/L (ref 135–145)
Total Bilirubin: 0.9 mg/dL (ref 0.3–1.2)
Total Protein: 6.6 g/dL (ref 6.5–8.1)

## 2023-05-06 LAB — URINALYSIS, ROUTINE W REFLEX MICROSCOPIC
Bilirubin Urine: NEGATIVE
Glucose, UA: NEGATIVE mg/dL
Hgb urine dipstick: NEGATIVE
Ketones, ur: NEGATIVE mg/dL
Leukocytes,Ua: NEGATIVE
Nitrite: NEGATIVE
Protein, ur: NEGATIVE mg/dL
Specific Gravity, Urine: 1.004 — ABNORMAL LOW (ref 1.005–1.030)
pH: 7 (ref 5.0–8.0)

## 2023-05-06 LAB — CBC
HCT: 39.3 % (ref 36.0–46.0)
Hemoglobin: 12.8 g/dL (ref 12.0–15.0)
MCH: 32.3 pg (ref 26.0–34.0)
MCHC: 32.6 g/dL (ref 30.0–36.0)
MCV: 99.2 fL (ref 80.0–100.0)
Platelets: 199 10*3/uL (ref 150–400)
RBC: 3.96 MIL/uL (ref 3.87–5.11)
RDW: 14.5 % (ref 11.5–15.5)
WBC: 6.4 10*3/uL (ref 4.0–10.5)
nRBC: 0 % (ref 0.0–0.2)

## 2023-05-06 LAB — TYPE AND SCREEN
ABO/RH(D): A POS
Antibody Screen: NEGATIVE

## 2023-05-06 LAB — SURGICAL PCR SCREEN
MRSA, PCR: NEGATIVE
Staphylococcus aureus: POSITIVE — AB

## 2023-05-06 LAB — SEDIMENTATION RATE: Sed Rate: 11 mm/hr (ref 0–30)

## 2023-05-06 NOTE — Patient Instructions (Addendum)
Your procedure is scheduled on: 05/13/2023  Report to the Registration Desk on the 1st floor of the [facility]. To find out your arrival time, please call [phone] between 1PM - 3PM on: 5/16/ 202 4 If your arrival time is 6:00 am, do not arrive before that time as the [facility] entrance doors do not open until 6:00 am.  REMEMBER: Instructions that are not followed completely may result in serious medical risk, up to and including death; or upon the discretion of your surgeon and anesthesiologist your surgery may need to be rescheduled.  Do not eat food after midnight the night before surgery.  No gum chewing or hard candies.  You may however, drink CLEAR liquids up to 2 hours before you are scheduled to arrive for your surgery. Do not drink anything within 2 hours of your scheduled arrival time.  Clear liquids include: - water  - apple juice without pulp - gatorade (not RED colors) - black coffee or tea (Do NOT add milk or creamers to the coffee or tea) Do NOT drink anything that is not on this list.   In addition, your doctor has ordered for you to drink the provided:  Ensure Pre-Surgery Clear Carbohydrate Drink   Drinking this carbohydrate drink up to two hours before surgery helps to reduce insulin resistance and improve patient outcomes. Please complete drinking 2 hours before scheduled arrival time.  One week prior to surgery: Stop Anti-inflammatories (NSAIDS) such as Advil, Aleve, Ibuprofen, Motrin, Naproxen, Naprosyn and Aspirin based products such as Excedrin, Goody's Powder, BC Powder. Stop ANY OVER THE COUNTER supplements until after surgery like  and Vit D. You may however, continue to take Tylenol if needed for pain up until the day of surgery.  Continue taking all prescribed medications with the exception of the following:      Aspirin- please ask the prescribing doctor , surgeon or cardiologist , for the recommendation and please remember the date of last  taken.  TAKE ONLY THESE MEDICATIONS THE MORNING OF SURGERY WITH A SIP OF WATER:  omeprazole (PRILOSEC) take one the night before and one on the morning of surgery - helps to prevent nausea after surgery.) traMADol (ULTRAM)  3. fenofibrate   No Alcohol for 24 hours before or after surgery.  No Smoking including e-cigarettes for 24 hours before surgery.  No chewable tobacco products for at least 6 hours before surgery.  No nicotine patches on the day of surgery.  Do not use any "recreational" drugs for at least a week (preferably 2 weeks) before your surgery.  Please be advised that the combination of cocaine and anesthesia may have negative outcomes, up to and including death. If you test positive for cocaine, your surgery will be cancelled.  On the morning of surgery brush your teeth with toothpaste and water, you may rinse your mouth with mouthwash if you wish. Do not swallow any toothpaste or mouthwash.  Use CHG Soap as directed on instruction sheet.- Five days of soap  Do not wear jewelry, make-up, hairpins, clips or nail polish.  Do not wear lotions, powders, or perfumes.   Do not shave body hair from the neck down 48 hours before surgery.  Contact lenses, hearing aids and dentures may not be worn into surgery.  Do not bring valuables to the hospital. [facility] is not responsible for any missing/lost belongings or valuables.    Notify your doctor if there is any change in your medical condition (cold, fever, infection).  Wear comfortable clothing (specific to your surgery type) to the hospital.  After surgery, you can help prevent lung complications by doing breathing exercises.  Take deep breaths and cough every 1-2 hours. Your doctor may order a device called an Incentive Spirometer to help you take deep breaths.  If you are being admitted to the hospital overnight, leave your suitcase in the car. After surgery it may be brought to your room.  In case of increased  patient census, it may be necessary for you, the patient, to continue your postoperative care in the Same Day Surgery department.    Please call the Pre-admissions Testing Dept. at [phone] if you have any questions about these instructions.  Surgery Visitation Policy:  Patients having surgery or a procedure may have two visitors.  Children under the age of 26 must have an adult with them who is not the patient.  Inpatient Visitation:    Visiting hours are 7 a.m. to 8 p.m. Up to four visitors are allowed at one time in a patient room. The visitors may rotate out with other people during the day.  One visitor age 67 or older may stay with the patient overnight and must be in the room by 8 p.m.   Preoperative Educational Videos for Total Hip, Knee and Shoulder Replacements  To better prepare for surgery, please view our videos that explain the physical activity and discharge planning required to have the best surgical recovery at [facility].  [email or web address]  Questions? Call [phone] or email [email or web address]   How to Use an Incentive Spirometer  An incentive spirometer is a tool that measures how well you are filling your lungs with each breath. Learning to take long, deep breaths using this tool can help you keep your lungs clear and active. This may help to reverse or lessen your chance of developing breathing (pulmonary) problems, especially infection. You may be asked to use a spirometer: After a surgery. If you have a lung problem or a history of smoking. After a long period of time when you have been unable to move or be active. If the spirometer includes an indicator to show the highest number that you have reached, your health care provider or respiratory therapist will help you set a goal. Keep a log of your progress as told by your health care provider. What are the risks? Breathing too quickly may cause  dizziness or cause you to pass out. Take your time so you do not get dizzy or light-headed. If you are in pain, you may need to take pain medicine before doing incentive spirometry. It is harder to take a deep breath if you are having pain. How to use your incentive spirometer  Sit up on the edge of your bed or on a chair. Hold the incentive spirometer so that it is in an upright position. Before you use the spirometer, breathe out normally. Place the mouthpiece in your mouth. Make sure your lips are closed tightly around it. Breathe in slowly and as deeply as you can through your mouth, causing the piston or the ball to rise toward the top of the chamber. Hold your breath for 3-5 seconds, or for as long as possible. If the spirometer includes a coach indicator, use this to guide you in breathing. Slow down your breathing if the indicator goes above the marked areas. Remove the mouthpiece from your mouth and breathe out normally. The piston or ball will return to the bottom of  the chamber. Rest for a few seconds, then repeat the steps 10 or more times. Take your time and take a few normal breaths between deep breaths so that you do not get dizzy or light-headed. Do this every 1-2 hours when you are awake. If the spirometer includes a goal marker to show the highest number you have reached (best effort), use this as a goal to work toward during each repetition. After each set of 10 deep breaths, cough a few times. This will help to make sure that your lungs are clear. If you have an incision on your chest or abdomen from surgery, place a pillow or a rolled-up towel firmly against the incision when you cough. This can help to reduce pain while taking deep breaths and coughing. General tips When you are able to get out of bed: Walk around often. Continue to take deep breaths and cough in order to clear your lungs. Keep using the incentive spirometer until your health care provider says it is okay  to stop using it. If you have been in the hospital, you may be told to keep using the spirometer at home. Contact a health care provider if: You are having difficulty using the spirometer. You have trouble using the spirometer as often as instructed. Your pain medicine is not giving enough relief for you to use the spirometer as told. You have a fever. Get help right away if: You develop shortness of breath. You develop a cough with bloody mucus from the lungs. You have fluid or blood coming from an incision site after you cough. Summary An incentive spirometer is a tool that can help you learn to take long, deep breaths to keep your lungs clear and active. You may be asked to use a spirometer after a surgery, if you have a lung problem or a history of smoking, or if you have been inactive for a long period of time. Use your incentive spirometer as instructed every 1-2 hours while you are awake. If you have an incision on your chest or abdomen, place a pillow or a rolled-up towel firmly against your incision when you cough. This will help to reduce pain. Get help right away if you have shortness of breath, you cough up bloody mucus, or blood comes from your incision when you cough. This information is not intended to replace advice given to you by your health care provider. Make sure you discuss any questions you have with your health care provider. Document Revised: 03/03/2020 Document Reviewed: 03/03/2020 Elsevier Patient Education  2023 [employer].

## 2023-05-08 NOTE — H&P (Signed)
ORTHOPAEDIC HISTORY & PHYSICAL Kristen Lloyd, Adelina Mings., MD - 04/19/2023 11:00 AM EDT Formatting of this note is different from the original. Images from the original note were not included. Chief Complaint: Chief Complaint Patient presents with Left Hip - Pain  Reason for Visit: The patient is a 82 y.o. female who presents today with her husband for reevaluation of her left hip. She has a long history of right hip and groin pain. The pain is worse with weight bearing and range of motion. The patient has not appreciated any significant improvement despite tylenol, oral steroids, tramadol, intraarticular corticosteroid injections, activity modification, and ambulatory aids. The hip pain limits the patient's ability to ambulate long distances. She is using a cane for ambulation. The patient states that the hip pain has progressed to the point that it is significantly interfering with her activities of daily living. She had postponed consideration of surgical intervention last year due to some shoulder issues.  Medications: Current Outpatient Medications Medication Sig Dispense Refill aspirin 81 MG EC tablet Take 81 mg by mouth once daily. cholecalciferol (VITAMIN D3) 1,000 unit tablet Take 1 Units by mouth once daily. diclofenac sodium (VOLTAREN TOPICAL) Apply topically at bedtime fenofibrate 160 MG tablet Take 1 tablet (160 mg total) by mouth once daily 90 tablet 3 fluticasone propionate (FLONASE) 50 mcg/actuation nasal spray Place 1 spray into both nostrils 2 (two) times daily 48 g 3 ibuprofen (MOTRIN ORAL) Take 200-400 mg by mouth 3 (three) times daily as needed mag/aluminum/sod bicarb/alginc (GAVISCON ORAL) Take 1 tablet by mouth at bedtime omeprazole (PRILOSEC) 40 MG DR capsule Take 1 capsule (40 mg total) by mouth once daily 30 capsule 11 traMADoL (ULTRAM) 50 mg tablet Take 1 tablet (50 mg total) by mouth every 6 (six) hours as needed for Pain 60 tablet 0  No current  facility-administered medications for this visit.  Allergies: Allergies Allergen Reactions Levaquin [Levofloxacin] Other (See Comments) Lower leg pain suspicious for achilles tendinopathy Flagyl [Metronidazole] Other (See Comments) Able to tolerate if needed, but refuses if possible due to dysgeusia Moxifloxacin Other (See Comments) "spaced out" Norvasc [Amlodipine] Other (See Comments) Fatigue at high dose Sulfamethoxazole-Trimethoprim Nausea, Rash and Vomiting  Past Medical History: Past Medical History: Diagnosis Date Allergic state Atrophic vaginitis CAD (coronary artery disease) Cataract cortical, senile Diverticulitis Diverticulosis noted to be extensive 2015 History of shingles Hyperlipidemia Hypertension Osteoarthritis Osteopenia Sinusitis, unspecified Spastic colon Stroke (CMS/HHS-HCC) TIA (transient ischemic attack) 03/27/2014 Transient slurred speech - cerebellar - follows with Dr Sherryll Burger  Past Surgical History: Past Surgical History: Procedure Laterality Date TONSILLECTOMY 1957 HYSTERECTOMY VAGINAL 1963 Cardiac cath 01/05/2010 revealed a 75% proximal LAD stenosis, 40% mid RCA stenosis. placement of a Xience 2.75 x 15 mm drug-eluting stent in the proximal LAD. UPPER GASTROINTESTINAL ENDOSCOPY 03/28/2014 small-medium hiatal hernia, normal stomach and duodenum. COLONOSCOPY 03/28/2014 sigmoid diverticulosis, internal hemorrhoids; repeat in 03/2019. COLONOSCOPY 04/09/2020 [facility] Sigmoid colectomy w/ colorectal anastomosis, and loop ileostomy 05/08/2020 Dr Epifania Gore [facility] ILEOSTOMY REVERSAL 08/11/2020 Dr Ree Kida [facility] BREAST EXCISIONAL BIOPSY 2 in 1990s  Social History: Social History  Socioeconomic History Marital status: Married Spouse name: Jasmaine Croghan Number of children: 2 Years of education: 12 Highest education level: High school graduate Occupational History Occupation: Retired Comment: Librarian, academic Tobacco Use Smoking status:  Never Smokeless tobacco: Never Vaping Use Vaping status: Never Used Substance and Sexual Activity Alcohol use: Yes Alcohol/week: 7.0 standard drinks of alcohol Types: 7 Shots of liquor per week Comment: drink 1 per day Drug use: No Sexual activity: Yes  Partners: Male  Social Determinants of Health  Food Insecurity: No Food Insecurity (12/25/2019) Received from [facility], [facility] Hunger Vital Sign Worried About Running Out of Food in the Last Year: Never true Ran Out of Food in the Last Year: Never true Transportation Needs: No Transportation Needs (12/25/2019) Re[facility] from [facility], [facility] - Transportation Lack of Transportation (Medical): No Lack of Transportation (Non-Medical): No  Family History: Family History Problem Relation Name Age of Onset Asthma Mother Hyperlipidemia (Elevated cholesterol) Mother Thyroid disease Mother Stroke Father Dementia Father Hyperlipidemia (Elevated cholesterol) Sister Bipolar disorder Sister Colon polyps Brother  Review of Systems: A comprehensive 14 point ROS was performed, reviewed, and the pertinent orthopaedic findings are documented in the HPI.  Exam BP (!) 158/84  Ht 154.9 cm (5\' 1" )  Wt 62.1 kg (137 lb)  LMP (LMP Unknown) Comment: Hysterectomy  BMI 25.89 kg/m  General: Well-developed, well-nourished female seen in no acute distress. Antalgic gait without evidence of significant abductor lurch.  HEENT: Atraumatic, normocephalic. Pupils are equal and reactive to light. Extraocular motion is intact. Sclera are clear. Oropharynx is clear with moist mucosa.  Neck: Supple, nontender, and with good ROM. No thyromegaly, adenopathy, JVD, or carotid bruits.  Lungs: Clear to auscultation bilaterally.  Cardiovascular: Regular rate and rhythm. Normal S1, S2. No murmur . No appreciable gallops or rubs. Peripheral pulses are palpable. No lower extremity edema. Homan`s test is  negative.  Abdomen: Soft, nontender, nondistended. Bowel sounds are present.  Spine: Alignment: No gross scoliosis. Normal lumbar lordosis. Sacroiliac joints: Nontender to palpation Patrick`s test: Negative Flip test: Negative Tenderness: None Paraspinous spasm: None Range of motion: Good range of motion with both flexion and extension  Extremities: Good strength, stability, and range of motion of the upper extremities. Good range of motion of the knees and ankles.  Left Hip: Pelvic tilt: Negative Limb lengths: Equal with the patient standing Soft tissue swelling: Negative Erythema: Negative Crepitance: Negative Tenderness: Greater trochanter is nontender to palpation. Moderate pain is elicited by axial compression or extremes of rotation. Atrophy: No atrophy. Fair to good hip flexor and abductor strength. Range of Motion: EXT/FLEX: 0/0/100 ADD/ABD: 30/0/30 IR/ER: 20/0/20  Vascular: Peripheral pulses are palpable. Good capillary refill. No gross pretibial or ankle edema. Homans test is negative.  Neurologic: Awake, alert, and oriented. Sensory function is intact to pinprick and light touch. Motor strength is judged to be 5/5. Motor coordination is grossly within normal limits. No apparent clonus. No tremor. Deep tendon reflexes are symmetric.  X-rays: I ordered and interpreted AP, lateral, and oblique radiographs of the lumbar spine that were obtained in the office today. There is no significant scoliosis. Multilevel degenerative disc changes are present. Facet hypertrophy is noted.  I ordered and interpreted AP pelvis, AP and lateral radiographs of the left hip that were obtained in the office today. There is significant narrowing of the cartilage space. Subchondral sclerosis and subchondral cyst formation are noted. Osteophyte formation is present.  Impression: Degenerative arthrosis of the left hip  Plan: The findings were discussed in detail with the patient. The  patient was given informational material on total hip replacement. Conservative treatment options were reviewed with the patient. We discussed the risks and benefits of surgical intervention. The usual perioperative course was also discussed in detail. The patient expressed understanding of the risks and benefits of surgical intervention and would like to proceed with plans for left total hip arthroplasty.  I spent a total of  45 minutes in both face-to-face and non-face-to-face activities, excluding procedures performed, for this visit on the date of this encounter.  MEDICAL CLEARANCE: Per anesthesiology ACTIVITIES: As tolerated. WORK STATUS: Not applicable. THERAPY: Preoperative physical therapy evaluation. MEDICATIONS: Requested Prescriptions  No prescriptions requested or ordered in this encounter  FOLLOW-UP: Return for preop History & Physical pending surgery date.  Sarahi Borland P. Angie Fava., M.D.  This note was generated in part with voice recognition software and I apologize for any typographical errors that were not detected and corrected. Electronically signed by Shari Heritage., MD at 04/24/2023 12:30 PM EDT

## 2023-05-10 LAB — IGE: IgE (Immunoglobulin E), Serum: 11 IU/mL (ref 6–495)

## 2023-05-12 ENCOUNTER — Encounter: Payer: Self-pay | Admitting: Orthopedic Surgery

## 2023-05-12 NOTE — Progress Notes (Signed)
Perioperative / Anesthesia Services  Pre-Admission Testing Clinical Review / Preoperative Anesthesia Consult  Date: 05/12/23  Patient Demographics:  Name: Kristen Lloyd DOB:   November 01, 1941 MRN:   [id number]  Planned Surgical Procedure(s):    Case: [id number] Date/Time: 05/13/23 0700   Procedure: TOTAL HIP ARTHROPLASTY (Left: Hip)   Anesthesia type: Choice   Pre-op diagnosis: PRIMARY OSTEOARTHRITIS OF LEFT HIP.   Location: [facility] OR ROOM 01 / [facility] AN[facility]   Surgeons: Donato Heinz, MD     NOTE: Available PAT nursing documentation and vital signs have been reviewed. Clinical nursing staff has updated patient's PMH/PSHx, current medication list, and drug allergies/intolerances to ensure comprehensive history available to assist in medical decision making as it pertains to the aforementioned surgical procedure and anticipated anesthetic course. Extensive review of available clinical information personally performed. [place] PMH and PSHx updated with any diagnoses/procedures that  may have been inadvertently omitted during her intake with the pre-admission testing department's nursing staff.  Clinical Discussion:  Kristen Lloyd is a 82 y.o. female who is submitted for pre-surgical anesthesia review and clearance prior to her undergoing the above procedure. Patient has never been a smoker. Pertinent PMH includes: CAD, RIGHT cerebellar CVA, palpitations, unstable angina HTN, HLD, GERD (on daily H2 blocker + PPI), OA, lumbar DDD.   Patient is followed by cardiology Lady Gary, MD). She was last seen in the cardiology clinic on 04/21/2023; notes reviewed. At the time of her clinic visit, patient doing well overall from a cardiovascular perspective.  Patient with episodes of intermittent palpitations that were self-limiting and resolved quickly.  Patient denied any chest pain, shortness of breath, PND, orthopnea, significant peripheral edema, weakness, fatigue, vertiginous symptoms, or  presyncope/syncope. Patient with a past medical history significant for cardiovascular diagnoses. Documented physical exam was grossly benign, providing no evidence of acute exacerbation and/or decompensation of the patient's known cardiovascular conditions.  Patient presented for treatment with transient slurred speech. MRI imaging of the brain performed on 04/20/2014 revealed small acute RIGHT cerebellar infarcts.  Stress echocardiogram performed on 01/07/2017 revealed a normal left ventricular systolic function with an EF of >55%.  Right ventricular size and function normal.  There was mild mitral and tricuspid valve regurgitation.  All transvalvular gradients were noted to be normal providing no evidence suggestive of valvular stenosis.  Patient underwent diagnostic LEFT heart catheterization on 01/19/2017 revealing a normal left ventricular systolic function with an EF of 55%.  Multivessel CAD noted; 99% mid RCA, 5% mid LAD, 50% proximal-mid LAD, and 20% distal LAD.  Patient subsequently underwent PCI placing a 2.5 x 15 mm Xience Alpine DES x 1 to the mid RCA lesion yielding excellent angiographic result and TIMI-3 flow.  Blood pressure reasonably controlled at 130/70 mmHg without the use of pharmacological intervention.  Patient is on fenofibrate therapy for her HLD diagnosis and ASCVD prevention.  She has made specific request not to be on statin therapy.  Patient is not diabetic. Patient does not have an OSAH diagnosis. Functional capacity somewhat limited by age and arthritides (mainly LEFT hip).  With that being said, patient still felt to be able to achieve at least 4 METS of physical activity without experiencing any significant angina/anginal equivalent symptoms.  No changes were made to her medication regimen.  Patient to follow-up with outpatient cardiology in 6 months or sooner if needed.  Kristen Lloyd is scheduled for an TOTAL HIP ARTHROPLASTY (Left: Hip) on 05/13/2023 with Dr. Francesco Sor, MD.  Given patient's past medical history significant for cardiovascular diagnoses, presurgical cardiac clearance was sought by the PAT team. Per cardiology, "this patient is optimized for surgery and may proceed with the planned procedural course with a LOW risk of significant perioperative cardiovascular complications".  In review of her medication reconciliation, it is noted that patient is currently on prescribed daily antithrombotic therapy. She has been instructed on recommendations for holding her daily low-dose ASA for 5 days prior to her procedure with plans to restart as soon as postoperative bleeding risk felt to be minimized by her attending surgeon. The patient has been instructed that her last dose of her ASA should be on 05/07/2023.  Patient denies previous perioperative complications with anesthesia in the past. In review of the available records, it is noted that patient underwent a general anesthetic course [facility] (ASA III) in 07/2020 without documented complications.      05/06/2023   11:47 AM 09/24/2021    9:48 AM 09/24/2021    9:47 AM  Vitals with BMI  Height 5\' 1"  5\' 1"    Weight 136 lbs 5 oz 139 lbs   BMI 25.77 26.28   Systolic 147  142  Diastolic 78  74  Pulse 78  96    Providers/Specialists:   NOTE: Primary physician provider listed below. Patient may have been seen by APP or partner within same practice.   PROVIDER ROLE / SPECIALTY LAST OV  Hooten, Illene Labrador, MD Orthopedics (Surgeon) 04/19/2023  Mick Sell, MD Primary Care Provider 01/26/2023  Harold Hedge, MD Cardiology 04/01/2023   Allergies:  Flagyl [metronidazole], Levofloxacin, Moxifloxacin, and Septra [sulfamethoxazole-trimethoprim]  Current Home Medications:    acetaminophen (TYLENOL) 500 MG tablet   alum & mag hydroxide-simeth (MYLANTA) 200-200-20 MG/5ML suspension   aspirin 81 MG tablet   cholecalciferol (VITAMIN D) 1000 UNITS tablet   diclofenac Sodium (VOLTAREN ARTHRITIS  PAIN) 1 % GEL   docusate sodium (COLACE) 50 MG capsule   famotidine (PEPCID) 20 MG tablet   fenofibrate 160 MG tablet   fluticasone (FLONASE) 50 MCG/ACT nasal spray   ibuprofen (ADVIL) 200 MG tablet   omeprazole (PRILOSEC) 40 MG capsule   sodium chloride (OCEAN) 0.65 % SOLN nasal spray   traMADol (ULTRAM) 50 MG tablet   History:   Past Medical History:  Diagnosis Date   Angina pectoris (HCC)    Aortic atherosclerosis (HCC)    Arthritis    Atrophic vaginitis    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   GERD (gastroesophageal reflux disease)    HTN (hypertension)    Hyperlipemia    Osteopenia    Palpitations    Primary osteoarthritis of left hip    Shingles    Spastic colon    Past Surgical History:  Procedure Laterality Date   ABDOMINAL HYSTERECTOMY     APPENDECTOMY     BREAST BIOPSY Left 1990's   lt bx x 2-neg   CARDIAC CATHETERIZATION N/A 01/19/2017   Procedure: Left Heart Cath and Coronary Angiography;  Surgeon: Dalia Heading, MD;  Location: [facility] INVASIVE CV LAB;  Service: Cardiovascular;  Laterality: N/A;   CARDIAC CATHETERIZATION N/A 01/19/2017   Procedure: Coronary Stent Intervention;  Surgeon: Marcina Millard, MD;  Location: [facility] INVASIVE CV LAB;  Service: Cardiovascular;  Laterality: N/A;   Ileostomy and reversal N/A 2021   TONSILLECTOMY     Family History  Problem Relation Age of Onset   Breast cancer Cousin 82       mat cousin   Social History   Tobacco Use   Smoking status: Never   Smokeless tobacco: Never  Vaping Use   Vaping Use: Never used  Substance Use Topics   Alcohol use: Yes    Comment: 1 glass of martini   Drug use: No    Pertinent Clinical Results:  LABS:   No visits with results within 3 Day(s) from  this visit.  Latest known visit with results is:  Hospital Outpatient Visit on 05/06/2023  Component Date Value Ref Range Status   WBC 05/06/2023 6.4  4.0 - 10.5 K/uL Final   RBC 05/06/2023 3.96  3.87 - 5.11 MIL/uL Final   Hemoglobin 05/06/2023 12.8  12.0 - 15.0 g/dL Final   HCT 16/09/9603 39.3  36.0 - 46.0 % Final   MCV 05/06/2023 99.2  80.0 - 100.0 fL Final   MCH 05/06/2023 32.3  26.0 - 34.0 pg Final   MCHC 05/06/2023 32.6  30.0 - 36.0 g/dL Final   RDW 54/08/8118 14.5  11.5 - 15.5 % Final   Platelets 05/06/2023 199  150 - 400 K/uL Final   nRBC 05/06/2023 0.0  0.0 - 0.2 % Final   Performed at [facility], [address] [place], [place] [address]   ABO/RH(D) 05/06/2023 A POS   Final   Antibody Screen 05/06/2023 NEG   Final   Sample Expiration 05/06/2023 05/20/2023,2359   Final   Extend sample reason 05/06/2023    Final                   Value:NO TRANSFUSIONS OR PREGNANCY IN THE PAST 3 MONTHS Performed at [facility], [address] [place], [place] [address]    Sodium 05/06/2023 139  135 - 145 mmol/L Final   Potassium 05/06/2023 3.9  3.5 - 5.1 mmol/L Final   Chloride 05/06/2023 103  98 - 111 mmol/L Final   CO2 05/06/2023 26  22 - 32 mmol/L Final   Glucose, Bld 05/06/2023 78  70 - 99 mg/dL Final   Glucose reference range applies only to samples taken after fasting for at least 8 hours.   BUN 05/06/2023 17  8 - 23 mg/dL Final   Creatinine, Ser 05/06/2023 0.84  0.44 - 1.00 mg/dL Final   Calcium 30/86/5784 9.0  8.9 - 10.3 mg/dL Final   Total Protein 69/62/9528 6.6  6.5 - 8.1 g/dL Final   Albumin 41/32/4401 4.2  3.5 - 5.0 g/dL Final   AST 02/72/5366 16  15 - 41 U/L Final   ALT 05/06/2023 11  0 - 44 U/L Final   Alkaline Phosphatase 05/06/2023 61  38 - 126 U/L Final   Total Bilirubin 05/06/2023 0.9  0.3 - 1.2 mg/dL Final   GFR, Estimated 05/06/2023 >60  >60 mL/min Final   Comment: (NOTE) Calculated using the CKD-EPI Creatinine Equation (2021)    Anion gap  05/06/2023 10  5 - 15 Final   Performed at [facility], [address] [place], [place] [address]   Color, Urine 05/06/2023 STRAW (A)  YELLOW Final   APPearance 05/06/2023 CLEAR (A)  CLEAR Final   Specific Gravity, Urine 05/06/2023 1.004 (L)  1.005 - 1.030 Final   pH 05/06/2023 7.0  5.0 - 8.0 Final  Glucose, UA 05/06/2023 NEGATIVE  NEGATIVE mg/dL Final   Hgb urine dipstick 05/06/2023 NEGATIVE  NEGATIVE Final   Bilirubin Urine 05/06/2023 NEGATIVE  NEGATIVE Final   Ketones, ur 05/06/2023 NEGATIVE  NEGATIVE mg/dL Final   Protein, ur 16/09/9603 NEGATIVE  NEGATIVE mg/dL Final   Nitrite 54/08/8118 NEGATIVE  NEGATIVE Final   Leukocytes,Ua 05/06/2023 NEGATIVE  NEGATIVE Final   Performed at [facility], [address] [place], [place] [address]   MRSA, PCR 05/06/2023 NEGATIVE  NEGATIVE Final   Staphylococcus aureus 05/06/2023 POSITIVE (A)  NEGATIVE Final   Comment: (NOTE) The Xpert SA Assay (FDA approved for NASAL specimens in patients 40 years of age and older), is one component of a comprehensive surveillance program. It is not intended to diagnose infection nor to guide or monitor treatment. Performed at [facility], [address] [place], [place] [address]    CRP 05/06/2023 <0.5  <1.0 mg/dL Final   Performed at [facility], [address] [place], [place] [address]   Sed Rate 05/06/2023 11  0 - 30 mm/hr Final   Performed at [facility], [address] [place], [place] [address]   IgE (Immunoglobulin E), Serum 05/06/2023 11  6 - 495 IU/mL Final   Comment: (NOTE) Performed At: [facility] [address] [place], [place] [id number] Jolene Schimke MD KG:[id number]     ECG: Date: 05/06/2023 Time ECG obtained: 1348 PM Rate: 62 bpm Rhythm:  Sinus rhythm with sinus arrhythmia and PACs Axis (leads I and aVF): Normal Intervals: PR 158 ms. QRS 74 ms. QTc 420 ms. ST segment and T wave changes: No evidence of acute ST segment  elevation or depression Comparison: Previous inferolateral T wave inversions have resolved.  PACs now present.   IMAGING / PROCEDURES: LEFT HEART CATHETERIZATION AND CORONARY ANGIOGRAPHY performed on 01/19/2017 Left ventricular systolic function with an EF of 55%. Multivessel CAD Mid RCA lesion, 99 %stenosed. Mid LAD lesion, 5 %stenosed. Prox LAD to Mid LAD lesion, 50 %stenosed. Dist LAD lesion, 20 %stenosed. Successful PCI 2.5 x 15 mm Xience Alpine DES x 1 to the mid RCA yielding excellent angiographic result and TIMI-3 flow    STRESS ECHOCARDIOGRAM performed on 01/07/2017 Normal left ventricular systolic function with an EF of >55%.   No regional wall motion abnormalities. Mild mitral and tricuspid valve regurgitation Stress duration 6 minutes Maximum heart rate 123; MPHR achieved  Impression and Plan:  Kristen Lloyd has been referred for pre-anesthesia review and clearance prior to her undergoing the planned anesthetic and procedural courses. Available labs, pertinent testing, and imaging results were personally reviewed by me in preparation for upcoming operative/procedural course. [facility] medical record has been updated following extensive record review and patient interview with PAT staff.   This patient has been appropriately cleared by cardiology with an overall LOW risk of significant perioperative cardiovascular complications. Based on clinical review performed today (05/12/23), barring any significant acute changes in the patient's overall condition, it is anticipated that she will be able to proceed with the planned surgical intervention. Any acute changes in clinical condition may necessitate her procedure being postponed and/or cancelled. Patient will meet with anesthesia team (MD and/or CRNA) on the day of her procedure for preoperative evaluation/assessment. Questions regarding anesthetic course will be fielded at that time.   Pre-surgical instructions were reviewed  with the patient during her PAT appointment, and questions were fielded to satisfaction by PAT clinical staff. She has been instructed on which medications that  she will need to hold prior to surgery, as well as the ones that have been deemed safe/appropriate to take on the day of her procedure. As part of the general education provided by PAT, patient made aware both verbally and in writing, that she would need to abstain from the use of any illegal substances during her perioperative course.  She was advised that failure to follow the provided instructions could necessitate case cancellation or result in serious perioperative complications up to and including death. Patient encouraged to contact PAT and/or her surgeon's office to discuss any questions or concerns that may arise prior to surgery; verbalized understanding.   Quentin Mulling, MSN, APRN, FNP-C, CEN [facility]  Peri-operative Services Nurse Practitioner Phone: [phone] Fax: [phone] 05/12/23 10:23 AM  NOTE: This note has been prepared using Dragon dictation software. Despite my best ability to proofread, there is always the potential that unintentional transcriptional errors may still occur from this process.

## 2023-05-13 ENCOUNTER — Other Ambulatory Visit: Payer: Self-pay

## 2023-05-13 ENCOUNTER — Ambulatory Visit: Payer: Medicare Other | Admitting: Urgent Care

## 2023-05-13 ENCOUNTER — Observation Stay: Payer: Medicare Other

## 2023-05-13 ENCOUNTER — Encounter: Payer: Self-pay | Admitting: Orthopedic Surgery

## 2023-05-13 ENCOUNTER — Encounter
Admission: RE | Disposition: A | Discharge status: Home / Self Care | Payer: Self-pay | Source: Ambulatory Visit | Attending: Orthopedic Surgery

## 2023-05-13 ENCOUNTER — Observation Stay
Admission: RE | Admit: 2023-05-13 | Discharge: 2023-05-14 | Disposition: A | Discharge status: Home / Self Care | Payer: Medicare Other | Source: Ambulatory Visit | Attending: Orthopedic Surgery | Admitting: Orthopedic Surgery

## 2023-05-13 DIAGNOSIS — M1990 Unspecified osteoarthritis, unspecified site: Secondary | ICD-10-CM | POA: Insufficient documentation

## 2023-05-13 DIAGNOSIS — I10 Essential (primary) hypertension: Secondary | ICD-10-CM | POA: Insufficient documentation

## 2023-05-13 DIAGNOSIS — Z7982 Long term (current) use of aspirin: Secondary | ICD-10-CM | POA: Diagnosis not present

## 2023-05-13 DIAGNOSIS — I2510 Atherosclerotic heart disease of native coronary artery without angina pectoris: Secondary | ICD-10-CM | POA: Insufficient documentation

## 2023-05-13 DIAGNOSIS — Z8673 Personal history of transient ischemic attack (TIA), and cerebral infarction without residual deficits: Secondary | ICD-10-CM | POA: Insufficient documentation

## 2023-05-13 DIAGNOSIS — Z79899 Other long term (current) drug therapy: Secondary | ICD-10-CM | POA: Insufficient documentation

## 2023-05-13 DIAGNOSIS — I200 Unstable angina: Secondary | ICD-10-CM

## 2023-05-13 DIAGNOSIS — Z880 Allergy status to penicillin: Secondary | ICD-10-CM

## 2023-05-13 DIAGNOSIS — M1612 Unilateral primary osteoarthritis, left hip: Secondary | ICD-10-CM | POA: Diagnosis present

## 2023-05-13 DIAGNOSIS — Z96642 Presence of left artificial hip joint: Secondary | ICD-10-CM

## 2023-05-13 HISTORY — DX: Angina pectoris, unspecified: I20.9

## 2023-05-13 HISTORY — DX: Essential (primary) hypertension: I10

## 2023-05-13 HISTORY — DX: Postmenopausal atrophic vaginitis: N95.2

## 2023-05-13 HISTORY — DX: Gastro-esophageal reflux disease without esophagitis: K21.9

## 2023-05-13 HISTORY — DX: Other specified disorders of bone density and structure, unspecified site: M85.80

## 2023-05-13 HISTORY — DX: Unspecified osteoarthritis, unspecified site: M19.90

## 2023-05-13 HISTORY — PX: TOTAL HIP ARTHROPLASTY: SHX124

## 2023-05-13 HISTORY — DX: Irritable bowel syndrome, unspecified: K58.9

## 2023-05-13 HISTORY — DX: Palpitations: R00.2

## 2023-05-13 HISTORY — DX: Atherosclerosis of aorta: I70.0

## 2023-05-13 HISTORY — DX: Zoster without complications: B02.9

## 2023-05-13 LAB — ABO/RH: ABO/RH(D): A POS

## 2023-05-13 SURGERY — ARTHROPLASTY, HIP, TOTAL,POSTERIOR APPROACH
Anesthesia: Spinal | Site: Hip | Laterality: Left

## 2023-05-13 MED ORDER — GLYCOPYRROLATE 0.2 MG/ML IJ SOLN
INTRAMUSCULAR | Status: AC
  Filled 2023-05-13: qty 1

## 2023-05-13 MED ORDER — 0.9 % SODIUM CHLORIDE (POUR BTL) OPTIME
TOPICAL | Status: DC | PRN
  Administered 2023-05-13: 500 mL

## 2023-05-13 MED ORDER — PHENYLEPHRINE HCL-NACL 20-0.9 MG/250ML-% IV SOLN
INTRAVENOUS | Status: AC
  Filled 2023-05-13: qty 250

## 2023-05-13 MED ORDER — CELECOXIB 200 MG PO CAPS
200.0000 mg | ORAL_CAPSULE | Freq: Two times a day (BID) | ORAL | Status: DC
  Administered 2023-05-14: 200 mg via ORAL
  Filled 2023-05-13: qty 1

## 2023-05-13 MED ORDER — TRANEXAMIC ACID-NACL 1000-0.7 MG/100ML-% IV SOLN
INTRAVENOUS | Status: AC
  Filled 2023-05-13: qty 100

## 2023-05-13 MED ORDER — SURGIPHOR WOUND IRRIGATION SYSTEM - OPTIME: TOPICAL | Status: DC | PRN

## 2023-05-13 MED ORDER — SODIUM CHLORIDE 0.9 % IV SOLN: INTRAVENOUS | Status: DC

## 2023-05-13 MED ORDER — FENTANYL CITRATE (PF) 100 MCG/2ML IJ SOLN
INTRAMUSCULAR | Status: DC | PRN
  Administered 2023-05-13 (×4): 25 ug via INTRAVENOUS

## 2023-05-13 MED ORDER — BUPIVACAINE HCL (PF) 0.5 % IJ SOLN
INTRAMUSCULAR | Status: DC | PRN
  Administered 2023-05-13: 2.5 mL

## 2023-05-13 MED ORDER — CEFAZOLIN SODIUM-DEXTROSE 2-4 GM/100ML-% IV SOLN
INTRAVENOUS | Status: AC
  Filled 2023-05-13: qty 100

## 2023-05-13 MED ORDER — TRANEXAMIC ACID-NACL 1000-0.7 MG/100ML-% IV SOLN
1000.0000 mg | Freq: Once | INTRAVENOUS | Status: AC
  Administered 2023-05-13: 1000 mg via INTRAVENOUS

## 2023-05-13 MED ORDER — GABAPENTIN 300 MG PO CAPS
ORAL_CAPSULE | ORAL | Status: AC
  Filled 2023-05-13: qty 1

## 2023-05-13 MED ORDER — SENNOSIDES-DOCUSATE SODIUM 8.6-50 MG PO TABS
1.0000 | ORAL_TABLET | Freq: Two times a day (BID) | ORAL | Status: DC
  Administered 2023-05-13 – 2023-05-14 (×3): 1 via ORAL
  Filled 2023-05-13 (×3): qty 1

## 2023-05-13 MED ORDER — ONDANSETRON HCL 4 MG PO TABS: 4.0000 mg | ORAL_TABLET | Freq: Four times a day (QID) | ORAL | Status: DC | PRN

## 2023-05-13 MED ORDER — TRANEXAMIC ACID-NACL 1000-0.7 MG/100ML-% IV SOLN
1000.0000 mg | INTRAVENOUS | Status: AC
  Administered 2023-05-13: 1000 mg via INTRAVENOUS

## 2023-05-13 MED ORDER — DEXAMETHASONE SODIUM PHOSPHATE 10 MG/ML IJ SOLN
INTRAMUSCULAR | Status: AC
  Filled 2023-05-13: qty 1

## 2023-05-13 MED ORDER — DEXAMETHASONE SODIUM PHOSPHATE 10 MG/ML IJ SOLN
8.0000 mg | Freq: Once | INTRAMUSCULAR | Status: AC
  Administered 2023-05-13: 8 mg via INTRAVENOUS

## 2023-05-13 MED ORDER — PHENYLEPHRINE HCL-NACL 20-0.9 MG/250ML-% IV SOLN
INTRAVENOUS | Status: DC | PRN
  Administered 2023-05-13: 25 ug/min via INTRAVENOUS

## 2023-05-13 MED ORDER — ENSURE PRE-SURGERY PO LIQD
296.0000 mL | Freq: Once | ORAL | Status: DC
  Filled 2023-05-13: qty 296

## 2023-05-13 MED ORDER — SALINE SPRAY 0.65 % NA SOLN: 2.0000 | Freq: Two times a day (BID) | NASAL | Status: DC | PRN

## 2023-05-13 MED ORDER — CELECOXIB 200 MG PO CAPS
400.0000 mg | ORAL_CAPSULE | Freq: Once | ORAL | Status: AC
  Administered 2023-05-13: 400 mg via ORAL

## 2023-05-13 MED ORDER — PROPOFOL 1000 MG/100ML IV EMUL
INTRAVENOUS | Status: AC
  Filled 2023-05-13: qty 100

## 2023-05-13 MED ORDER — OXYCODONE HCL 5 MG/5ML PO SOLN: 5.0000 mg | Freq: Once | ORAL | Status: DC | PRN

## 2023-05-13 MED ORDER — SODIUM CHLORIDE 0.9 % IR SOLN
Status: DC | PRN
  Administered 2023-05-13: 3000 mL

## 2023-05-13 MED ORDER — ACETAMINOPHEN 10 MG/ML IV SOLN
INTRAVENOUS | Status: DC | PRN
  Administered 2023-05-13: 1000 mg via INTRAVENOUS

## 2023-05-13 MED ORDER — ACETAMINOPHEN 325 MG PO TABS: 325.0000 mg | ORAL_TABLET | Freq: Four times a day (QID) | ORAL | Status: DC | PRN

## 2023-05-13 MED ORDER — CHLORHEXIDINE GLUCONATE 0.12 % MT SOLN
OROMUCOSAL | Status: AC
  Filled 2023-05-13: qty 15

## 2023-05-13 MED ORDER — ORAL CARE MOUTH RINSE: 15.0000 mL | Freq: Once | OROMUCOSAL | Status: AC

## 2023-05-13 MED ORDER — CHLORHEXIDINE GLUCONATE 4 % EX SOLN
60.0000 mL | Freq: Once | CUTANEOUS | Status: DC
Start: 2023-05-13 — End: 2023-05-13

## 2023-05-13 MED ORDER — DIPHENHYDRAMINE HCL 12.5 MG/5ML PO ELIX: 12.5000 mg | ORAL_SOLUTION | ORAL | Status: DC | PRN

## 2023-05-13 MED ORDER — LACTATED RINGERS IV SOLN: INTRAVENOUS | Status: DC

## 2023-05-13 MED ORDER — PHENOL 1.4 % MT LIQD: 1.0000 | OROMUCOSAL | Status: DC | PRN

## 2023-05-13 MED ORDER — MENTHOL 3 MG MT LOZG: 1.0000 | LOZENGE | OROMUCOSAL | Status: DC | PRN

## 2023-05-13 MED ORDER — FENTANYL CITRATE (PF) 100 MCG/2ML IJ SOLN
INTRAMUSCULAR | Status: AC
  Filled 2023-05-13: qty 2

## 2023-05-13 MED ORDER — PROPOFOL 500 MG/50ML IV EMUL
INTRAVENOUS | Status: DC | PRN
  Administered 2023-05-13: 50 ug/kg/min via INTRAVENOUS

## 2023-05-13 MED ORDER — BISACODYL 10 MG RE SUPP: 10.0000 mg | Freq: Every day | RECTAL | Status: DC | PRN

## 2023-05-13 MED ORDER — TRAMADOL HCL 50 MG PO TABS
50.0000 mg | ORAL_TABLET | ORAL | Status: DC | PRN
  Administered 2023-05-13: 100 mg via ORAL
  Administered 2023-05-13: 50 mg via ORAL
  Filled 2023-05-13: qty 2
  Filled 2023-05-13: qty 1

## 2023-05-13 MED ORDER — OXYCODONE HCL 5 MG PO TABS: 10.0000 mg | ORAL_TABLET | ORAL | Status: DC | PRN

## 2023-05-13 MED ORDER — GABAPENTIN 300 MG PO CAPS
300.0000 mg | ORAL_CAPSULE | Freq: Once | ORAL | Status: AC
  Administered 2023-05-13: 300 mg via ORAL

## 2023-05-13 MED ORDER — ACETAMINOPHEN 10 MG/ML IV SOLN
1000.0000 mg | Freq: Four times a day (QID) | INTRAVENOUS | Status: AC
  Administered 2023-05-13 – 2023-05-14 (×4): 1000 mg via INTRAVENOUS
  Filled 2023-05-13 (×4): qty 100

## 2023-05-13 MED ORDER — CEFAZOLIN SODIUM-DEXTROSE 2-4 GM/100ML-% IV SOLN
2.0000 g | Freq: Four times a day (QID) | INTRAVENOUS | Status: AC
  Administered 2023-05-13 (×2): 2 g via INTRAVENOUS
  Filled 2023-05-13 (×2): qty 100

## 2023-05-13 MED ORDER — ALUM & MAG HYDROXIDE-SIMETH 200-200-20 MG/5ML PO SUSP
30.0000 mL | ORAL | Status: DC | PRN
  Administered 2023-05-13: 30 mL via ORAL
  Filled 2023-05-13: qty 30

## 2023-05-13 MED ORDER — CELECOXIB 200 MG PO CAPS
ORAL_CAPSULE | ORAL | Status: AC
  Filled 2023-05-13: qty 2

## 2023-05-13 MED ORDER — HYDROMORPHONE HCL 1 MG/ML IJ SOLN: 0.5000 mg | INTRAMUSCULAR | Status: DC | PRN

## 2023-05-13 MED ORDER — METOCLOPRAMIDE HCL 5 MG PO TABS
10.0000 mg | ORAL_TABLET | Freq: Three times a day (TID) | ORAL | Status: DC
  Administered 2023-05-13 – 2023-05-14 (×4): 10 mg via ORAL
  Filled 2023-05-13 (×4): qty 2

## 2023-05-13 MED ORDER — OXYCODONE HCL 5 MG PO TABS
5.0000 mg | ORAL_TABLET | ORAL | Status: DC | PRN
  Administered 2023-05-14: 5 mg via ORAL
  Filled 2023-05-13: qty 1

## 2023-05-13 MED ORDER — ONDANSETRON HCL 4 MG/2ML IJ SOLN: 4.0000 mg | Freq: Four times a day (QID) | INTRAMUSCULAR | Status: DC | PRN

## 2023-05-13 MED ORDER — MAGNESIUM HYDROXIDE 400 MG/5ML PO SUSP
30.0000 mL | Freq: Every day | ORAL | Status: DC
  Administered 2023-05-13 – 2023-05-14 (×2): 30 mL via ORAL
  Filled 2023-05-13 (×2): qty 30

## 2023-05-13 MED ORDER — PANTOPRAZOLE SODIUM 40 MG PO TBEC
40.0000 mg | DELAYED_RELEASE_TABLET | Freq: Two times a day (BID) | ORAL | Status: DC
  Administered 2023-05-13 – 2023-05-14 (×3): 40 mg via ORAL
  Filled 2023-05-13 (×3): qty 1

## 2023-05-13 MED ORDER — PROPOFOL 10 MG/ML IV BOLUS
INTRAVENOUS | Status: DC | PRN
  Administered 2023-05-13: 10 mg via INTRAVENOUS
  Administered 2023-05-13: 20 mg via INTRAVENOUS
  Administered 2023-05-13: 10 mg via INTRAVENOUS

## 2023-05-13 MED ORDER — CEFAZOLIN SODIUM-DEXTROSE 2-4 GM/100ML-% IV SOLN
2.0000 g | INTRAVENOUS | Status: AC
  Administered 2023-05-13: 2 g via INTRAVENOUS

## 2023-05-13 MED ORDER — CHLORHEXIDINE GLUCONATE 0.12 % MT SOLN
15.0000 mL | Freq: Once | OROMUCOSAL | Status: AC
  Administered 2023-05-13: 15 mL via OROMUCOSAL

## 2023-05-13 MED ORDER — FLUTICASONE PROPIONATE 50 MCG/ACT NA SUSP: 1.0000 | Freq: Two times a day (BID) | NASAL | Status: DC | PRN

## 2023-05-13 MED ORDER — FENTANYL CITRATE (PF) 100 MCG/2ML IJ SOLN: 25.0000 ug | INTRAMUSCULAR | Status: DC | PRN

## 2023-05-13 MED ORDER — FENOFIBRATE 160 MG PO TABS
80.0000 mg | ORAL_TABLET | Freq: Every day | ORAL | Status: DC
  Administered 2023-05-13 – 2023-05-14 (×2): 80 mg via ORAL
  Filled 2023-05-13 (×2): qty 0.5

## 2023-05-13 MED ORDER — FLEET ENEMA 7-19 GM/118ML RE ENEM: 1.0000 | ENEMA | Freq: Once | RECTAL | Status: DC | PRN

## 2023-05-13 MED ORDER — DEXMEDETOMIDINE HCL IN NACL 80 MCG/20ML IV SOLN
INTRAVENOUS | Status: DC | PRN
  Administered 2023-05-13 (×2): 4 ug via INTRAVENOUS

## 2023-05-13 MED ORDER — DEXMEDETOMIDINE HCL IN NACL 80 MCG/20ML IV SOLN
INTRAVENOUS | Status: AC
  Filled 2023-05-13: qty 20

## 2023-05-13 MED ORDER — ENOXAPARIN SODIUM 40 MG/0.4ML IJ SOSY
40.0000 mg | PREFILLED_SYRINGE | INTRAMUSCULAR | Status: DC
  Filled 2023-05-13: qty 0.4

## 2023-05-13 MED ORDER — OXYCODONE HCL 5 MG PO TABS: 5.0000 mg | ORAL_TABLET | Freq: Once | ORAL | Status: DC | PRN

## 2023-05-13 SURGICAL SUPPLY — 55 items
BLADE SAW 90X25X1.19 OSCILLAT (BLADE) ×1 IMPLANT
CUP ACETBLR 52 OD 100 SERIES (Hips) IMPLANT
DRAPE 3/4 80X56 (DRAPES) ×1 IMPLANT
DRAPE INCISE IOBAN 66X60 STRL (DRAPES) ×1 IMPLANT
DRSG AQUACEL AG ADV 3.5X14 (GAUZE/BANDAGES/DRESSINGS) ×1 IMPLANT
DRSG DERMACEA 8X12 NADH (GAUZE/BANDAGES/DRESSINGS) IMPLANT
DRSG MEPILEX SACRM 8.7X9.8 (GAUZE/BANDAGES/DRESSINGS) ×1 IMPLANT
DRSG TEGADERM 4X4.75 (GAUZE/BANDAGES/DRESSINGS) ×1 IMPLANT
DRSG TELFA 3X4 N-ADH STERILE (GAUZE/BANDAGES/DRESSINGS) ×1 IMPLANT
DURAPREP 26ML APPLICATOR (WOUND CARE) ×2 IMPLANT
ELECT CAUTERY BLADE 6.4 (BLADE) ×1 IMPLANT
ELECT REM PT RETURN 9FT ADLT (ELECTROSURGICAL) ×1
ELECTRODE REM PT RTRN 9FT ADLT (ELECTROSURGICAL) ×1 IMPLANT
GLOVE BIOGEL M STRL SZ7.5 (GLOVE) ×2 IMPLANT
GLOVE SRG 8 PF TXTR STRL LF DI (GLOVE) ×2 IMPLANT
GLOVE SURG SYN 8.0 (GLOVE) ×2 IMPLANT
GLOVE SURG SYN 8.0 PF PI (GLOVE) ×2 IMPLANT
GLOVE SURG UNDER POLY LF SZ8 (GLOVE) ×2
GOWN STRL REUS W/ TWL LRG LVL3 (GOWN DISPOSABLE) ×2 IMPLANT
GOWN STRL REUS W/ TWL XL LVL3 (GOWN DISPOSABLE) ×1 IMPLANT
GOWN STRL REUS W/TWL LRG LVL3 (GOWN DISPOSABLE) ×2
GOWN STRL REUS W/TWL XL LVL3 (GOWN DISPOSABLE) ×1
GOWN TOGA ZIPPER T7+ PEEL AWAY (MISCELLANEOUS) ×1 IMPLANT
HANDLE YANKAUER SUCT OPEN TIP (MISCELLANEOUS) ×1 IMPLANT
HEAD M SROM 36MM PLUS 1.5 (Hips) IMPLANT
HEMOVAC 400CC 10FR (MISCELLANEOUS) ×1 IMPLANT
HOLDER FOLEY CATH W/STRAP (MISCELLANEOUS) ×1 IMPLANT
HOOD PEEL AWAY T7 (MISCELLANEOUS) ×1 IMPLANT
IV NS IRRIG 3000ML ARTHROMATIC (IV SOLUTION) ×1 IMPLANT
KIT PEG BOARD PINK (KITS) ×1 IMPLANT
KIT TURNOVER KIT A (KITS) ×1 IMPLANT
LINER MARATHON 4MM 10DEG 36X52 (Hips) IMPLANT
MANIFOLD NEPTUNE II (INSTRUMENTS) ×2 IMPLANT
NS IRRIG 500ML POUR BTL (IV SOLUTION) ×1 IMPLANT
PACK HIP PROSTHESIS (MISCELLANEOUS) ×1 IMPLANT
PIN STEIN THRED 5/32 (Pin) IMPLANT
PULSAVAC PLUS IRRIG FAN TIP (DISPOSABLE) ×1
SOL PREP PVP 2OZ (MISCELLANEOUS) ×1
SOLUTION IRRIG SURGIPHOR (IV SOLUTION) ×1 IMPLANT
SOLUTION PREP PVP 2OZ (MISCELLANEOUS) ×1 IMPLANT
SPONGE DRAIN TRACH 4X4 STRL 2S (GAUZE/BANDAGES/DRESSINGS) ×1 IMPLANT
SROM M HEAD 36MM PLUS 1.5 (Hips) ×1 IMPLANT
STAPLER SKIN PROX 35W (STAPLE) ×1 IMPLANT
STEM FEM ACTIS HIGH SZ3 (Stem) IMPLANT
SUT ETHIBOND #5 BRAIDED 30INL (SUTURE) ×1 IMPLANT
SUT VIC AB 0 CT1 36 (SUTURE) ×1 IMPLANT
SUT VIC AB 1 CT1 36 (SUTURE) ×2 IMPLANT
SUT VIC AB 2-0 CT1 27 (SUTURE) ×1
SUT VIC AB 2-0 CT1 TAPERPNT 27 (SUTURE) ×1 IMPLANT
TAPE CLOTH 3X10 WHT NS LF (GAUZE/BANDAGES/DRESSINGS) ×1 IMPLANT
TIP FAN IRRIG PULSAVAC PLUS (DISPOSABLE) ×1 IMPLANT
TOWEL OR 17X26 4PK STRL BLUE (TOWEL DISPOSABLE) IMPLANT
TRAP FLUID SMOKE EVACUATOR (MISCELLANEOUS) ×1 IMPLANT
TRAY FOLEY MTR SLVR 16FR STAT (SET/KITS/TRAYS/PACK) ×1 IMPLANT
WATER STERILE IRR 1000ML POUR (IV SOLUTION) ×1 IMPLANT

## 2023-05-13 NOTE — Plan of Care (Signed)
  Problem: Education: Goal: Understanding of discharge needs will improve Outcome: Progressing   Problem: Activity: Goal: Ability to avoid complications of mobility impairment will improve Outcome: Progressing Goal: Ability to tolerate increased activity will improve Outcome: Progressing   Problem: Clinical Measurements: Goal: Postoperative complications will be avoided or minimized Outcome: Progressing   Problem: Pain Management: Goal: Pain level will decrease with appropriate interventions Outcome: Progressing   Problem: Skin Integrity: Goal: Will show signs of wound healing Outcome: Progressing   Problem: Education: Goal: Knowledge of General Education information will improve Description: Including pain rating scale, medication(s)/side effects and non-pharmacologic comfort measures Outcome: Progressing   Problem: Clinical Measurements: Goal: Ability to maintain clinical measurements within normal limits will improve Outcome: Progressing Goal: Will remain free from infection Outcome: Progressing   Problem: Nutrition: Goal: Adequate nutrition will be maintained Outcome: Progressing   Problem: Elimination: Goal: Will not experience complications related to bowel motility Outcome: Progressing Goal: Will not experience complications related to urinary retention Outcome: Progressing   Problem: Pain Managment: Goal: General experience of comfort will improve Outcome: Progressing   Problem: Safety: Goal: Ability to remain free from injury will improve Outcome: Progressing   Problem: Skin Integrity: Goal: Risk for impaired skin integrity will decrease Outcome: Progressing

## 2023-05-13 NOTE — TOC Progression Note (Signed)
Transition of Care [facility]) - Progression Note    Patient Details  Name: Kristen Lloyd MRN: [id number] Date of Birth: Feb 05, 1941  Transition of Care [facility]) CM/SW Contact  Marlowe Sax, RN Phone Number: 05/13/2023, 9:34 AM  Clinical Narrative:   Set up with [facility] for [facility] prior to Surgery by Surgeons office         Expected Discharge Plan and Services                                               Social Determinants of Health (SDOH) Interventions SDOH Screenings   Tobacco Use: Low Risk  (05/13/2023)    Readmission Risk Interventions     No data to display

## 2023-05-13 NOTE — Transfer of Care (Signed)
Immediate Anesthesia Transfer of Care Note  Patient: Kristen Lloyd  Procedure(s) Performed: TOTAL HIP ARTHROPLASTY (Left: Hip)  Patient Location: PACU  Anesthesia Type:Spinal  Level of Consciousness: drowsy  Airway & Oxygen Therapy: Patient Spontanous Breathing and Patient connected to face mask oxygen  Post-op Assessment: Report given to RN and Post -op Vital signs reviewed and stable  Post vital signs: Reviewed and stable  Last Vitals:  Vitals Value Taken Time  BP 96/65 05/13/23 1100  Temp    Pulse 111 05/13/23 1103  Resp 32 05/13/23 1103  SpO2 100 % 05/13/23 1103  Vitals shown include unvalidated device data.  Last Pain:  Vitals:   05/13/23 0629  TempSrc: Temporal  PainSc: 0-No pain         Complications: No notable events documented.

## 2023-05-13 NOTE — Anesthesia Preprocedure Evaluation (Addendum)
Anesthesia Evaluation  Patient identified by MRN, date of birth, ID band Patient awake    Reviewed: Allergy & Precautions, NPO status , Patient's Chart, lab work & pertinent test results  History of Anesthesia Complications Negative for: history of anesthetic complications  Airway Mallampati: III  TM Distance: <3 FB Neck ROM: full    Dental  (+) Chipped   Pulmonary neg pulmonary ROS, neg shortness of breath   Pulmonary exam normal        Cardiovascular Exercise Tolerance: Good hypertension, (-) angina + CAD and + Cardiac Stents  Normal cardiovascular exam     Neuro/Psych TIA negative psych ROS   GI/Hepatic Neg liver ROS,GERD  Controlled,,  Endo/Other  negative endocrine ROS    Renal/GU      Musculoskeletal  (+) Arthritis ,    Abdominal   Peds  Hematology negative hematology ROS (+)   Anesthesia Other Findings Patient has cardiac clearance for this procedure.   Past Medical History: No date: Angina pectoris (HCC) No date: Aortic atherosclerosis (HCC) No date: Arthritis No date: Atrophic vaginitis 04/20/2014: Cerebral infarction involving right cerebellar artery  (HCC) No date: Coronary artery disease     Comment:  a.) LHC/PCI 01/19/2017: 99% mRCA (2.5 x 15 mm Xience               Alpine DES), 5% mLAD, 50% p-mLAD, 20% dLAD No date: Diverticulitis     Comment:  a.) s/p ileostomy; has been reversed at this point. No date: GERD (gastroesophageal reflux disease) No date: HTN (hypertension) No date: Hyperlipemia No date: Osteopenia No date: Palpitations No date: Primary osteoarthritis of left hip No date: Shingles No date: Spastic colon  Past Surgical History: No date: ABDOMINAL HYSTERECTOMY No date: APPENDECTOMY 1990's: BREAST BIOPSY; Left     Comment:  lt bx x 2-neg 01/19/2017: CARDIAC CATHETERIZATION; N/A     Comment:  Procedure: Left Heart Cath and Coronary Angiography;                Surgeon:  Dalia Heading, MD;  Location: [facility] INVASIVE CV               LAB;  Service: Cardiovascular;  Laterality: N/A; 01/19/2017: CARDIAC CATHETERIZATION; N/A     Comment:  Procedure: Coronary Stent Intervention;  Surgeon:               Marcina Millard, MD;  Location: [facility] INVASIVE CV LAB;              Service: Cardiovascular;  Laterality: N/A; 2021: Ileostomy and reversal; N/A No date: TONSILLECTOMY  BMI    Body Mass Index: 25.70 kg/m      Reproductive/Obstetrics negative OB ROS                             Anesthesia Physical Anesthesia Plan  ASA: 3  Anesthesia Plan: Spinal   Post-op Pain Management:    Induction:   PONV Risk Score and Plan:   Airway Management Planned: Natural Airway and Nasal Cannula  Additional Equipment:   Intra-op Plan:   Post-operative Plan:   Informed Consent: I have reviewed the patients History and Physical, chart, labs and discussed the procedure including the risks, benefits and alternatives for the proposed anesthesia with the patient or authorized representative who has indicated his/her understanding and acceptance.     Dental Advisory Given  Plan Discussed with: Anesthesiologist, CRNA and Surgeon  Anesthesia Plan Comments: (Patient reports no  bleeding problems and no anticoagulant use.  Plan for spinal with backup GA  Patient consented for risks of anesthesia including but not limited to:  - adverse reactions to medications - damage to eyes, teeth, lips or other oral mucosa - nerve damage due to positioning  - risk of bleeding, infection and or nerve damage from spinal that could lead to paralysis - risk of headache or failed spinal - damage to teeth, lips or other oral mucosa - sore throat or hoarseness - damage to heart, brain, nerves, lungs, other parts of body or loss of life  Patient voiced understanding.)       Anesthesia Quick Evaluation

## 2023-05-13 NOTE — Op Note (Signed)
OPERATIVE NOTE  DATE OF SURGERY:  05/13/2023  PATIENT NAME:  Kristen Lloyd   DOB: 1941/11/10  MRN: [id number]  PRE-OPERATIVE DIAGNOSIS: Degenerative arthrosis of the left hip, primary  POST-OPERATIVE DIAGNOSIS:  Same  PROCEDURE:  Left total hip arthroplasty  SURGEON:  Jena Gauss. M.D.  ASSISTANT:  Gean Birchwood, PA-C (present and scrubbed throughout the case, critical for assistance with exposure, retraction, instrumentation, and closure)  ANESTHESIA: spinal  ESTIMATED BLOOD LOSS: 100 mL  FLUIDS REPLACED: 1200 mL of crystalloid  DRAINS: 2 medium Hemovac drains  IMPLANTS UTILIZED: DePuy size 3 high offset Actis femoral stem, 52 mm OD Pinnacle 100 acetabular component, +4 mm 10 degree Pinnacle Marathon polyethylene insert, and a 36 mm M-SPEC +1.5 mm hip ball  INDICATIONS FOR SURGERY: Kristen Lloyd is a 82 y.o. year old female with a long history of progressive hip and groin  pain. X-rays demonstrated severe degenerative changes. The patient had not seen any significant improvement despite conservative nonsurgical intervention. After discussion of the risks and benefits of surgical intervention, the patient expressed understanding of the risks benefits and agree with plans for total hip arthroplasty.   The risks, benefits, and alternatives were discussed at length including but not limited to the risks of infection, bleeding, nerve injury, stiffness, blood clots, the need for revision surgery, limb length inequality, dislocation, cardiopulmonary complications, among others, and they were willing to proceed.  PROCEDURE IN DETAIL: The patient was brought into the operating room and, after adequate spinal anesthesia was achieved, the patient was placed in a right lateral decubitus position. Axillary roll was placed and all bony prominences were well-padded. The patient's left hip was cleaned and prepped with alcohol and DuraPrep and draped in the usual sterile fashion. A "timeout"  was performed as per usual protocol. A lateral curvilinear incision was made gently curving towards the posterior superior iliac spine. The IT band was incised in line with the skin incision and the fibers of the gluteus maximus were split in line. The piriformis tendon was identified, skeletonized, and incised at its insertion to the proximal femur and reflected posteriorly. A T type posterior capsulotomy was performed. Prior to dislocation of the femoral head, a threaded Steinmann pin was inserted through a separate stab incision into the pelvis superior to the acetabulum and bent in the form of a stylus so as to assess limb length and hip offset throughout the procedure. The femoral head was then dislocated posteriorly. Inspection of the femoral head demonstrated severe degenerative changes with full-thickness loss of articular cartilage. The femoral neck cut was performed using an oscillating saw. The anterior capsule was elevated off of the femoral neck using a periosteal elevator. Attention was then directed to the acetabulum. The remnant of the labrum was excised using electrocautery. Inspection of the acetabulum also demonstrated significant degenerative changes. The acetabulum was reamed in sequential fashion up to a 51 mm diameter. Good punctate bleeding bone was encountered. A 52 mm Pinnacle 100 acetabular component was positioned and impacted into place. Good scratch fit was appreciated. A +4 mm neutral polyethylene trial was inserted.  Attention was then directed to the proximal femur.  Femoral broaches were inserted in a sequential fashion up to a size 3 broach. Calcar region was planed and a trial reduction was performed using a standard offset neck and a 36 mm hip ball with a +1.5 mm neck length.  There was slight decrease in offset and fair stability.  It was elected to trial with  the high offset neck and a +4 mm 10 degree polyethylene insert with the high side position at the 5 o'clock position.   Good equalization of limb lengths and hip offset was appreciated and excellent stability was noted both anteriorly and posteriorly. Trial components were removed. The acetabular shell was irrigated with copious amounts of normal saline with antibiotic solution and suctioned dry. A +4 mm 10 degree Pinnacle Marathon polyethylene insert was positioned with the high side at the 5 o'clock position and impacted into place. Next, a size 3 high offset Actis femoral stem was positioned and impacted into place. Excellent scratch fit was appreciated. A trial reduction was again performed with a 36 mm hip ball with a +1.5 mm neck length. Again, good equalization of limb lengths was appreciated and excellent stability appreciated both anteriorly and posteriorly. The hip was then dislocated and the trial hip ball was removed. The Morse taper was cleaned and dried. A 36 mm M-SPEC hip ball with a +1.5 mm neck length was placed on the trunnion and impacted into place. The hip was then reduced and placed through range of motion. Excellent stability was appreciated both anteriorly and posteriorly.  The wound was irrigated with copious amounts of normal saline followed by 450 ml of Surgiphor and suctioned dry. Good hemostasis was appreciated. The posterior capsulotomy was repaired using #5 Ethibond. Piriformis tendon was reapproximated to the undersurface of the gluteus medius tendon using #5 Ethibond. The IT band was reapproximated using interrupted sutures of #1 Vicryl. Subcutaneous tissue was approximated using first #0 Vicryl followed by #2-0 Vicryl. The skin was closed with skin staples.  The patient tolerated the procedure well and was transported to the recovery room in stable condition.   Jena Gauss., M.D.

## 2023-05-13 NOTE — Evaluation (Signed)
Occupational Therapy Evaluation Patient Details Name: Kristen Lloyd MRN: [id number] DOB: 1941/08/25 Today's Date: 05/13/2023   History of Present Illness Pt is an 82 yo F diagnosed with degenerative arthrosis of the left hip and is s/p elective L THA.  PMH includes: HTN, CAD, osteopenia, and CVA.   Clinical Impression   Pt seen for OT evaluation this date, POD#1 from above surgery. Pt was independent in all ADL prior to surgery and is eager to return to PLOF with less pain and improved safety and independence. Pt currently requires MIN-MOD assist for LB dressing and bathing while in seated position due to pain and limited AROM of L hip. Pt able to recall 1/3 posterior total hip precautions at start of session and unable to verbalize how to implement during ADL and mobility. Pt instructed in posterior total hip precautions and how to implement, self care skills, falls prevention strategies, home/routines modifications, DME/AE for LB bathing and dressing tasks, and facilitated problem solving for bed mobility and comfort with BLE positioning. Handout provided. At end of session, pt able to recall 2/3 posterior total hip precautions. Pt would benefit from additional instruction in self care skills and techniques to help maintain precautions with or without assistive devices to support recall and carryover prior to discharge.      Recommendations for follow up therapy are one component of a multi-disciplinary discharge planning process, led by the attending physician.  Recommendations may be updated based on patient status, additional functional criteria and insurance authorization.   Assistance Recommended at Discharge Intermittent Supervision/Assistance  Patient can return home with the following A little help with walking and/or transfers;A little help with bathing/dressing/bathroom;Assistance with cooking/housework;Assist for transportation;Help with stairs or ramp for entrance    Functional  Status Assessment  Patient has had a recent decline in their functional status and demonstrates the ability to make significant improvements in function in a reasonable and predictable amount of time.  Equipment Recommendations  None recommended by OT    Recommendations for Other Services       Precautions / Restrictions Precautions Precautions: Posterior Hip;Fall Precaution Booklet Issued: Yes (comment) Restrictions Weight Bearing Restrictions: Yes LLE Weight Bearing: Weight bearing as tolerated      Mobility Bed Mobility               General bed mobility comments: NT, pt found and left in recliner    Transfers Overall transfer level: Needs assistance Equipment used: Rolling walker (2 wheels) Transfers: Sit to/from Stand Sit to Stand: Min guard           General transfer comment: VC for hip precautions sequencing      Balance Overall balance assessment: Needs assistance Sitting-balance support: Single extremity supported, Feet unsupported Sitting balance-Leahy Scale: Good     Standing balance support: Bilateral upper extremity supported, During functional activity, Reliant on assistive device for balance Standing balance-Leahy Scale: Fair                             ADL either performed or assessed with clinical judgement   ADL                                         General ADL Comments: Pt currently requires MIN-MOD A for LB ADL tasks and CGA for ADL transfers with RW with VC for maintaining  posterior hip precautions. Famiyl able to provide needed level of assist.     Vision         Perception     Praxis      Pertinent Vitals/Pain Pain Assessment Pain Assessment: 0-10 Pain Score: 5  Pain Location: L hip Pain Descriptors / Indicators: Burning Pain Intervention(s): Limited activity within patient's tolerance, Monitored during session, Premedicated before session, Repositioned, Ice applied     Hand Dominance      Extremity/Trunk Assessment Upper Extremity Assessment Upper Extremity Assessment: Overall WFL for tasks assessed   Lower Extremity Assessment Lower Extremity Assessment: Generalized weakness;Defer to PT evaluation;LLE deficits/detail LLE Deficits / Details: BLE ankle strength, AROM, and sensation to light touch grossly WNL LLE: Unable to fully assess due to pain LLE Sensation: WNL       Communication Communication Communication: No difficulties   Cognition Arousal/Alertness: Awake/alert Behavior During Therapy: WFL for tasks assessed/performed Overall Cognitive Status: Within Functional Limits for tasks assessed                                       General Comments       Exercises Other Exercises Other Exercises: Pt/family educated in hip precautions and how to maintain during LB ADL, ADL mobility, facilitated problem solving for bed mobility and comfort with BLE positioning, and AE/DME. Handout provided.   Shoulder Instructions      Home Living Family/patient expects to be discharged to:: Private residence Living Arrangements: Spouse/significant other Available Help at Discharge: Family;Available 24 hours/day Type of Home: House Home Access: Stairs to enter [employer] of Steps: 3 Entrance Stairs-Rails: Right;Left;Can reach both Home Layout: One level               Home Equipment: Agricultural consultant (2 wheels);BSC/3in1;Cane - single point          Prior Functioning/Environment Prior Level of Function : Independent/Modified Independent             Mobility Comments: Mod Ind amb community distances with a SPC, no fall history ADLs Comments: Ind with ADLs        OT Problem List: Decreased strength;Pain;Decreased range of motion;Decreased knowledge of use of DME or AE;Decreased knowledge of precautions;Impaired balance (sitting and/or standing)      OT Treatment/Interventions: Self-care/ADL training;Therapeutic  exercise;Therapeutic activities;DME and/or AE instruction;Patient/family education;Balance training    OT Goals(Current goals can be found in the care plan section) Acute Rehab OT Goals Patient Stated Goal: go home OT Goal Formulation: With patient/family Time For Goal Achievement: 05/27/23 Potential to Achieve Goals: Good ADL Goals Pt Will Perform Lower Body Dressing: with caregiver independent in assisting;with adaptive equipment;sit to/from stand Pt Will Transfer to Toilet: with supervision;ambulating (LRAD, BSC over toilet) Pt Will Perform Toileting - Clothing Manipulation and hygiene: with modified independence Additional ADL Goal #1: Pt will verbalize 3/3 posterior hip precautions and how to maintain during LB ADL and ADL transfers.  OT Frequency: Min 1X/week    Co-evaluation              AM-PAC OT "6 Clicks" Daily Activity     Outcome Measure Help from another person eating meals?: None Help from another person taking care of personal grooming?: None Help from another person toileting, which includes using toliet, bedpan, or urinal?: A Little Help from another person bathing (including washing, rinsing, drying)?: A Little Help from another person to put on and  taking off regular upper body clothing?: None Help from another person to put on and taking off regular lower body clothing?: A Little 6 Click Score: 21   End of Session    Activity Tolerance: Patient tolerated treatment well Patient left: in chair;with call bell/phone within reach;with chair alarm set;with family/visitor present  OT Visit Diagnosis: Other abnormalities of gait and mobility (R26.89)                Time: 1610-9604 OT Time Calculation (min): 37 min Charges:  OT General Charges $OT Visit: 1 Visit OT Evaluation $OT Eval Low Complexity: 1 Low OT Treatments $Self Care/Home Management : 23-37 mins  Arman Filter., MPH, MS, OTR/L ascom [phone] 05/13/23, 5:06 PM

## 2023-05-13 NOTE — Anesthesia Procedure Notes (Signed)
Date/Time: 05/13/2023 7:48 AM  Performed by: Malva Cogan, CRNAPre-anesthesia Checklist: Patient identified, Emergency Drugs available, Suction available, Patient being monitored and Timeout performed Patient Re-evaluated:Patient Re-evaluated prior to induction Oxygen Delivery Method: Simple face mask Induction Type: IV induction Placement Confirmation: CO2 detector and positive ETCO2

## 2023-05-13 NOTE — Anesthesia Procedure Notes (Signed)
Spinal  Patient location during procedure: OR Start time: 05/13/2023 7:22 AM End time: 05/13/2023 7:28 AM Reason for block: surgical anesthesia Staffing Performed: resident/CRNA  Resident/CRNA: Malva Cogan, CRNA Performed by: Malva Cogan, CRNA Authorized by: Rosaria Ferries, MD   Preanesthetic Checklist Completed: patient identified, IV checked, site marked, risks and benefits discussed, surgical consent, monitors and equipment checked, pre-op evaluation and timeout performed Spinal Block Patient position: sitting Prep: ChloraPrep Patient monitoring: heart rate, continuous pulse ox, blood pressure and cardiac monitor Approach: midline Location: L3-4 Injection technique: single-shot Needle Needle type: Whitacre and Introducer  Needle gauge: 25 G Needle length: 9 cm Assessment Sensory level: T10 Events: CSF return Additional Notes Sterile aseptic technique used throughout the procedure.  Negative paresthesia. Negative blood return. Positive free-flowing CSF. Expiration date of kit checked and confirmed. Patient tolerated procedure well, without complications.

## 2023-05-13 NOTE — Interval H&P Note (Signed)
History and Physical Interval Note:  05/13/2023 6:08 AM  Kristen Lloyd  has presented today for surgery, with the diagnosis of PRIMARY OSTEOARTHRITIS OF LEFT HIP.Marland Kitchen  The various methods of treatment have been discussed with the patient and family. After consideration of risks, benefits and other options for treatment, the patient has consented to  Procedure(s): TOTAL HIP ARTHROPLASTY (Left) as a surgical intervention.  The patient's history has been reviewed, patient examined, no change in status, stable for surgery.  I have reviewed the patient's chart and labs.  Questions were answered to the patient's satisfaction.     Vonn Sliger P Lorella Gomez

## 2023-05-13 NOTE — Evaluation (Signed)
Physical Therapy Evaluation Patient Details Name: Kristen Lloyd MRN: [id number] DOB: 1941/10/28 Today's Date: 05/13/2023  History of Present Illness  Pt is an 82 yo F diagnosed with degenerative arthrosis of the left hip and is s/p elective L THA.  PMH includes: HTN, CAD, osteopenia, and CVA.   Clinical Impression  Pt was pleasant and motivated to participate during the session and put forth good effort throughout. Pt required cuing for proper sequencing during transfers from various height surfaces and during gait to ensure compliance with posterior hip precautions but required no physical assistance during the session.  Pt was able come to standing and to amb 2 x 12 feet with no overt LOB and with no adverse symptoms.  Pt demonstrated fair carryover of proper sequencing with transfers and gait but will require reinforcement during subsequent sessions.  Pt will benefit from continued PT services upon discharge to safely address deficits listed in patient problem list for decreased caregiver assistance and eventual return to PLOF.         Recommendations for follow up therapy are one component of a multi-disciplinary discharge planning process, led by the attending physician.  Recommendations may be updated based on patient status, additional functional criteria and insurance authorization.  Follow Up Recommendations       Assistance Recommended at Discharge Intermittent Supervision/Assistance  Patient can return home with the following  A little help with walking and/or transfers;A little help with bathing/dressing/bathroom;Assistance with cooking/housework;Direct supervision/assist for medications management;Assist for transportation;Help with stairs or ramp for entrance    Equipment Recommendations None recommended by PT  Recommendations for Other Services       Functional Status Assessment Patient has had a recent decline in their functional status and demonstrates the ability to  make significant improvements in function in a reasonable and predictable amount of time.     Precautions / Restrictions Precautions Precautions: Posterior Hip;Fall Precaution Booklet Issued: Yes (comment) Restrictions Weight Bearing Restrictions: Yes LLE Weight Bearing: Weight bearing as tolerated      Mobility  Bed Mobility               General bed mobility comments: NT, pt found and left in recliner    Transfers Overall transfer level: Needs assistance Equipment used: Rolling walker (2 wheels) Transfers: Sit to/from Stand Sit to Stand: Min guard           General transfer comment: Mod verbal cues for proper sequencing for posterior hip precaution compliance    Ambulation/Gait Ambulation/Gait assistance: Min guard Gait Distance (Feet): 12 Feet  x 2 Assistive device: Rolling walker (2 wheels) Gait Pattern/deviations: Step-to pattern, Decreased step length - right, Decreased stance time - left, Antalgic Gait velocity: decreased     General Gait Details: Min to mod multi-modal cues for proper sequencing with the RW most notably for step-to pattern and during 90 deg L turns to prevent CKD L hip IR  Stairs            Wheelchair Mobility    Modified Rankin (Stroke Patients Only)       Balance Overall balance assessment: Needs assistance Sitting-balance support: Single extremity supported, Feet unsupported Sitting balance-Leahy Scale: Good     Standing balance support: Bilateral upper extremity supported, During functional activity, Reliant on assistive device for balance Standing balance-Leahy Scale: Fair  Pertinent Vitals/Pain Pain Assessment Pain Assessment: 0-10 Pain Score: 6  Pain Location: L hip Pain Descriptors / Indicators: Burning Pain Intervention(s): Repositioned, Premedicated before session, Ice applied, Monitored during session    Home Living Family/patient expects to be discharged to::  Private residence Living Arrangements: Spouse/significant other Available Help at Discharge: Family;Available 24 hours/day Type of Home: House Home Access: Stairs to enter Entrance Stairs-Rails: Right;Left;Can reach both Entrance Stairs-Number of Steps: 3   Home Layout: One level Home Equipment: Agricultural consultant (2 wheels);BSC/3in1;Cane - single point      Prior Function Prior Level of Function : Independent/Modified Independent             Mobility Comments: Mod Ind amb community distances with a SPC, no fall history ADLs Comments: Ind with ADLs     Hand Dominance        Extremity/Trunk Assessment   Upper Extremity Assessment Upper Extremity Assessment: Defer to OT evaluation    Lower Extremity Assessment Lower Extremity Assessment: Generalized weakness;LLE deficits/detail LLE Deficits / Details: BLE ankle strength, AROM, and sensation to light touch grossly WNL LLE: Unable to fully assess due to pain LLE Sensation: WNL       Communication   Communication: No difficulties  Cognition Arousal/Alertness: Awake/alert Behavior During Therapy: WFL for tasks assessed/performed Overall Cognitive Status: Within Functional Limits for tasks assessed                                          General Comments      Exercises Other Exercises: 90 deg L turn training to prevent CKD L hip IR during ambulation  Other Exercises: HEP education per handout Other Exercises: Posterior hip precaution education verbally and during functional tasks   Assessment/Plan    PT Assessment Patient needs continued PT services  PT Problem List Decreased strength;Decreased activity tolerance;Decreased balance;Decreased mobility;Decreased knowledge of use of DME;Decreased safety awareness;Decreased knowledge of precautions;Pain       PT Treatment Interventions DME instruction;Gait training;Stair training;Functional mobility training;Therapeutic activities;Therapeutic  exercise;Balance training;Patient/family education    PT Goals (Current goals can be found in the Care Plan section)  Acute Rehab PT Goals Patient Stated Goal: To walk without pain PT Goal Formulation: With patient Time For Goal Achievement: 05/26/23 Potential to Achieve Goals: Good    Frequency BID     Co-evaluation               AM-PAC PT "6 Clicks" Mobility  Outcome Measure Help needed turning from your back to your side while in a flat bed without using bedrails?: A Little Help needed moving from lying on your back to sitting on the side of a flat bed without using bedrails?: A Little Help needed moving to and from a bed to a chair (including a wheelchair)?: A Little Help needed standing up from a chair using your arms (e.g., wheelchair or bedside chair)?: A Little Help needed to walk in hospital room?: A Little Help needed climbing 3-5 steps with a railing? : A Lot 6 Click Score: 17    End of Session Equipment Utilized During Treatment: Gait belt Activity Tolerance: Patient tolerated treatment well Patient left: in chair;with call bell/phone within reach;with chair alarm set;with SCD's reapplied;with family/visitor present;Other (comment) (Ice bag to L hip as found) Nurse Communication: Mobility status;Weight bearing status PT Visit Diagnosis: Other abnormalities of gait and mobility (R26.89);Muscle weakness (generalized) (M62.81);Pain Pain -  Right/Left: Left Pain - part of body: Hip    Time: 1610-9604 PT Time Calculation (min) (ACUTE ONLY): 49 min   Charges:   PT Evaluation $PT Eval Moderate Complexity: 1 Mod PT Treatments $Gait Training: 8-22 mins $Therapeutic Activity: 8-22 mins       D. Elly Modena PT, DPT 05/13/23, 3:58 PM

## 2023-05-14 DIAGNOSIS — M1612 Unilateral primary osteoarthritis, left hip: Secondary | ICD-10-CM | POA: Diagnosis not present

## 2023-05-14 MED ORDER — ENOXAPARIN SODIUM 40 MG/0.4ML IJ SOSY: 40.0000 mg | PREFILLED_SYRINGE | INTRAMUSCULAR | 0 refills | Status: DC

## 2023-05-14 MED ORDER — TRAMADOL HCL 50 MG PO TABS: 50.0000 mg | ORAL_TABLET | ORAL | 0 refills | Status: DC | PRN

## 2023-05-14 MED ORDER — ASPIRIN 81 MG PO TBEC: 81.0000 mg | DELAYED_RELEASE_TABLET | Freq: Two times a day (BID) | ORAL | Status: AC

## 2023-05-14 MED ORDER — OXYCODONE HCL 5 MG PO TABS: 5.0000 mg | ORAL_TABLET | ORAL | 0 refills | Status: DC | PRN

## 2023-05-14 MED ORDER — CELECOXIB 200 MG PO CAPS: 200.0000 mg | ORAL_CAPSULE | Freq: Two times a day (BID) | ORAL | 1 refills | Status: DC

## 2023-05-14 NOTE — Discharge Summary (Addendum)
Physician Discharge Summary  Subjective: 1 Day Post-Op Procedure(s) (LRB): TOTAL HIP ARTHROPLASTY (Left) Patient reports pain as mild.   Patient seen in rounds with Dr. Ernest Pine. Patient is well, and has had no acute complaints or problems Patient is ready to go home with home health physical therapy.  Physician Discharge Summary  Patient ID: Kristen Lloyd MRN: [id number] DOB/AGE: 1941-01-28 82 y.o.  Admit date: 05/13/2023 Discharge date: 05/14/2023  Admission Diagnoses:  Discharge Diagnoses:  Principal Problem:   Hx of total hip arthroplasty, left   Discharged Condition: fair  Hospital Course: The patient is postop day 1 from a left total hip arthroplasty.  She is doing well since surgery.  Her pain is manageable.  She has been ambulating with physical therapy.  The patient had her Hemovac drain removed this morning.  Her vitals have remained stable.  The patient is ready to go home with home health physical therapy after completing physical therapy goals.  Treatments: surgery:    Left total hip arthroplasty   SURGEON:  Jena Gauss. M.D.   ASSISTANT:  Gean Birchwood, PA-C (present and scrubbed throughout the case, critical for assistance with exposure, retraction, instrumentation, and closure)   ANESTHESIA: spinal   ESTIMATED BLOOD LOSS: 100 mL   FLUIDS REPLACED: 1200 mL of crystalloid   DRAINS: 2 medium Hemovac drains   IMPLANTS UTILIZED: DePuy size 3 high offset Actis femoral stem, 52 mm OD Pinnacle 100 acetabular component, +4 mm 10 degree Pinnacle Marathon polyethylene insert, and a 36 mm M-SPEC +1.5 mm hip ball  Discharge Exam: Blood pressure 108/62, pulse 74, temperature 98.1 F (36.7 C), temperature source Oral, resp. rate 18, height 5\' 1"  (1.549 m), weight 61.7 kg, SpO2 96 %.   Disposition: Discharge disposition: 01-Home or Self Care        Allergies as of 05/14/2023       Reactions   Flagyl [metronidazole]    Levofloxacin    Moxifloxacin     Septra [sulfamethoxazole-trimethoprim] Hives        Medication List     STOP taking these medications    aspirin 81 MG tablet Replaced by: aspirin EC 81 MG tablet   ibuprofen 200 MG tablet Commonly known as: ADVIL       TAKE these medications    acetaminophen 500 MG tablet Commonly known as: TYLENOL Take 500 mg by mouth as needed for moderate pain.   aspirin EC 81 MG tablet Take 1 tablet (81 mg total) by mouth 2 (two) times daily. Swallow whole. Replaces: aspirin 81 MG tablet   celecoxib 200 MG capsule Commonly known as: CELEBREX Take 1 capsule (200 mg total) by mouth 2 (two) times daily.   cholecalciferol 1000 units tablet Commonly known as: VITAMIN D Take 1,000 Units by mouth daily.   docusate sodium 50 MG capsule Commonly known as: COLACE Take 50 mg by mouth daily.   famotidine 20 MG tablet Commonly known as: PEPCID Take 20 mg by mouth as needed for heartburn or indigestion.   fenofibrate 160 MG tablet Take 80 mg by mouth daily.   fluticasone 50 MCG/ACT nasal spray Commonly known as: FLONASE Place 1 spray into both nostrils 2 (two) times daily.   Mylanta 200-200-20 MG/5ML suspension Generic drug: alum & mag hydroxide-simeth Take 30 mLs by mouth every 6 (six) hours as needed for indigestion or heartburn.   omeprazole 40 MG capsule Commonly known as: PRILOSEC Take 40 mg by mouth daily.   oxyCODONE 5 MG immediate release  tablet Commonly known as: Oxy IR/ROXICODONE Take 1 tablet (5 mg total) by mouth every 4 (four) hours as needed for moderate pain (pain score 4-6).   sodium chloride 0.65 % Soln nasal spray Commonly known as: OCEAN Place 2 sprays into both nostrils every 2 (two) hours while awake. What changed:  when to take this reasons to take this   traMADol 50 MG tablet Commonly known as: ULTRAM Take 1 tablet (50 mg total) by mouth every 4 (four) hours as needed for moderate pain. What changed:  how much to take when to take this    Voltaren Arthritis Pain 1 % Gel Generic drug: diclofenac Sodium Apply 1 Application topically 2 (two) times daily.               Durable Medical Equipment  (From admission, onward)           Start     Ordered   05/13/23 1208  DME Walker rolling  Once       Question:  Patient needs a walker to treat with the following condition  Answer:  S/P total hip arthroplasty   05/13/23 1207   05/13/23 1208  DME Bedside commode  Once       Comments: Patient is not able to walk the distance required to go the bathroom, or he/she is unable to safely negotiate stairs required to access the bathroom.  A 3in1 BSC will alleviate this problem  Question:  Patient needs a bedside commode to treat with the following condition  Answer:  S/P total hip arthroplasty   05/13/23 1207            Follow-up Information     Alaycia Eardley, Illene Labrador, MD Follow up on 06/28/2023.   Specialty: Orthopedic Surgery Why: at 2:45pm Contact information: [address] [facility] [place] [address] [phone]                 Signed: Donato Heinz 05/14/2023, 10:01 AM   Objective: Vital signs in last 24 hours: Temp:  [97.5 F (36.4 C)-98.5 F (36.9 C)] 98.1 F (36.7 C) (05/18 0900) Pulse Rate:  [74-104] 74 (05/18 0331) Resp:  [15-30] 18 (05/18 0900) BP: (89-128)/(56-81) 108/62 (05/18 0900) SpO2:  [94 %-100 %] 96 % (05/18 0900)  Intake/Output from previous day:  Intake/Output Summary (Last 24 hours) at 05/14/2023 1001 Last data filed at 05/14/2023 0500 Gross per 24 hour  Intake 2253.05 ml  Output 1380 ml  Net 873.05 ml    Intake/Output this shift: No intake/output data recorded.  Labs: No results for input(s): "HGB" in the last 72 hours. No results for input(s): "WBC", "RBC", "HCT", "PLT" in the last 72 hours. No results for input(s): "NA", "K", "CL", "CO2", "BUN", "CREATININE", "GLUCOSE", "CALCIUM" in the last 72 hours. No results for input(s): "LABPT", "INR" in the  last 72 hours.  EXAM: General - Patient is Alert and Oriented Extremity - Neurovascular intact Sensation intact distally Dorsiflexion/Plantar flexion intact Compartment soft Incision - clean, dry, with the Hemovac tubing removed with no complication Motor Function -plantarflexion and dorsiflexion are intact.  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   GERD (gastroesophageal reflux disease)    HTN (hypertension)    Hyperlipemia    Osteopenia    Palpitations    Primary osteoarthritis of left hip    Shingles    Spastic colon    Principal Problem:   Hx of total hip arthroplasty, left  Estimated body mass index is 25.7 kg/m as calculated from the following:   Height as of this encounter: 5\' 1"  (1.549 m).   Weight as of this encounter: 61.7 kg. Advance diet Up with therapy D/C IV fluids Discharge home with home health Diet - Regular diet Follow up - in 6 weeks Activity - WBAT Disposition - Home Condition Upon Discharge - Stable DVT Prophylaxis - Lovenox and TED hose  Dedra Skeens, PA-C Orthopaedic Surgery 05/14/2023, 10:01 AM

## 2023-05-14 NOTE — Plan of Care (Signed)
  Problem: Education: Goal: Knowledge of General Education information will improve Description: Including pain rating scale, medication(s)/side effects and non-pharmacologic comfort measures Outcome: Progressing   Problem: Health Behavior/Discharge Planning: Goal: Ability to manage health-related needs will improve Outcome: Progressing   Problem: Clinical Measurements: Goal: Ability to maintain clinical measurements within normal limits will improve Outcome: Progressing Goal: Will remain free from infection Outcome: Progressing Goal: Diagnostic test results will improve Outcome: Progressing   Problem: Activity: Goal: Risk for activity intolerance will decrease Outcome: Progressing   Problem: Nutrition: Goal: Adequate nutrition will be maintained Outcome: Progressing   Problem: Elimination: Goal: Will not experience complications related to bowel motility Outcome: Progressing Goal: Will not experience complications related to urinary retention Outcome: Progressing   Problem: Pain Managment: Goal: General experience of comfort will improve Outcome: Progressing   Problem: Safety: Goal: Ability to remain free from injury will improve Outcome: Progressing   Problem: Skin Integrity: Goal: Risk for impaired skin integrity will decrease Outcome: Progressing   

## 2023-05-14 NOTE — Progress Notes (Signed)
Physical Therapy Treatment Patient Details Name: Kristen Lloyd MRN: [id number] DOB: Mar 30, 1941 Today's Date: 05/14/2023   History of Present Illness Pt is an 82 yo F diagnosed with degenerative arthrosis of the left hip and is s/p elective L THA.  PMH includes: HTN, CAD, osteopenia, and CVA.    PT Comments    Pt is progressing with increased independence with functional movement preforming transfers, bed mobility, gait with RW, and stair negotiation at supervision level.  Pt continues to require vc to review body mechanics to maintain posterior hip precautions during functional activities.  Continued PT would help pt gain more strength and independence during functional mobility.    Recommendations for follow up therapy are one component of a multi-disciplinary discharge planning process, led by the attending physician.  Recommendations may be updated based on patient status, additional functional criteria and insurance authorization.  Follow Up Recommendations       Assistance Recommended at Discharge Intermittent Supervision/Assistance  Patient can return home with the following A little help with walking and/or transfers;A little help with bathing/dressing/bathroom;Assistance with cooking/housework;Direct supervision/assist for medications management;Assist for transportation;Help with stairs or ramp for entrance   Equipment Recommendations  None recommended by PT    Recommendations for Other Services       Precautions / Restrictions Precautions Precautions: Posterior Hip;Fall Precaution Booklet Issued: Yes (comment) Restrictions Weight Bearing Restrictions: Yes LLE Weight Bearing: Weight bearing as tolerated     Mobility  Bed Mobility Overal bed mobility: Needs Assistance Bed Mobility: Sidelying to Sit   Sidelying to sit: Supervision       General bed mobility comments: performed with minimal use of handrail, HOB flat, reviewed body mechanics to maintain posterior hip  precautions.    Transfers Overall transfer level: Needs assistance Equipment used: Rolling walker (2 wheels) Transfers: Sit to/from Stand, Bed to chair/wheelchair/BSC Sit to Stand: Supervision   Step pivot transfers: Supervision       General transfer comment: VC for hip precautions sequencing with walker, cues for safe hand placement    Ambulation/Gait Ambulation/Gait assistance: Supervision Gait Distance (Feet): 20 Feet (15 x4) Assistive device: Rolling walker (2 wheels) Gait Pattern/deviations: Step-to pattern, Decreased step length - right, Decreased stance time - left, Antalgic, Step-through pattern Gait velocity: decreased     General Gait Details: progressed during session from step to to step through pattern. Cues to roll walker vs. lifting it each step and encouraged greater steplength. reviewed body mechanics to maintain posterior hip precautions esp. with turning.   Stairs Stairs: Yes Stairs assistance: Supervision Stair Management: Two rails, Step to pattern Number of Stairs: 4 General stair comments: Reviewed body mechanics to maintain posterior hip precautions.  Also, practised stepping up one step with walker.   Wheelchair Mobility    Modified Rankin (Stroke Patients Only)       Balance Overall balance assessment: Modified Independent Sitting-balance support: Feet unsupported, No upper extremity supported Sitting balance-Leahy Scale: Good     Standing balance support: During functional activity, Reliant on assistive device for balance, Single extremity supported Standing balance-Leahy Scale: Good Standing balance comment: performed functional hygiene in BR without holding walker for limited time without LOB.                            Cognition Arousal/Alertness: Awake/alert Behavior During Therapy: WFL for tasks assessed/performed Overall Cognitive Status: Within Functional Limits for tasks assessed  Exercises      General Comments        Pertinent Vitals/Pain Pain Assessment Pain Assessment: 0-10 Pain Score: 6  Pain Location: L hip Pain Descriptors / Indicators: Aching, Sore Pain Intervention(s): Monitored during session    Home Living                          Prior Function            PT Goals (current goals can now be found in the care plan section) Acute Rehab PT Goals Patient Stated Goal: To walk without pain PT Goal Formulation: With patient Time For Goal Achievement: 05/26/23 Potential to Achieve Goals: Good Progress towards PT goals: Progressing toward goals    Frequency    BID      PT Plan Current plan remains appropriate    Co-evaluation              AM-PAC PT "6 Clicks" Mobility   Outcome Measure  Help needed turning from your back to your side while in a flat bed without using bedrails?: A Little Help needed moving from lying on your back to sitting on the side of a flat bed without using bedrails?: A Little Help needed moving to and from a bed to a chair (including a wheelchair)?: A Little Help needed standing up from a chair using your arms (e.g., wheelchair or bedside chair)?: A Little Help needed to walk in hospital room?: A Little Help needed climbing 3-5 steps with a railing? : A Little 6 Click Score: 18    End of Session Equipment Utilized During Treatment: Gait belt Activity Tolerance: Patient tolerated treatment well Patient left: in chair;with call bell/phone within reach;with chair alarm set Nurse Communication: Mobility status;Weight bearing status PT Visit Diagnosis: Other abnormalities of gait and mobility (R26.89);Muscle weakness (generalized) (M62.81);Pain Pain - part of body: Hip     Time: 1610-9604 PT Time Calculation (min) (ACUTE ONLY): 43 min  Charges:  $Gait Training: 8-22 mins $Therapeutic Activity: 23-37 mins                     Hortencia Conradi, PTA  05/14/23, 9:53 AM

## 2023-05-14 NOTE — TOC Transition Note (Signed)
Transition of Care [facility]) - CM/SW Discharge Note   Patient Details  Name: Kristen Lloyd MRN: [id number] Date of Birth: [birth year]  [facility] of [facility]) CM/SW Contact:  Luvenia Redden, RN Phone Number:[phone] 05/14/2023, 11:18 AM   Clinical Narrative:    TOC RN notified [facility]) of pt's discharge status today. No other needs presented at this time.        Patient Goals and CMS Choice      Discharge Placement                         Discharge Plan and Services Additional resources added to the After Visit Summary for                                       Social Determinants of Health (SDOH) Interventions SDOH Screenings   Food Insecurity: No Food Insecurity (05/13/2023)  Housing: Low Risk  (05/13/2023)  Transportation Needs: No Transportation Needs (05/13/2023)  Utilities: Not At Risk (05/13/2023)  Tobacco Use: Low Risk  (05/13/2023)     Readmission Risk Interventions     No data to display

## 2023-05-14 NOTE — Anesthesia Postprocedure Evaluation (Signed)
Anesthesia Post Note  Patient: Kristen Lloyd  Procedure(s) Performed: TOTAL HIP ARTHROPLASTY (Left: Hip)  Patient location during evaluation: Nursing Unit Anesthesia Type: Spinal Level of consciousness: awake and alert Pain management: pain level controlled Vital Signs Assessment: post-procedure vital signs reviewed and stable Respiratory status: spontaneous breathing, nonlabored ventilation, respiratory function stable and patient connected to nasal cannula oxygen Cardiovascular status: blood pressure returned to baseline and stable Postop Assessment: no apparent nausea or vomiting Anesthetic complications: no  No notable events documented.   Last Vitals:  Vitals:   05/14/23 0331 05/14/23 0900  BP: 115/66 108/62  Pulse: 74   Resp: 19 18  Temp: 36.7 C 36.7 C  SpO2: 95% 96%    Last Pain:  Vitals:   05/14/23 0900  TempSrc: Oral  PainSc: 6                  Stephanie Coup

## 2023-05-14 NOTE — Progress Notes (Signed)
  Subjective: 1 Day Post-Op Procedure(s) (LRB): TOTAL HIP ARTHROPLASTY (Left) Patient reports pain as mild.   Patient is well, and has had no acute complaints or problems Plan is to go Home after hospital stay. Negative for chest pain and shortness of breath Fever: no Gastrointestinal: Negative for nausea and vomiting  Objective: Vital signs in last 24 hours: Temp:  [97.5 F (36.4 C)-98.5 F (36.9 C)] 98 F (36.7 C) (05/18 0331) Pulse Rate:  [74-104] 74 (05/18 0331) Resp:  [15-30] 19 (05/18 0331) BP: (89-128)/(56-81) 115/66 (05/18 0331) SpO2:  [94 %-100 %] 95 % (05/18 0331)  Intake/Output from previous day:  Intake/Output Summary (Last 24 hours) at 05/14/2023 0701 Last data filed at 05/14/2023 0500 Gross per 24 hour  Intake 2253.05 ml  Output 1380 ml  Net 873.05 ml    Intake/Output this shift: No intake/output data recorded.  Labs: No results for input(s): "HGB" in the last 72 hours. No results for input(s): "WBC", "RBC", "HCT", "PLT" in the last 72 hours. No results for input(s): "NA", "K", "CL", "CO2", "BUN", "CREATININE", "GLUCOSE", "CALCIUM" in the last 72 hours. No results for input(s): "LABPT", "INR" in the last 72 hours.   EXAM General - Patient is Alert and Oriented Extremity - Neurovascular intact Sensation intact distally Dorsiflexion/Plantar flexion intact Compartment soft Dressing/Incision - clean, dry, with a Hemovac removed with no complication.  The Hemovac tubing was intact on removal. Motor Function - intact, moving foot and toes well on exam.   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    GERD (gastroesophageal reflux disease)    HTN (hypertension)    Hyperlipemia    Osteopenia    Palpitations    Primary osteoarthritis of left hip    Shingles    Spastic colon     Assessment/Plan: 1 Day Post-Op Procedure(s) (LRB): TOTAL HIP ARTHROPLASTY (Left) Principal Problem:   Hx of total hip arthroplasty, left  Estimated body mass index is 25.7 kg/m as calculated from the following:   Height as of this encounter: 5\' 1"  (1.549 m).   Weight as of this encounter: 61.7 kg. Advance diet Up with therapy D/C IV fluids Discharge home with home health  DVT Prophylaxis - Lovenox, Foot Pumps, and TED hose Weight-Bearing as tolerated to left leg  Dedra Skeens, PA-C Orthopaedic Surgery 05/14/2023, 7:01 AM

## 2023-05-17 ENCOUNTER — Encounter: Payer: Self-pay | Admitting: Orthopedic Surgery

## 2023-05-17 LAB — SURGICAL PATHOLOGY

## 2023-08-03 ENCOUNTER — Inpatient Hospital Stay: Payer: Medicare Other

## 2023-08-03 ENCOUNTER — Inpatient Hospital Stay: Payer: Medicare Other | Attending: Oncology | Admitting: Oncology

## 2023-08-03 ENCOUNTER — Encounter: Payer: Self-pay | Admitting: Oncology

## 2023-08-03 VITALS — BP 134/69 | HR 72 | Temp 97.0°F | Resp 18 | Ht 61.0 in | Wt 135.5 lb

## 2023-08-03 DIAGNOSIS — D508 Other iron deficiency anemias: Secondary | ICD-10-CM | POA: Insufficient documentation

## 2023-08-03 DIAGNOSIS — Z79899 Other long term (current) drug therapy: Secondary | ICD-10-CM | POA: Insufficient documentation

## 2023-08-03 DIAGNOSIS — Z803 Family history of malignant neoplasm of breast: Secondary | ICD-10-CM | POA: Insufficient documentation

## 2023-08-04 ENCOUNTER — Encounter: Payer: Self-pay | Admitting: Oncology

## 2023-08-04 DIAGNOSIS — D509 Iron deficiency anemia, unspecified: Secondary | ICD-10-CM | POA: Insufficient documentation

## 2023-08-04 NOTE — Progress Notes (Signed)
Hematology/Oncology Consult note [facility] [phone] [phone]  Patient Care Team: Mick Sell, MD as PCP - General (Infectious Diseases) Creig Hines, MD as Consulting Physician (Oncology)   Name of the patient: Kristen Lloyd  [id number]  [birth year]    Reason for referral-iron deficiency anemia   Referring physician-Dr. Sampson Goon  Date of visit: 08/04/23   History of presenting illness-patient is a 82 year old female with a past medical history of iron deficiency anemia.  She has undergone colon surgery for diverticulitis in the past when she was found to be anemic.  She takes chronic stool softeners.  Most recently she underwent hip surgery as well.  Her hemoglobin dropped following her hip surgery when she was noted to have an H&H of 9.9/30.9, baseline 12.  Ferritin levels were low at 13.  B12 levels were low at 77 and patient is getting B12 injections through her primary care doctor.  She denies any blood loss in her stool or urine.  She has a history of tortuous colon and unable to undergo colonoscopies and usually undergoes CT colonography for colon cancer screening.  ECOG PS- 1  Pain scale- 0   Review of systems- Review of Systems  Constitutional:  Positive for malaise/fatigue. Negative for chills, fever and weight loss.  HENT:  Negative for congestion, ear discharge and nosebleeds.   Eyes:  Negative for blurred vision.  Respiratory:  Negative for cough, hemoptysis, sputum production, shortness of breath and wheezing.   Cardiovascular:  Negative for chest pain, palpitations, orthopnea and claudication.  Gastrointestinal:  Negative for abdominal pain, blood in stool, constipation, diarrhea, heartburn, melena, nausea and vomiting.  Genitourinary:  Negative for dysuria, flank pain, frequency, hematuria and urgency.  Musculoskeletal:  Negative for back pain, joint pain and myalgias.  Skin:  Negative for rash.   Neurological:  Negative for dizziness, tingling, focal weakness, seizures, weakness and headaches.  Endo/Heme/Allergies:  Does not bruise/bleed easily.  Psychiatric/Behavioral:  Negative for depression and suicidal ideas. The patient does not have insomnia.     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   Diverticulitis of large intestine with abscess without bleeding 12/25/2019   Unstable angina (HCC) 01/19/2017   CAD (coronary artery disease) 01/19/2017   Globus sensation 03/08/2016   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   HTN (hypertension) 06/03/2014   Hyperlipidemia 06/03/2014   Palpitation 06/03/2014   TIA (transient ischemic attack) 06/03/2014   Presence of coronary angioplasty implant and graft 12/27/2009     Past Medical History:  Diagnosis Date   Angina pectoris (HCC)    Aortic atherosclerosis (HCC)    Arthritis    Atrophic vaginitis    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   GERD (gastroesophageal reflux disease)  HTN (hypertension)    Hyperlipemia    Osteopenia    Palpitations    Primary osteoarthritis of left hip    Shingles    Spastic colon      Past Surgical History:  Procedure Laterality Date   ABDOMINAL  HYSTERECTOMY     APPENDECTOMY     BREAST BIOPSY Left 1990's   lt bx x 2-neg   CARDIAC CATHETERIZATION N/A 01/19/2017   Procedure: Left Heart Cath and Coronary Angiography;  Surgeon: Dalia Heading, MD;  Location: [facility] INVASIVE CV LAB;  Service: Cardiovascular;  Laterality: N/A;   CARDIAC CATHETERIZATION N/A 01/19/2017   Procedure: Coronary Stent Intervention;  Surgeon: Marcina Millard, MD;  Location: [facility] INVASIVE CV LAB;  Service: Cardiovascular;  Laterality: N/A;   Ileostomy and reversal N/A 2021   TONSILLECTOMY     TOTAL HIP ARTHROPLASTY Left 05/13/2023   Procedure: TOTAL HIP ARTHROPLASTY;  Surgeon: Donato Heinz, MD;  Location: [facility] ORS;  Service: Orthopedics;  Laterality: Left;    Social History   Socioeconomic History   Marital status: Married    Spouse name: Not on file   Number of children: Not on file   Years of education: Not on file   Highest education level: Not on file  Occupational History   Not on file  Tobacco Use   Smoking status: Never   Smokeless tobacco: Never  Vaping Use   Vaping status: Never Used  Substance and Sexual Activity   Alcohol use: Yes    Comment: 1 glass of martini   Drug use: No   Sexual activity: Not on file  Other Topics Concern   Not on file  Social History Narrative   Married and lives at home with husband.    Social Determinants of Health   Financial Resource Strain: Low Risk  (07/27/2023)   Received from [facility]   Overall Financial Resource Strain (CARDIA)    Difficulty of Paying Living Expenses: Not hard at all  Food Insecurity: No Food Insecurity (08/03/2023)   Hunger Vital Sign    Worried About Running Out of Food in the Last Year: Never true    Ran Out of Food in the Last Year: Never true  Transportation Needs: No Transportation Needs (08/03/2023)   PRAPARE - Administrator, Civil Service (Medical): No    Lack of Transportation (Non-Medical): No  Physical Activity: Not on file  Stress:  Not on file  Social Connections: Not on file  Intimate Partner Violence: Not At Risk (08/03/2023)   Humiliation, Afraid, Rape, and Kick questionnaire    Fear of Current or Ex-Partner: No    Emotionally Abused: No    Physically Abused: No    Sexually Abused: No     Family History  Problem Relation Age of Onset   Breast cancer Cousin 9       mat cousin     Current Outpatient Medications:    aspirin EC 81 MG tablet, Take 1 tablet (81 mg total) by mouth 2 (two) times daily. Swallow whole., Disp: , Rfl:    celecoxib (CELEBREX) 200 MG capsule, Take 1 capsule (200 mg total) by mouth 2 (two) times daily., Disp: 60 capsule, Rfl: 1   cholecalciferol (VITAMIN D) 1000 UNITS tablet, Take 1,000 Units by mouth daily., Disp: , Rfl:    diclofenac Sodium (VOLTAREN ARTHRITIS PAIN) 1 % GEL, Apply 1 Application topically 2 (two) times daily., Disp: , Rfl:    docusate sodium (COLACE) 50 MG  capsule, Take 50 mg by mouth daily., Disp: , Rfl:    fenofibrate 160 MG tablet, Take 80 mg by mouth daily., Disp: , Rfl:    fluticasone (FLONASE) 50 MCG/ACT nasal spray, Place 1 spray into both nostrils 2 (two) times daily., Disp: 16 g, Rfl: 0   omeprazole (PRILOSEC) 40 MG capsule, Take 40 mg by mouth daily., Disp: , Rfl:    traMADol (ULTRAM) 50 MG tablet, Take 1 tablet (50 mg total) by mouth every 4 (four) hours as needed for moderate pain., Disp: 30 tablet, Rfl: 0   acetaminophen (TYLENOL) 500 MG tablet, Take 500 mg by mouth as needed for moderate pain. (Patient not taking: Reported on 08/03/2023), Disp: , Rfl:    alum & mag hydroxide-simeth (MYLANTA) 200-200-20 MG/5ML suspension, Take 30 mLs by mouth every 6 (six) hours as needed for indigestion or heartburn. (Patient not taking: Reported on 08/03/2023), Disp: , Rfl:    famotidine (PEPCID) 20 MG tablet, Take 20 mg by mouth as needed for heartburn or indigestion. (Patient not taking: Reported on 08/03/2023), Disp: , Rfl:    oxyCODONE (OXY IR/ROXICODONE) 5 MG immediate release  tablet, Take 1 tablet (5 mg total) by mouth every 4 (four) hours as needed for moderate pain (pain score 4-6). (Patient not taking: Reported on 08/03/2023), Disp: 30 tablet, Rfl: 0   sodium chloride (OCEAN) 0.65 % SOLN nasal spray, Place 2 sprays into both nostrils every 2 (two) hours while awake. (Patient not taking: Reported on 08/03/2023), Disp: , Rfl: 0 No current facility-administered medications for this visit.  Facility-Administered Medications Ordered in Other Visits:    0.9 %  sodium chloride infusion, 250 mL, Intravenous, PRN, Lady Gary, Darlin Priestly, MD   [EXPIRED] 0.9% sodium chloride infusion, 3 mL/kg/hr, Intravenous, Continuous **FOLLOWED BY** 0.9% sodium chloride infusion, 1 mL/kg/hr, Intravenous, Continuous, Fath, Darlin Priestly, MD   sodium chloride flush (NS) 0.9 % injection 3 mL, 3 mL, Intravenous, Q12H, Fath, Darlin Priestly, MD   sodium chloride flush (NS) 0.9 % injection 3 mL, 3 mL, Intravenous, PRN, Dalia Heading, MD   Physical exam:  Vitals:   08/03/23 1325  BP: 134/69  Pulse: 72  Resp: 18  Temp: (!) 97 F (36.1 C)  TempSrc: Tympanic  SpO2: 98%  Weight: 135 lb 8 oz (61.5 kg)  Height: 5\' 1"  (1.549 m)   Physical Exam Cardiovascular:     Rate and Rhythm: Normal rate and regular rhythm.     Heart sounds: Normal heart sounds.  Pulmonary:     Effort: Pulmonary effort is normal.     Breath sounds: Normal breath sounds.  Abdominal:     General: Bowel sounds are normal.     Palpations: Abdomen is soft.  Skin:    General: Skin is warm and dry.  Neurological:     Mental Status: She is alert and oriented to person, place, and time.          Latest Ref Rng & Units 05/06/2023    1:30 PM  CMP  Glucose 70 - 99 mg/dL 78   BUN 8 - 23 mg/dL 17   Creatinine 1.61 - 1.00 mg/dL 0.96   Sodium 045 - 409 mmol/L 139   Potassium 3.5 - 5.1 mmol/L 3.9   Chloride 98 - 111 mmol/L 103   CO2 22 - 32 mmol/L 26   Calcium 8.9 - 10.3 mg/dL 9.0   Total Protein 6.5 - 8.1 g/dL 6.6   Total Bilirubin  0.3 - 1.2 mg/dL 0.9  Alkaline Phos 38 - 126 U/L 61   AST 15 - 41 U/L 16   ALT 0 - 44 U/L 11       Latest Ref Rng & Units 05/06/2023    1:30 PM  CBC  WBC 4.0 - 10.5 K/uL 6.4   Hemoglobin 12.0 - 15.0 g/dL 46.9   Hematocrit 62.9 - 46.0 % 39.3   Platelets 150 - 400 K/uL 199     No images are attached to the encounter.  No results found.  Assessment and plan- Patient is a 82 y.o. female referred for iron deficiency anemia    iron deficiency anemia secondary to recent hip surgery.  Baseline hemoglobin runs around 12 and has drifted down to 9.9.  Patient is concerned about symptoms of constipation since she is on chronic stool softeners if she takes oral iron.  Denies any blood loss in her stool or urine.  Labs are suggestive of iron deficiency.  We will proceed with IV iron Monoferric X1 dose. Discussed risks and benefits of IV iron including all but not limited to possible of infusion reactions.  Patient understands and agrees to proceed as planned.  She will continue to receive B12 injections through her primary care doctor.  She is also getting the labs checked over there in 3 months.  I will see her back in 3 months   Thank you for this kind referral and the opportunity to participate in the care of this  Patient   Visit Diagnosis No diagnosis found.  Dr. Owens Shark, MD, MPH [facility] [phone] 08/04/2023

## 2023-08-08 ENCOUNTER — Inpatient Hospital Stay: Payer: Medicare Other

## 2023-08-08 VITALS — BP 127/70 | HR 63 | Resp 16

## 2023-08-08 DIAGNOSIS — D508 Other iron deficiency anemias: Secondary | ICD-10-CM

## 2023-08-08 MED ORDER — SODIUM CHLORIDE 0.9 % IV SOLN
Freq: Once | INTRAVENOUS | Status: AC
  Filled 2023-08-08: qty 250

## 2023-08-08 MED ORDER — SODIUM CHLORIDE 0.9 % IV SOLN
1000.0000 mg | Freq: Once | INTRAVENOUS | Status: AC
  Administered 2023-08-08: 1000 mg via INTRAVENOUS
  Filled 2023-08-08: qty 1000

## 2023-08-08 NOTE — Patient Instructions (Signed)

## 2023-08-09 ENCOUNTER — Ambulatory Visit: Payer: Medicare Other

## 2023-08-16 ENCOUNTER — Ambulatory Visit: Payer: Medicare Other

## 2023-08-23 ENCOUNTER — Ambulatory Visit: Payer: Medicare Other

## 2023-08-30 ENCOUNTER — Ambulatory Visit: Payer: Medicare Other

## 2023-09-22 ENCOUNTER — Encounter: Payer: Self-pay | Admitting: Oncology

## 2023-09-27 ENCOUNTER — Ambulatory Visit: Payer: Medicare Other

## 2023-09-27 DIAGNOSIS — Z719 Counseling, unspecified: Secondary | ICD-10-CM

## 2023-09-27 DIAGNOSIS — Z23 Encounter for immunization: Secondary | ICD-10-CM

## 2023-09-27 NOTE — Progress Notes (Signed)
In nurse clinic for immunizations. Patient requesting Covid vaccine. Voices no concerns. VIS reviewed and given to patient. Vaccine (Covid Comirnaty 12+) tolerated well; no issues noted. Patient monitored for 15 minutes; no problems. NCIR updated and copies given to patient.   Abagail Kitchens, RN

## 2023-10-03 ENCOUNTER — Ambulatory Visit: Payer: Medicare Other

## 2023-11-08 ENCOUNTER — Encounter: Payer: Self-pay | Admitting: Oncology

## 2023-11-08 ENCOUNTER — Other Ambulatory Visit: Payer: Medicare Other

## 2023-11-08 ENCOUNTER — Inpatient Hospital Stay: Payer: Medicare Other | Attending: Oncology | Admitting: Oncology

## 2023-11-08 ENCOUNTER — Ambulatory Visit: Payer: Medicare Other

## 2023-11-08 VITALS — BP 134/56 | HR 65 | Temp 96.9°F | Resp 18 | Ht 60.0 in | Wt 135.9 lb

## 2023-11-08 DIAGNOSIS — D508 Other iron deficiency anemias: Secondary | ICD-10-CM | POA: Insufficient documentation

## 2023-11-08 DIAGNOSIS — Z79899 Other long term (current) drug therapy: Secondary | ICD-10-CM | POA: Insufficient documentation

## 2023-11-08 NOTE — Progress Notes (Signed)
Hematology/Oncology Consult note [facility]  Telephone:([phone] Fax:([phone]  Patient Care Team: Mick Sell, MD as PCP - General (Infectious Diseases) Creig Hines, MD as Consulting Physician (Oncology)   Name of the patient: Kristen Lloyd  [id number]  1941/08/21   Date of visit: 11/08/23  Diagnosis-iron deficiency anemia  Chief complaint/ Reason for visit-routine follow-up of iron deficiency anemia  Heme/Onc history: patient is a 82 year old female with a past medical history of iron deficiency anemia.  She has undergone colon surgery for diverticulitis in the past when she was found to be anemic.  She takes chronic stool softeners.  Most recently she underwent hip surgery as well.  Her hemoglobin dropped following her hip surgery when she was noted to have an H&H of 9.9/30.9, baseline 12.  Ferritin levels were low at 13.  B12 levels were low at 77 and patient is getting B12 injections through her primary care doctor.  She denies any blood loss in her stool or urine.  She has a history of tortuous colon and unable to undergo colonoscopies and usually undergoes CT colonography for colon cancer screening.    Interval history-patient does report feeling better after receiving IV iron.  Denies any new complaints at this time  ECOG PS- 1 Pain scale- 0   Review of systems- Review of Systems  Constitutional:  Negative for chills, fever, malaise/fatigue and weight loss.  HENT:  Negative for congestion, ear discharge and nosebleeds.   Eyes:  Negative for blurred vision.  Respiratory:  Negative for cough, hemoptysis, sputum production, shortness of breath and wheezing.   Cardiovascular:  Negative for chest pain, palpitations, orthopnea and claudication.  Gastrointestinal:  Negative for abdominal pain, blood in stool, constipation, diarrhea, heartburn, melena, nausea and vomiting.  Genitourinary:  Negative for dysuria, flank pain, frequency,  hematuria and urgency.  Musculoskeletal:  Negative for back pain, joint pain and myalgias.  Skin:  Negative for rash.  Neurological:  Negative for dizziness, tingling, focal weakness, seizures, weakness and headaches.  Endo/Heme/Allergies:  Does not bruise/bleed easily.  Psychiatric/Behavioral:  Negative for depression and suicidal ideas. The patient does not have insomnia.       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   GERD (gastroesophageal reflux disease)    HTN (hypertension)    Hyperlipemia    Osteopenia    Palpitations    Primary osteoarthritis of left hip    Shingles    Spastic colon      Past Surgical History:  Procedure Laterality Date   ABDOMINAL HYSTERECTOMY     APPENDECTOMY     BREAST BIOPSY Left 1990's   lt bx x 2-neg   CARDIAC CATHETERIZATION N/A 01/19/2017   Procedure: Left Heart Cath and Coronary Angiography;  Surgeon: Dalia Heading, MD;  Location: [facility] INVASIVE CV LAB;  Service: Cardiovascular;  Laterality: N/A;   CARDIAC CATHETERIZATION N/A 01/19/2017   Procedure: Coronary Stent  Intervention;  Surgeon: Marcina Millard, MD;  Location: [facility] INVASIVE CV LAB;  Service: Cardiovascular;  Laterality: N/A;   Ileostomy and reversal N/A 2021   TONSILLECTOMY     TOTAL HIP ARTHROPLASTY Left 05/13/2023   Procedure: TOTAL HIP ARTHROPLASTY;  Surgeon: Donato Heinz, MD;  Location: [facility] ORS;  Service: Orthopedics;  Laterality: Left;    Social History    Socioeconomic History   Marital status: Married    Spouse name: Not on file   Number of children: Not on file   Years of education: Not on file   Highest education level: Not on file  Occupational History   Not on file  Tobacco Use   Smoking status: Never   Smokeless tobacco: Never  Vaping Use   Vaping status: Never Used  Substance and Sexual Activity   Alcohol use: Yes    Comment: 1 glass of martini   Drug use: No   Sexual activity: Not on file  Other Topics Concern   Not on file  Social History Narrative   Married and lives at home with husband.    Social Determinants of Health   Financial Resource Strain: Low Risk  (07/27/2023)   Received from [facility]   Overall Financial Resource Strain (CARDIA)    Difficulty of Paying Living Expenses: Not hard at all  Food Insecurity: No Food Insecurity (08/03/2023)   Hunger Vital Sign    Worried About Running Out of Food in the Last Year: Never true    Ran Out of Food in the Last Year: Never true  Transportation Needs: No Transportation Needs (08/03/2023)   PRAPARE - Administrator, Civil Service (Medical): No    Lack of Transportation (Non-Medical): No  Physical Activity: Not on file  Stress: Not on file  Social Connections: Not on file  Intimate Partner Violence: Not At Risk (08/03/2023)   Humiliation, Afraid, Rape, and Kick questionnaire    Fear of Current or Ex-Partner: No    Emotionally Abused: No    Physically Abused: No    Sexually Abused: No    Family History  Problem Relation Age of Onset   Breast cancer Cousin 71       mat cousin     Current Outpatient Medications:    cyanocobalamin (VITAMIN B12) 1000 MCG/ML injection, Inject into the muscle., Disp: , Rfl:    acetaminophen (TYLENOL) 500 MG tablet, Take 500 mg by mouth as needed for moderate pain. (Patient not taking: Reported on 08/03/2023), Disp: , Rfl:    alum & mag hydroxide-simeth (MYLANTA) 200-200-20 MG/5ML suspension, Take 30  mLs by mouth every 6 (six) hours as needed for indigestion or heartburn. (Patient not taking: Reported on 08/03/2023), Disp: , Rfl:    aspirin EC 81 MG tablet, Take 1 tablet (81 mg total) by mouth 2 (two) times daily. Swallow whole., Disp: , Rfl:    celecoxib (CELEBREX) 200 MG capsule, Take 1 capsule (200 mg total) by mouth 2 (two) times daily. (Patient not taking: Reported on 09/27/2023), Disp: 60 capsule, Rfl: 1   cholecalciferol (VITAMIN D) 1000 UNITS tablet, Take 1,000 Units by mouth daily., Disp: , Rfl:    diclofenac Sodium (VOLTAREN ARTHRITIS PAIN) 1 % GEL, Apply 1 Application topically 2 (two) times daily., Disp: , Rfl:    docusate sodium (COLACE) 50 MG capsule, Take 50 mg by mouth daily., Disp: , Rfl:    famotidine (PEPCID) 20 MG tablet, Take 20 mg by mouth as needed for heartburn  or indigestion. (Patient not taking: Reported on 08/03/2023), Disp: , Rfl:    fenofibrate 160 MG tablet, Take 80 mg by mouth daily., Disp: , Rfl:    fluticasone (FLONASE) 50 MCG/ACT nasal spray, Place 1 spray into both nostrils 2 (two) times daily., Disp: 16 g, Rfl: 0   omeprazole (PRILOSEC) 40 MG capsule, Take 40 mg by mouth daily., Disp: , Rfl:    oxyCODONE (OXY IR/ROXICODONE) 5 MG immediate release tablet, Take 1 tablet (5 mg total) by mouth every 4 (four) hours as needed for moderate pain (pain score 4-6). (Patient not taking: Reported on 08/03/2023), Disp: 30 tablet, Rfl: 0   sodium chloride (OCEAN) 0.65 % SOLN nasal spray, Place 2 sprays into both nostrils every 2 (two) hours while awake. (Patient not taking: Reported on 08/03/2023), Disp: , Rfl: 0   traMADol (ULTRAM) 50 MG tablet, Take 1 tablet (50 mg total) by mouth every 4 (four) hours as needed for moderate pain., Disp: 30 tablet, Rfl: 0 No current facility-administered medications for this visit.  Facility-Administered Medications Ordered in Other Visits:    0.9 %  sodium chloride infusion, 250 mL, Intravenous, PRN, Lady Gary, Darlin Priestly, MD   [EXPIRED] 0.9% sodium  chloride infusion, 3 mL/kg/hr, Intravenous, Continuous **FOLLOWED BY** 0.9% sodium chloride infusion, 1 mL/kg/hr, Intravenous, Continuous, Fath, Darlin Priestly, MD   sodium chloride flush (NS) 0.9 % injection 3 mL, 3 mL, Intravenous, Q12H, Fath, Darlin Priestly, MD   sodium chloride flush (NS) 0.9 % injection 3 mL, 3 mL, Intravenous, PRN, Dalia Heading, MD  Physical exam:  Vitals:   11/08/23 1324  BP: (!) 134/56  Pulse: 65  Resp: 18  Temp: (!) 96.9 F (36.1 C)  TempSrc: Tympanic  SpO2: 100%  Weight: 135 lb 14.4 oz (61.6 kg)  Height: 5' (1.524 m)   Physical Exam Cardiovascular:     Rate and Rhythm: Normal rate and regular rhythm.     Heart sounds: Normal heart sounds.  Pulmonary:     Effort: Pulmonary effort is normal.     Breath sounds: Normal breath sounds.  Skin:    General: Skin is warm and dry.  Neurological:     Mental Status: She is alert and oriented to person, place, and time.         Latest Ref Rng & Units 05/06/2023    1:30 PM  CMP  Glucose 70 - 99 mg/dL 78   BUN 8 - 23 mg/dL 17   Creatinine 2.44 - 1.00 mg/dL 0.10   Sodium 272 - 536 mmol/L 139   Potassium 3.5 - 5.1 mmol/L 3.9   Chloride 98 - 111 mmol/L 103   CO2 22 - 32 mmol/L 26   Calcium 8.9 - 10.3 mg/dL 9.0   Total Protein 6.5 - 8.1 g/dL 6.6   Total Bilirubin 0.3 - 1.2 mg/dL 0.9   Alkaline Phos 38 - 126 U/L 61   AST 15 - 41 U/L 16   ALT 0 - 44 U/L 11       Latest Ref Rng & Units 05/06/2023    1:30 PM  CBC  WBC 4.0 - 10.5 K/uL 6.4   Hemoglobin 12.0 - 15.0 g/dL 64.4   Hematocrit 03.4 - 46.0 % 39.3   Platelets 150 - 400 K/uL 199     No images are attached to the encounter.  No results found.   Assessment and plan- Patient is a 82 y.o. female here for routine follow-up of iron deficiency anemia  Patient received Monoferric  in August 2024.  Her hemoglobin improved mildly from 11.2-12.4.  Iron studies were not checked in October 2024.  B12 levels were normal.  I am holding off on any blood work today.CBC  ferritin and iron studies in 3 and 6 months and I will see her back in 6 months.  Patient is presently taking monthly B12 injections and she will discuss with Dr. Sampson Goon if she can switch to B12 tablets.  I will check her B12 levels again in 6 months   Visit Diagnosis 1. Other iron deficiency anemia      Dr. Owens Shark, MD, MPH [facility] at [facility] [phone] 11/08/2023 3:00 PM

## 2023-11-17 ENCOUNTER — Ambulatory Visit: Payer: Medicare Other

## 2023-11-17 DIAGNOSIS — Z23 Encounter for immunization: Secondary | ICD-10-CM

## 2023-11-17 DIAGNOSIS — Z719 Counseling, unspecified: Secondary | ICD-10-CM

## 2023-11-17 NOTE — Progress Notes (Signed)
In nurse clinic for immunizations. Patient requesting RSV vaccine. Voices no concerns. VIS reviewed and given to patient. Vaccine tolerated well; no issues noted. NCIR updated and copy given to patient.   Abagail Kitchens, RN

## 2024-01-11 ENCOUNTER — Other Ambulatory Visit: Payer: Self-pay | Admitting: Infectious Diseases

## 2024-01-11 DIAGNOSIS — Z1231 Encounter for screening mammogram for malignant neoplasm of breast: Secondary | ICD-10-CM

## 2024-01-23 ENCOUNTER — Ambulatory Visit
Admission: RE | Admit: 2024-01-23 | Discharge: 2024-01-23 | Disposition: A | Discharge status: Home / Self Care | Payer: Medicare Other | Source: Ambulatory Visit | Attending: Infectious Diseases | Admitting: Infectious Diseases

## 2024-01-23 DIAGNOSIS — Z1231 Encounter for screening mammogram for malignant neoplasm of breast: Secondary | ICD-10-CM | POA: Diagnosis present

## 2024-02-02 ENCOUNTER — Telehealth: Payer: Self-pay | Admitting: Oncology

## 2024-02-02 NOTE — Telephone Encounter (Signed)
 Yes please cancel our labs

## 2024-02-02 NOTE — Telephone Encounter (Signed)
 Patient said she got labs drawn at her PCP with Dr. Epifanio last week and said he drew the same labs that you were wanting when she comes on 2/12. She was wondering if we could just use those results instead of her having to come to that appt just for labs. I told her I would reach out and let you know and I would call her back. Please advise

## 2024-02-08 ENCOUNTER — Other Ambulatory Visit: Payer: Medicare Other

## 2024-05-11 ENCOUNTER — Encounter: Payer: Self-pay | Admitting: Oncology

## 2024-05-11 ENCOUNTER — Inpatient Hospital Stay: Payer: Medicare Other

## 2024-05-11 ENCOUNTER — Inpatient Hospital Stay: Payer: Medicare Other | Attending: Oncology | Admitting: Oncology

## 2024-05-11 DIAGNOSIS — E538 Deficiency of other specified B group vitamins: Secondary | ICD-10-CM | POA: Insufficient documentation

## 2024-05-11 DIAGNOSIS — D508 Other iron deficiency anemias: Secondary | ICD-10-CM

## 2024-05-11 DIAGNOSIS — D539 Nutritional anemia, unspecified: Secondary | ICD-10-CM | POA: Diagnosis not present

## 2024-05-11 DIAGNOSIS — D509 Iron deficiency anemia, unspecified: Secondary | ICD-10-CM | POA: Diagnosis present

## 2024-05-11 DIAGNOSIS — Z79899 Other long term (current) drug therapy: Secondary | ICD-10-CM | POA: Insufficient documentation

## 2024-05-11 LAB — CBC
HCT: 39.1 % (ref 36.0–46.0)
Hemoglobin: 12.9 g/dL (ref 12.0–15.0)
MCH: 33.3 pg (ref 26.0–34.0)
MCHC: 33 g/dL (ref 30.0–36.0)
MCV: 101 fL — ABNORMAL HIGH (ref 80.0–100.0)
Platelets: 168 10*3/uL (ref 150–400)
RBC: 3.87 MIL/uL (ref 3.87–5.11)
RDW: 13.9 % (ref 11.5–15.5)
WBC: 5 10*3/uL (ref 4.0–10.5)
nRBC: 0 % (ref 0.0–0.2)

## 2024-05-11 LAB — FERRITIN: Ferritin: 155 ng/mL (ref 11–307)

## 2024-05-11 LAB — VITAMIN B12: Vitamin B-12: 263 pg/mL (ref 180–914)

## 2024-05-11 LAB — IRON AND TIBC
Iron: 115 ug/dL (ref 28–170)
Saturation Ratios: 33 % — ABNORMAL HIGH (ref 10.4–31.8)
TIBC: 346 ug/dL (ref 250–450)
UIBC: 231 ug/dL

## 2024-05-11 NOTE — Progress Notes (Signed)
 Fatigue/weakness: YES Dyspena: NO  Light headedness: NO Blood in stool: NO

## 2024-05-11 NOTE — Progress Notes (Signed)
 Hematology/Oncology Consult note [facility]  Telephone:([phone] [phone]  Patient Care Team: Eartha Gold, MD as PCP - General (Infectious Diseases) Avonne Boettcher, MD as Consulting Physician (Oncology)   Name of the patient: Kristen Lloyd  [id number]  [birth year]   Date of visit: 05/11/24  Diagnosis-iron deficiency anemia  Chief complaint/ Reason for visit-routine follow-up of iron deficiency anemia  Heme/Onc history: patient is a 83 year old female with a past medical history of iron deficiency anemia.  She has undergone colon surgery for diverticulitis in the past when she was found to be anemic.  She takes chronic stool softeners.  Most recently she underwent hip surgery as well.  Her hemoglobin dropped following her hip surgery when she was noted to have an H&H of 9.9/30.9, baseline 12.  Ferritin levels were low at 13.  B12 levels were low at 77 and patient is getting B12 injections through her primary care doctor.  She denies any blood loss in her stool or urine.  She has a history of tortuous colon and unable to undergo colonoscopies and usually undergoes CT colonography for colon cancer screening.     Interval history-patient reports doing well overall.  Denies any blood loss in the stool or urine.  Denies any dark melanotic stools.  ECOG PS- 1 Pain scale- 0   Review of systems- Review of Systems  Constitutional:  Positive for malaise/fatigue. Negative for chills, fever and weight loss.  HENT:  Negative for congestion, ear discharge and nosebleeds.   Eyes:  Negative for blurred vision.  Respiratory:  Negative for cough, hemoptysis, sputum production, shortness of breath and wheezing.   Cardiovascular:  Negative for chest pain, palpitations, orthopnea and claudication.  Gastrointestinal:  Negative for abdominal pain, blood in stool, constipation, diarrhea, heartburn, melena, nausea and vomiting.  Genitourinary:  Negative for  dysuria, flank pain, frequency, hematuria and urgency.  Musculoskeletal:  Negative for back pain, joint pain and myalgias.  Skin:  Negative for rash.  Neurological:  Negative for dizziness, tingling, focal weakness, seizures, weakness and headaches.  Endo/Heme/Allergies:  Does not bruise/bleed easily.  Psychiatric/Behavioral:  Negative for depression and suicidal ideas. The patient does not have insomnia.       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   GERD (gastroesophageal reflux disease)    HTN (hypertension)    Hyperlipemia    Osteopenia    Palpitations    Primary osteoarthritis of left hip    Shingles    Spastic colon      Past Surgical History:  Procedure Laterality Date   ABDOMINAL HYSTERECTOMY     APPENDECTOMY     BREAST BIOPSY Left 1990's   lt bx x 2-neg   CARDIAC CATHETERIZATION N/A 01/19/2017   Procedure: Left Heart Cath and Coronary Angiography;  Surgeon: Ronney Cola, MD;  Location: [facility] INVASIVE CV LAB;  Service: Cardiovascular;  Laterality: N/A;   CARDIAC CATHETERIZATION  N/A 01/19/2017   Procedure: Coronary Stent Intervention;  Surgeon: Percival Brace, MD;  Location: [facility] INVASIVE CV LAB;  Service: Cardiovascular;  Laterality: N/A;   Ileostomy and reversal N/A 2021   TONSILLECTOMY     TOTAL HIP ARTHROPLASTY Left 05/13/2023   Procedure: TOTAL HIP ARTHROPLASTY;  Surgeon: Arlyne Lame, MD;  Location: [facility] ORS;  Service: Orthopedics;  Laterality: Left;     Social History   Socioeconomic History   Marital status: Married    Spouse name: Not on file   Number of children: Not on file   Years of education: Not on file   Highest education level: Not on file  Occupational History   Not on file  Tobacco Use   Smoking status: Never   Smokeless tobacco: Never  Vaping Use   Vaping status: Never Used  Substance and Sexual Activity   Alcohol use: Yes    Comment: 1 glass of martini   Drug use: No   Sexual activity: Not on file  Other Topics Concern   Not on file  Social History Narrative   Married and lives at home with husband.    Social Drivers of Corporate investment banker Strain: Low Risk  (01/27/2024)   Received from [facility]   Overall Financial Resource Strain (CARDIA)    Difficulty of Paying Living Expenses: Not hard at all  Food Insecurity: No Food Insecurity (01/27/2024)   Received from [facility]   Hunger Vital Sign    Worried About Running Out of Food in the Last Year: Never true    Ran Out of Food in the Last Year: Never true  Transportation Needs: No Transportation Needs (01/27/2024)   [facility] - Transportation    In the past 12 months, has lack of transportation kept you from medical appointments or from getting medications?: No    Lack of Transportation (Non-Medical): No  Physical Activity: Not on file  Stress: Not on file  Social Connections: Not on file  Intimate Partner Violence: Not At Risk (08/03/2023)   Humiliation, Afraid, Rape, and Kick questionnaire    Fear of Current or Ex-Partner: No    Emotionally Abused: No    Physically Abused: No    Sexually Abused: No    Family History  Problem Relation Age of Onset   Breast cancer Cousin 41       mat cousin     Current Outpatient Medications:    aspirin  EC 81 MG tablet, Take 1 tablet (81 mg total) by mouth 2 (two) times daily. Swallow whole. (Patient taking differently:  Take 81 mg by mouth daily. Swallow whole.), Disp: , Rfl:    cholecalciferol (VITAMIN D) 1000 UNITS tablet, Take 1,000 Units by mouth daily., Disp: , Rfl:    diclofenac Sodium (VOLTAREN) 1 % GEL, Apply 2 g topically in the morning and at bedtime., Disp: , Rfl:    docusate sodium  (COLACE) 50 MG capsule, Take 50 mg by mouth daily., Disp: , Rfl:    famotidine  (PEPCID ) 20 MG tablet, Take 20 mg by mouth as needed for heartburn or indigestion., Disp: , Rfl:    fenofibrate  160 MG tablet, Take 80 mg by mouth daily., Disp: , Rfl:    omeprazole  (PRILOSEC) 40 MG capsule, Take 40 mg by mouth daily., Disp: , Rfl:    traMADol  (ULTRAM ) 50 MG tablet, Take 1 tablet (50 mg total) by mouth every 4 (four) hours as needed  for moderate pain., Disp: 30 tablet, Rfl: 0   fluticasone  (FLONASE ) 50 MCG/ACT nasal spray, Place 1 spray into both nostrils 2 (two) times daily., Disp: 16 g, Rfl: 0 No current facility-administered medications for this visit.  Facility-Administered Medications Ordered in Other Visits:    0.9 %  sodium chloride  infusion, 250 mL, Intravenous, PRN, Cara Chancellor, Wiliam Harder, MD   [EXPIRED] 0.9% sodium chloride  infusion, 3 mL/kg/hr, Intravenous, Continuous **FOLLOWED BY** 0.9% sodium chloride  infusion, 1 mL/kg/hr, Intravenous, Continuous, Fath, Wiliam Harder, MD   sodium chloride  flush (NS) 0.9 % injection 3 mL, 3 mL, Intravenous, Q12H, Fath, Wiliam Harder, MD   sodium chloride  flush (NS) 0.9 % injection 3 mL, 3 mL, Intravenous, PRN, Ronney Cola, MD  Physical exam:  Vitals:   05/11/24 1406 05/11/24 1418  BP: (!) 148/78 (!) 152/75  Pulse: 64   Resp: 12   Temp: 98.1 F (36.7 C)   TempSrc: Tympanic   SpO2: 100%   Weight: 140 lb 8 oz (63.7 kg)   Height: 5' (1.524 m)    Physical Exam Cardiovascular:     Rate and Rhythm: Normal rate and regular rhythm.     Heart sounds: Normal heart sounds.  Pulmonary:     Effort: Pulmonary effort is normal.     Breath sounds: Normal breath sounds.  Abdominal:      General: Bowel sounds are normal.     Palpations: Abdomen is soft.  Skin:    General: Skin is warm and dry.  Neurological:     Mental Status: She is alert and oriented to person, place, and time.      I have personally reviewed labs listed below:    Latest Ref Rng & Units 05/06/2023    1:30 PM  CMP  Glucose 70 - 99 mg/dL 78   BUN 8 - 23 mg/dL 17   Creatinine 1.61 - 1.00 mg/dL 0.96   Sodium 045 - 409 mmol/L 139   Potassium 3.5 - 5.1 mmol/L 3.9   Chloride 98 - 111 mmol/L 103   CO2 22 - 32 mmol/L 26   Calcium 8.9 - 10.3 mg/dL 9.0   Total Protein 6.5 - 8.1 g/dL 6.6   Total Bilirubin 0.3 - 1.2 mg/dL 0.9   Alkaline Phos 38 - 126 U/L 61   AST 15 - 41 U/L 16   ALT 0 - 44 U/L 11       Latest Ref Rng & Units 05/11/2024    1:44 PM  CBC  WBC 4.0 - 10.5 K/uL 5.0   Hemoglobin 12.0 - 15.0 g/dL 81.1   Hematocrit 91.4 - 46.0 % 39.1   Platelets 150 - 400 K/uL 168      Assessment and plan- Patient is a 83 y.o. female for routine follow-up of iron deficiency anemia  Patient last received IV iron in August 2024.  She is presently not anemic with a hemoglobin of 12.9.  MCV is mildly elevated at 101.  Ferritin levels are normal at 155 with normal iron studies.  She does not require any IV iron at this time.  B12 levels are somewhat low at 263 and I will have her start taking oral B12 1000 mcg daily or we can offer her monthly B12 injections.  Thyroid  levels were checked in January 2025 and were within normal limits.  CBC ferritin and iron studies in 3 and 6 months and she will be seeing covering provider in 6 months.  I will check B12 folate and TSH again  in 3 months time   Visit Diagnosis 1. Other iron deficiency anemia   2. Macrocytic anemia      Dr. Seretha Dance, MD, MPH [facility] [phone] 05/11/2024 3:46 PM

## 2024-05-12 ENCOUNTER — Ambulatory Visit: Payer: Self-pay | Admitting: Oncology

## 2024-05-12 ENCOUNTER — Encounter: Payer: Self-pay | Admitting: Oncology

## 2024-05-14 ENCOUNTER — Telehealth: Payer: Self-pay | Admitting: *Deleted

## 2024-05-14 NOTE — Telephone Encounter (Signed)
 Patient called twice today and she was confused because she had a message last Friday and she did not understand that it sounded like a Athena Bland.  I think she was trying to say on the next sentence Randy Buttery but to her it sounded Rod.  I told her that they were just trying to get in touch with you about your labs-I told her that the iron studies were normal and the B12 is mildly low at 263 and Dr. Randy Buttery would suggest either taking B12 1000 mcg daily or take monthly B12 injections.  She says that she is going to take the B12 1000 mcg starting today until she comes back to see Dr. Randy Buttery at August 18 at 11 AM.  If it works she will continue doing it and if it does not she will need the injections but she says if she does get the injections she is going to go to her PCP and get it each time.  Just for now up to August she will be doing oral B12

## 2024-05-22 ENCOUNTER — Other Ambulatory Visit: Payer: Self-pay | Admitting: Nurse Practitioner

## 2024-05-22 DIAGNOSIS — I2510 Atherosclerotic heart disease of native coronary artery without angina pectoris: Secondary | ICD-10-CM

## 2024-05-22 DIAGNOSIS — R5381 Other malaise: Secondary | ICD-10-CM

## 2024-06-12 ENCOUNTER — Other Ambulatory Visit

## 2024-06-15 ENCOUNTER — Other Ambulatory Visit

## 2024-07-05 ENCOUNTER — Encounter: Payer: Self-pay | Admitting: Oncology

## 2024-07-05 NOTE — Telephone Encounter (Signed)
 Left message for patient to return call.

## 2024-07-05 NOTE — Telephone Encounter (Signed)
-----   Message from [facility] sent at 07/05/2024  3:11 PM EDT -----  ----- Message ----- From: Melanee Annah BROCKS, MD Sent: 05/12/2024  11:42 AM EDT To: Richerd Schmitz, CMA  Please let her know her iron studies are nromal. B12 is mildly low at 263. She will need to take po b12 1000 mcg daily or get monthly b12 injections

## 2024-07-10 ENCOUNTER — Other Ambulatory Visit

## 2024-08-06 ENCOUNTER — Telehealth: Payer: Self-pay | Admitting: Oncology

## 2024-08-06 NOTE — Telephone Encounter (Signed)
 Pt canceled lab appt on 08/13/2024. Stated she had labs done at PCP

## 2024-08-10 ENCOUNTER — Other Ambulatory Visit

## 2024-08-13 ENCOUNTER — Other Ambulatory Visit

## 2024-08-31 ENCOUNTER — Other Ambulatory Visit

## 2024-09-20 ENCOUNTER — Ambulatory Visit (LOCAL_COMMUNITY_HEALTH_CENTER)

## 2024-09-20 DIAGNOSIS — Z23 Encounter for immunization: Secondary | ICD-10-CM

## 2024-09-20 DIAGNOSIS — Z719 Counseling, unspecified: Secondary | ICD-10-CM

## 2024-09-20 NOTE — Progress Notes (Signed)
 Pt presents for High Dose Flu vaccine. Patient counseled and given VIS Patient tolerated well.  Copies of [facility] given.  Kandi KATHEE Glatter, RN

## 2024-10-10 ENCOUNTER — Ambulatory Visit: Admission: RE | Admit: 2024-10-10 | Source: Ambulatory Visit

## 2024-11-05 ENCOUNTER — Emergency Department

## 2024-11-05 ENCOUNTER — Observation Stay
Admission: EM | Admit: 2024-11-05 | Discharge: 2024-11-06 | Disposition: A | Discharge status: Home / Self Care | Attending: Student in an Organized Health Care Education/Training Program | Admitting: Student in an Organized Health Care Education/Training Program

## 2024-11-05 ENCOUNTER — Encounter: Payer: Self-pay | Admitting: Emergency Medicine

## 2024-11-05 ENCOUNTER — Other Ambulatory Visit: Payer: Self-pay

## 2024-11-05 ENCOUNTER — Observation Stay

## 2024-11-05 DIAGNOSIS — M25552 Pain in left hip: Secondary | ICD-10-CM | POA: Insufficient documentation

## 2024-11-05 DIAGNOSIS — Z96652 Presence of left artificial knee joint: Secondary | ICD-10-CM | POA: Insufficient documentation

## 2024-11-05 DIAGNOSIS — E785 Hyperlipidemia, unspecified: Secondary | ICD-10-CM | POA: Insufficient documentation

## 2024-11-05 DIAGNOSIS — M542 Cervicalgia: Secondary | ICD-10-CM | POA: Diagnosis not present

## 2024-11-05 DIAGNOSIS — I639 Cerebral infarction, unspecified: Principal | ICD-10-CM | POA: Diagnosis present

## 2024-11-05 DIAGNOSIS — R0789 Other chest pain: Secondary | ICD-10-CM | POA: Diagnosis not present

## 2024-11-05 DIAGNOSIS — T07XXXA Unspecified multiple injuries, initial encounter: Secondary | ICD-10-CM | POA: Insufficient documentation

## 2024-11-05 DIAGNOSIS — I6389 Other cerebral infarction: Principal | ICD-10-CM | POA: Insufficient documentation

## 2024-11-05 DIAGNOSIS — R42 Dizziness and giddiness: Secondary | ICD-10-CM | POA: Diagnosis present

## 2024-11-05 DIAGNOSIS — I200 Unstable angina: Secondary | ICD-10-CM | POA: Diagnosis present

## 2024-11-05 DIAGNOSIS — Z96642 Presence of left artificial hip joint: Secondary | ICD-10-CM | POA: Insufficient documentation

## 2024-11-05 DIAGNOSIS — M25562 Pain in left knee: Secondary | ICD-10-CM | POA: Diagnosis not present

## 2024-11-05 DIAGNOSIS — I10 Essential (primary) hypertension: Secondary | ICD-10-CM | POA: Insufficient documentation

## 2024-11-05 DIAGNOSIS — K219 Gastro-esophageal reflux disease without esophagitis: Secondary | ICD-10-CM | POA: Insufficient documentation

## 2024-11-05 DIAGNOSIS — W19XXXA Unspecified fall, initial encounter: Secondary | ICD-10-CM | POA: Diagnosis not present

## 2024-11-05 DIAGNOSIS — I25110 Atherosclerotic heart disease of native coronary artery with unstable angina pectoris: Secondary | ICD-10-CM | POA: Insufficient documentation

## 2024-11-05 DIAGNOSIS — Z7982 Long term (current) use of aspirin: Secondary | ICD-10-CM | POA: Diagnosis not present

## 2024-11-05 DIAGNOSIS — I700 Atherosclerosis of aorta: Secondary | ICD-10-CM | POA: Insufficient documentation

## 2024-11-05 DIAGNOSIS — Z7902 Long term (current) use of antithrombotics/antiplatelets: Secondary | ICD-10-CM | POA: Diagnosis not present

## 2024-11-05 DIAGNOSIS — Z79899 Other long term (current) drug therapy: Secondary | ICD-10-CM | POA: Insufficient documentation

## 2024-11-05 HISTORY — DX: Transient cerebral ischemic attack, unspecified: G45.9

## 2024-11-05 HISTORY — DX: Cerebral infarction, unspecified: I63.9

## 2024-11-05 LAB — URINALYSIS, ROUTINE W REFLEX MICROSCOPIC
Bilirubin Urine: NEGATIVE
Glucose, UA: NEGATIVE mg/dL
Hgb urine dipstick: NEGATIVE
Ketones, ur: NEGATIVE mg/dL
Nitrite: NEGATIVE
Protein, ur: NEGATIVE mg/dL
Specific Gravity, Urine: 1.008 (ref 1.005–1.030)
pH: 8 (ref 5.0–8.0)

## 2024-11-05 LAB — CBC WITH DIFFERENTIAL/PLATELET
Abs Immature Granulocytes: 0.03 K/uL (ref 0.00–0.07)
Basophils Absolute: 0 K/uL (ref 0.0–0.1)
Basophils Relative: 1 %
Eosinophils Absolute: 0.1 K/uL (ref 0.0–0.5)
Eosinophils Relative: 1 %
HCT: 40.5 % (ref 36.0–46.0)
Hemoglobin: 13.4 g/dL (ref 12.0–15.0)
Immature Granulocytes: 1 %
Lymphocytes Relative: 36 %
Lymphs Abs: 1.6 K/uL (ref 0.7–4.0)
MCH: 33.4 pg (ref 26.0–34.0)
MCHC: 33.1 g/dL (ref 30.0–36.0)
MCV: 101 fL — ABNORMAL HIGH (ref 80.0–100.0)
Monocytes Absolute: 0.4 K/uL (ref 0.1–1.0)
Monocytes Relative: 10 %
Neutro Abs: 2.3 K/uL (ref 1.7–7.7)
Neutrophils Relative %: 51 %
Platelets: 189 K/uL (ref 150–400)
RBC: 4.01 MIL/uL (ref 3.87–5.11)
RDW: 14.2 % (ref 11.5–15.5)
WBC: 4.4 K/uL (ref 4.0–10.5)
nRBC: 0 % (ref 0.0–0.2)

## 2024-11-05 LAB — PROTIME-INR
INR: 0.9 (ref 0.8–1.2)
Prothrombin Time: 13 s (ref 11.4–15.2)

## 2024-11-05 LAB — COMPREHENSIVE METABOLIC PANEL WITH GFR
ALT: 13 U/L (ref 0–44)
AST: 23 U/L (ref 15–41)
Albumin: 3.8 g/dL (ref 3.5–5.0)
Alkaline Phosphatase: 53 U/L (ref 38–126)
Anion gap: 17 — ABNORMAL HIGH (ref 5–15)
BUN: 12 mg/dL (ref 8–23)
CO2: 22 mmol/L (ref 22–32)
Calcium: 9.2 mg/dL (ref 8.9–10.3)
Chloride: 106 mmol/L (ref 98–111)
Creatinine, Ser: 0.81 mg/dL (ref 0.44–1.00)
GFR, Estimated: >60 mL/min (ref >60)
Glucose, Bld: 104 mg/dL — ABNORMAL HIGH (ref 70–99)
Potassium: 3.6 mmol/L (ref 3.5–5.1)
Sodium: 145 mmol/L (ref 135–145)
Total Bilirubin: 1 mg/dL (ref 0.0–1.2)
Total Protein: 7.2 g/dL (ref 6.5–8.1)

## 2024-11-05 LAB — CBC
HCT: 40.5 % (ref 36.0–46.0)
Hemoglobin: 13.4 g/dL (ref 12.0–15.0)
MCH: 33.5 pg (ref 26.0–34.0)
MCHC: 33.1 g/dL (ref 30.0–36.0)
MCV: 101.3 fL — ABNORMAL HIGH (ref 80.0–100.0)
Platelets: 174 K/uL (ref 150–400)
RBC: 4 MIL/uL (ref 3.87–5.11)
RDW: 14.4 % (ref 11.5–15.5)
WBC: 7 K/uL (ref 4.0–10.5)
nRBC: 0 % (ref 0.0–0.2)

## 2024-11-05 LAB — URINE DRUG SCREEN, QUALITATIVE (ARMC ONLY)
Amphetamines, Ur Screen: NOT DETECTED
Barbiturates, Ur Screen: NOT DETECTED
Benzodiazepine, Ur Scrn: NOT DETECTED
Cannabinoid 50 Ng, Ur ~~LOC~~: NOT DETECTED
Cocaine Metabolite,Ur ~~LOC~~: NOT DETECTED
MDMA (Ecstasy)Ur Screen: NOT DETECTED
Methadone Scn, Ur: NOT DETECTED
Opiate, Ur Screen: NOT DETECTED
Phencyclidine (PCP) Ur S: NOT DETECTED
Tricyclic, Ur Screen: NOT DETECTED

## 2024-11-05 LAB — TROPONIN I (HIGH SENSITIVITY)
Troponin I (High Sensitivity): 7 ng/L (ref <18)
Troponin I (High Sensitivity): 23 ng/L — ABNORMAL HIGH (ref <18)

## 2024-11-05 LAB — CREATININE, SERUM
Creatinine, Ser: 0.71 mg/dL (ref 0.44–1.00)
GFR, Estimated: >60 mL/min (ref >60)

## 2024-11-05 LAB — CBG MONITORING, ED: Glucose-Capillary: 119 mg/dL — ABNORMAL HIGH (ref 70–99)

## 2024-11-05 LAB — APTT: aPTT: 26 s (ref 24–36)

## 2024-11-05 MED ORDER — LORAZEPAM 2 MG/ML IJ SOLN
0.5000 mg | Freq: Once | INTRAMUSCULAR | Status: AC
  Administered 2024-11-05: 0.5 mg via INTRAVENOUS
  Filled 2024-11-05: qty 1

## 2024-11-05 MED ORDER — FAMOTIDINE 20 MG PO TABS
20.0000 mg | ORAL_TABLET | ORAL | Status: DC | PRN
Start: 2024-11-05 — End: 2024-11-06
  Administered 2024-11-05 – 2024-11-06 (×2): 20 mg via ORAL
  Filled 2024-11-05 (×2): qty 1

## 2024-11-05 MED ORDER — SODIUM CHLORIDE 0.9 % IV SOLN: INTRAVENOUS | Status: DC

## 2024-11-05 MED ORDER — HYDRALAZINE HCL 20 MG/ML IJ SOLN
10.0000 mg | Freq: Once | INTRAMUSCULAR | Status: AC
  Administered 2024-11-05: 10 mg via INTRAVENOUS
  Filled 2024-11-05: qty 1

## 2024-11-05 MED ORDER — ASPIRIN 81 MG PO TBEC
81.0000 mg | DELAYED_RELEASE_TABLET | Freq: Every day | ORAL | Status: DC
  Administered 2024-11-05 – 2024-11-06 (×2): 81 mg via ORAL
  Filled 2024-11-05: qty 1

## 2024-11-05 MED ORDER — FENOFIBRATE 160 MG PO TABS
80.0000 mg | ORAL_TABLET | Freq: Every day | ORAL | Status: DC
  Filled 2024-11-05: qty 0.5

## 2024-11-05 MED ORDER — ONDANSETRON HCL 4 MG/2ML IJ SOLN
4.0000 mg | Freq: Once | INTRAMUSCULAR | Status: AC
  Administered 2024-11-05: 4 mg via INTRAVENOUS
  Filled 2024-11-05: qty 2

## 2024-11-05 MED ORDER — CLOPIDOGREL BISULFATE 75 MG PO TABS
75.0000 mg | ORAL_TABLET | Freq: Every day | ORAL | Status: DC
  Administered 2024-11-06: 75 mg via ORAL
  Filled 2024-11-05: qty 1

## 2024-11-05 MED ORDER — IOHEXOL 350 MG/ML SOLN
75.0000 mL | Freq: Once | INTRAVENOUS | Status: AC | PRN
  Administered 2024-11-05: 75 mL via INTRAVENOUS

## 2024-11-05 MED ORDER — ACETAMINOPHEN 160 MG/5ML PO SOLN
650.0000 mg | ORAL | Status: DC | PRN
  Administered 2024-11-05: 650 mg
  Filled 2024-11-05: qty 20.3

## 2024-11-05 MED ORDER — STROKE: EARLY STAGES OF RECOVERY BOOK: Freq: Once | Status: AC

## 2024-11-05 MED ORDER — SENNOSIDES-DOCUSATE SODIUM 8.6-50 MG PO TABS: 1.0000 | ORAL_TABLET | Freq: Every evening | ORAL | Status: DC | PRN

## 2024-11-05 MED ORDER — ACETAMINOPHEN 650 MG RE SUPP
650.0000 mg | RECTAL | Status: DC | PRN
  Filled 2024-11-05: qty 1

## 2024-11-05 MED ORDER — ACETAMINOPHEN 325 MG PO TABS
650.0000 mg | ORAL_TABLET | ORAL | Status: DC | PRN
  Filled 2024-11-05: qty 2

## 2024-11-05 MED ORDER — ENOXAPARIN SODIUM 40 MG/0.4ML IJ SOSY
40.0000 mg | PREFILLED_SYRINGE | INTRAMUSCULAR | Status: DC
  Administered 2024-11-05: 40 mg via SUBCUTANEOUS
  Filled 2024-11-05: qty 0.4

## 2024-11-05 MED ORDER — MORPHINE SULFATE (PF) 4 MG/ML IV SOLN
4.0000 mg | Freq: Once | INTRAVENOUS | Status: AC
  Administered 2024-11-05: 4 mg via INTRAVENOUS
  Filled 2024-11-05: qty 1

## 2024-11-05 MED ORDER — CLOPIDOGREL BISULFATE 75 MG PO TABS
75.0000 mg | ORAL_TABLET | Freq: Once | ORAL | Status: AC
  Administered 2024-11-05: 75 mg via ORAL
  Filled 2024-11-05: qty 1

## 2024-11-05 NOTE — H&P (Signed)
 History and Physical    Kristen Lloyd FMW:[id number] DOB: [birth year] DOA: 11/05/2024  DOS: the patient was seen and examined on 11/05/2024  PCP: Epifanio Alm SQUIBB, MD   Patient coming from: Home  I have personally briefly reviewed patient's old medical records in [facility]  Chief Complaint: Fall at home  HPI: Kristen Lloyd is a pleasant 83 y.o. female with medical history significant for TIA, cerebellar infarct, diverticulitis, CAD, hyperlipidemia, HTN, osteoarthritis, hip replacement, osteopenia who presented to ED complaining of fall when she was trying to bend down to pick something up from the chair.  She stated that she was able to get up but was unsteady on her feet.  She was dizzy.  She complained of pain on the right lateral rib area and left knee.  ED Course: Upon arrival to the ED, patient is found to have positive CT scan of the brain acute or subacute cerebellar infarct, CT cervical spine showed no fracture, MRI of the brain showed acute BL cerebellar infarct.  Hospitalist service was consulted for evaluation for admission.  Review of Systems:  ROS  All other systems negative except as noted in the HPI.  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   GERD (gastroesophageal reflux disease)    HTN (hypertension)    Hyperlipemia    Osteopenia    Palpitations    Primary osteoarthritis of left hip    Shingles    Spastic colon     Past Surgical History:  Procedure Laterality Date   ABDOMINAL HYSTERECTOMY     APPENDECTOMY     BREAST BIOPSY Left 1990's   lt bx x 2-neg   CARDIAC CATHETERIZATION N/A 01/19/2017    Procedure: Left Heart Cath and Coronary Angiography;  Surgeon: Vinie DELENA Jude, MD;  Location: [facility] INVASIVE CV LAB;  Service: Cardiovascular;  Laterality: N/A;   CARDIAC CATHETERIZATION N/A 01/19/2017   Procedure: Coronary Stent Intervention;  Surgeon: Marsa Dooms, MD;  Location: [facility] INVASIVE CV LAB;  Service: Cardiovascular;  Laterality: N/A;   Ileostomy and reversal N/A 2021   TONSILLECTOMY     TOTAL HIP ARTHROPLASTY Left 05/13/2023   Procedure: TOTAL HIP ARTHROPLASTY;  Surgeon: Mardee Lynwood SQUIBB, MD;  Location: [facility] ORS;  Service: Orthopedics;  Laterality: Left;     reports that she has never smoked. She has never used smokeless tobacco. She reports current alcohol use. She reports that she does not use drugs.  Allergies  Allergen Reactions   Flagyl [Metronidazole]    Levofloxacin    Moxifloxacin    Septra [Sulfamethoxazole-Trimethoprim] Hives    Family History  Problem Relation Age of Onset   Breast cancer Cousin 70       mat cousin    Prior to Admission medications   Medication Sig Start Date End Date Taking? Authorizing Provider  cholecalciferol (VITAMIN D) 1000 UNITS tablet Take 1,000 Units by mouth daily.    [provider]  diclofenac Sodium (VOLTAREN) 1 % GEL Apply 2 g topically in the morning and at bedtime.    [provider]  docusate sodium  (COLACE) 50  MG capsule Take 50 mg by mouth daily.    [provider]  famotidine  (PEPCID ) 20 MG tablet Take 20 mg by mouth as needed for heartburn or indigestion.    [provider]  fenofibrate  160 MG tablet Take 80 mg by mouth daily.    [provider]  fluticasone  (FLONASE ) 50 MCG/ACT nasal spray Place 1 spray into both nostrils 2 (two) times daily. 06/18/18 08/03/23  Betancourt, Ellouise LABOR, NP  omeprazole  (PRILOSEC) 40 MG capsule Take 40 mg by mouth daily.    [provider]  traMADol  (ULTRAM ) 50 MG tablet Take 1 tablet (50 mg total) by mouth every 4 (four) hours as needed for  moderate pain. 05/14/23   Verlinda Boas, PA-C    Physical Exam: Vitals:   11/05/24 1020 11/05/24 1026 11/05/24 1232 11/05/24 1322  BP: (!) 193/98 (!) 148/81 (!) 143/100 (!) 141/85  Pulse: 70 72 (!) 103 (!) 112  Resp: 18 18 18 18   Temp:   97.8 F (36.6 C)   TempSrc:   Oral   SpO2: 100% 100% 100% 100%  Weight:      Height:        Physical Exam   Constitutional: Alert, awake, calm, comfortable HEENT: Neck supple Respiratory: Clear to auscultation B/L, no wheezing, no rales.  Cardiovascular: Regular rate and rhythm, no murmurs / rubs / gallops. No extremity edema. 2+ pedal pulses. No carotid bruits.  Abdomen: Soft, no tenderness, Bowel sounds positive.  Musculoskeletal: no clubbing / cyanosis. Good ROM, no contractures. Normal muscle tone.  Skin: no rashes, lesions, ulcers. Neurologic: CN 2-12 grossly intact. Sensation intact, No focal deficit identified Psychiatric: Alert and oriented x 3. Normal mood.    Labs on Admission: I have personally reviewed following labs and imaging studies  CBC: Recent Labs  Lab 11/05/24 0954  WBC 4.4  NEUTROABS 2.3  HGB 13.4  HCT 40.5  MCV 101.0*  PLT 189   Basic Metabolic Panel: Recent Labs  Lab 11/05/24 1306  NA 145  K 3.6  CL 106  CO2 22  GLUCOSE 104*  BUN 12  CREATININE 0.81  CALCIUM 9.2   GFR: Estimated Creatinine Clearance: 42.3 mL/min (by C-G formula based on SCr of 0.81 mg/dL). Liver Function Tests: Recent Labs  Lab 11/05/24 1306  AST 23  ALT 13  ALKPHOS 53  BILITOT 1.0  PROT 7.2  ALBUMIN 3.8   No results for input(s): LIPASE, AMYLASE in the last 168 hours. No results for input(s): AMMONIA in the last 168 hours. Coagulation Profile: Recent Labs  Lab 11/05/24 1306  INR 0.9   Cardiac Enzymes: Recent Labs  Lab 11/05/24 0954 11/05/24 1100  TROPONINIHS 7 23*   BNP (last 3 results) No results for input(s): BNP in the last 8760 hours. HbA1C: No results for input(s): HGBA1C in the last 72  hours. CBG: No results for input(s): GLUCAP in the last 168 hours. Lipid Profile: No results for input(s): CHOL, HDL, LDLCALC, TRIG, CHOLHDL, LDLDIRECT in the last 72 hours. Thyroid  Function Tests: No results for input(s): TSH, T4TOTAL, FREET4, T3FREE, THYROIDAB in the last 72 hours. Anemia Panel: No results for input(s): VITAMINB12, FOLATE, FERRITIN, TIBC, IRON, RETICCTPCT in the last 72 hours. Urine analysis:    Component Value Date/Time   COLORURINE YELLOW (A) 11/05/2024 1021   APPEARANCEUR CLEAR (A) 11/05/2024 1021   APPEARANCEUR HAZY 06/17/2014 0821   LABSPEC 1.008 11/05/2024 1021   LABSPEC 1.025 06/17/2014 0821   PHURINE 8.0 11/05/2024 1021   GLUCOSEU NEGATIVE  11/05/2024 1021   GLUCOSEU NEGATIVE 06/17/2014 0821   HGBUR NEGATIVE 11/05/2024 1021   BILIRUBINUR NEGATIVE 11/05/2024 1021   BILIRUBINUR NEGATIVE 06/17/2014 0821   KETONESUR NEGATIVE 11/05/2024 1021   PROTEINUR NEGATIVE 11/05/2024 1021   NITRITE NEGATIVE 11/05/2024 1021   LEUKOCYTESUR TRACE (A) 11/05/2024 1021   LEUKOCYTESUR 1+ 06/17/2014 0821    Radiological Exams on Admission: I have personally reviewed images MR BRAIN WO CONTRAST Result Date: 11/05/2024 EXAM: MRI BRAIN WITHOUT CONTRAST 11/05/2024 02:06:28 PM TECHNIQUE: Multiplanar multisequence MRI of the head/brain was performed without the administration of intravenous contrast. COMPARISON: Same day CT head. CLINICAL HISTORY: Transient ischemic attack (TIA). FINDINGS: BRAIN AND VENTRICLES: There is a 4 mm focus of acute infarct within the right temporal periventricular white matter. Multiple remote infarcts are present in the bilateral cerebellum, more pronounced on the right. Remote cortical infarcts are noted in the bilateral frontal lobes. Susceptibility along the sulci in the right occipital lobe is suggestive of prior subarachnoid hemorrhage. A remote lacunar infarct is present in the left thalamus. A remote infarct is seen  in the left corona radiata. Scattered supratentorial white matter signal abnormalities are suggestive of mild chronic microvascular ischemic changes. No acute intracranial hemorrhage. No mass. No midline shift. No hydrocephalus. The sella is unremarkable. Normal flow voids. ORBITS: No acute abnormality. SINUSES AND MASTOIDS: Mild mucosal thickening in the right sphenoid sinus. Trace fluid in the right mastoid tip. BONES AND SOFT TISSUES: Normal marrow signal. No acute soft tissue abnormality. Changes in the visualized upper cervical spine: Disc osteophyte complex at C3-C4 likely contributing to spinal canal narrowing. VISUALIZED LUNGS: Changes of the right and left pleural effusions are not obvious. IMPRESSION: 1. 4 mm focus of acute infarct within the right temporal periventricular white matter. 2. Multiple remote infarcts in the bilateral cerebellum, more pronounced on the right. 3. Remote cortical infarcts in the bilateral frontal lobes. Remote lacunar infarct in the left thalamus and the left corona radiata. 4. Susceptibility along the sulci in the right occipital lobe suggestive of prior subarachnoid hemorrhage. 5. Mild chronic microvascular ischemic changes. 6. Disc osteophyte complex at C3-C4 likely contributing to spinal canal narrowing. Electronically signed by: Donnice Mania MD 11/05/2024 02:44 PM EST RP Workstation: [id number]   DG Knee Complete 4 Views Left Result Date: 11/05/2024 CLINICAL DATA:  Left knee pain after a fall. EXAM: LEFT KNEE - COMPLETE 4+ VIEW COMPARISON:  None Available. FINDINGS: No joint effusion or fracture. Tricompartment osteophytosis. Medial joint space narrowing. IMPRESSION: 1. No acute findings. 2. Tricompartment osteoarthritis. Electronically Signed   By: Newell Eke M.D.   On: 11/05/2024 11:43   DG Hip Unilat W or Wo Pelvis 2-3 Views Left Result Date: 11/05/2024 CLINICAL DATA:  Fall with left hip pain. EXAM: DG HIP (WITH OR WITHOUT PELVIS) 2-3V LEFT COMPARISON:  None  Available. FINDINGS: Left hip arthroplasty. No fracture or dislocation. Osteopenia. Degenerative changes in the spine. IMPRESSION: No acute findings. Electronically Signed   By: Newell Eke M.D.   On: 11/05/2024 11:38   DG Ribs Unilateral W/Chest Right Result Date: 11/05/2024 CLINICAL DATA:  Fall, right upper anterior rib pain. EXAM: RIGHT RIBS AND CHEST - 3+ VIEW COMPARISON:  10/27/2013. FINDINGS: Frontal view of the chest shows midline trachea. Heart size normal. Thoracic aorta is calcified. Lungs are mildly hyperinflated but clear. No pleural fluid. No pneumothorax. Dedicated views of the right ribs show no definite acute fracture. IMPRESSION: No acute findings. Electronically Signed   By: Newell Eke M.D.   On:  11/05/2024 11:37   CT Cervical Spine Wo Contrast Result Date: 11/05/2024 EXAM: CT CERVICAL SPINE WITHOUT CONTRAST 11/05/2024 11:02:00 AM TECHNIQUE: CT of the cervical spine was performed without the administration of intravenous contrast. Multiplanar reformatted images are provided for review. Automated exposure control, iterative reconstruction, and/or weight based adjustment of the mA/kV was utilized to reduce the radiation dose to as low as reasonably achievable. COMPARISON: Head CT today reported separately. CLINICAL HISTORY: Neck trauma (Age >= 65y). FINDINGS: CERVICAL SPINE: BONES AND ALIGNMENT: No acute fracture or traumatic malalignment. DEGENERATIVE CHANGES: Chronic severe cervical spine degeneration and degenerative appearing ankylosis of the C2-C3, C4 through C6 cervical levels. Similar developing ankylosis in the lower cervical spine, and in the visible upper thoracic spine. Chronic severe C1-C2 degeneration superimposed with complete bilateral C1-C2 joint space loss, bulky osteophytosis, and sclerosis. Associated bulky ligamentous hypertrophy about the odontoid. Subsequent mild spinal stenosis at the cervicomedullary junction and C1 level. SOFT TISSUES: No prevertebral soft  tissue swelling. Calcified cervical carotid atherosclerosis. Partially visible aortic arch calcified atherosclerosis. Otherwise negative visible non-contrast thoracic inlet. IMPRESSION: 1. No acute traumatic injury identified in the cervical spine. 2. 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 Very severe C1-C2 degeneration with spinal stenosis (probably mild) at the cervicomedullary junction and C1 level. Electronically signed by: Helayne Hurst MD 11/05/2024 11:14 AM EST RP Workstation: [id number]   CT HEAD WO CONTRAST ( ) Result Date: 11/05/2024 EXAM: CT HEAD WITHOUT CONTRAST 11/05/2024 11:02:00 AM TECHNIQUE: CT of the head was performed without the administration of intravenous contrast. Automated exposure control, iterative reconstruction, and/or weight based adjustment of the mA/kV was utilized to reduce the radiation dose to as low as reasonably achievable. COMPARISON: None available. CLINICAL HISTORY: 83 year old female status post fall, dizziness, T1. FINDINGS: BRAIN AND VENTRICLES: Brain volume within normal limits for age. Mix of circumscribed and indistinct hypodensity in the right cerebellum, in part chronic right SCA territory infarct with encephalomalacia, but difficult to exclude superimposed acute or subacute cerebellar infarct on that side. Contralateral small but circumscribed left cerebellar infarcts appear chronic. Supratentorial gray white differentiation better maintained. Mild for age cerebral white matter hypodensity. Extensive calcified atherosclerosis at the skull base. No suspicious intracranial vascular hyperdensity. No acute hemorrhage. No hydrocephalus. No extra-axial collection. No mass effect or midline shift. ORBITS: No acute abnormality. No orbital scalp soft tissue injury identified. SINUSES: Chronic right sphenoid sinus mucoperiosteal thickening. Other visible paranasal sinuses, mastoids, and milia are well aerated. SOFT  TISSUES AND SKULL: Left vertex scalp sebaceous type cyst. No acute soft tissue abnormality. No skull fracture. Partially visible advanced cervical spine degeneration. IMPRESSION: 1. Advanced chronic cerebellar ischemia, And cannot exclude acute or subacute ischemia in the right cerebellum. 2. No other acute intracranial abnormality. No acute traumatic injury identified. Electronically signed by: Helayne Hurst MD 11/05/2024 11:11 AM EST RP Workstation: [id number]    EKG: My personal interpretation of EKG shows: Normal sinus rhythm    Assessment/Plan Principal Problem:   Acute stroke due to ischemia [facility]) Active Problems:   Unstable angina (HCC)   HTN (hypertension)   Hyperlipidemia    Assessment and Plan: 83 year old female who came into ED complaining of a fall at home feeling like she had stroke.  1.  Acute cerebellar stroke - She will be placed in observation - Placed on TIA/stroke protocol - MRI of the brain was already done - EDP called neurology will see the patient. - She will get aspirin  and Plavix , CT angio head and neck,  2D echo, A1c, lipid panel. - PT/OT/ST  2.  HTN - It appears that she does not take any medication at home - She will be permissive hypertension at this point  3.  GERD Resume Pepcid   4.  HLD - Resume home medications    DVT prophylaxis: Lovenox  Code Status: Full Code Family Communication: Family at bedside Disposition Plan: Home Consults called: Neurology Admission status: Observation, Telemetry bed   Nena Rebel, MD [facility] 11/05/2024, 4:33 PM

## 2024-11-05 NOTE — ED Notes (Signed)
 This EDT was doing 1x assist pt to the restroom. Pt was communicating and able to lift herself up when getting in wheel chair. Once pt was done using the bathroom, she stated she felt funny. Pt began to slump back on toilet, and I put my hand behind her head to prevent a head injury. I noticed her left side became droopy and she wasn't able to grab bar beside commode. This is when I rang the call light and received assistance from ED nurse. We were able to assist pt back in wheel chair to take back to room. Doctor called and pt placed back on monitor.

## 2024-11-05 NOTE — ED Triage Notes (Addendum)
 Presents via EMS from home  S/p fall  States she bent down to pick something up  fell from chair  States she was able to get up but was unsteady on her feet  Dizzy Having pain to right lateral rib area  and left knee  States she had some visual changes PTA   and when EMS arrived  States her vision was normal  Speech clear

## 2024-11-05 NOTE — ED Provider Notes (Signed)
 [facility] Provider Note    Event Date/Time   First MD Initiated Contact with Patient 11/05/24 [phone]     (approximate)   History   Fall (/)   HPI  Kristen Lloyd is a 83 y.o. female Stru[place] TIA, cerebral infarct, diverticulitis, CAD, hyperlipidemia, hypertension, osteoarthritis, left hip replacement, osteopenia presents emergency department stating she felt like she had a TIA.  Patient states that she bent down to tie her shoes and did not feel like her arms and legs were working, fell to the floor got a little dizzy.  States was able to get herself off the floor and went to sit in a chair and the symptoms happened again.  States she does not take a regular blood pressure medication.  Denies chest pain, shortness of breath.  States she is feeling little better now.  Complaining of some neck pain, right rib pain, left hip and left knee pain.  Does not have blood thinner but does take a baby aspirin  per day.      Physical Exam   Triage Vital Signs: ED Triage Vitals  Encounter Vitals Group     BP --      Girls Systolic BP Percentile --      Girls Diastolic BP Percentile --      Boys Systolic BP Percentile --      Boys Diastolic BP Percentile --      Pulse --      Resp --      Temp --      Temp src --      SpO2 --      Weight 11/05/24 0936 140 lb 6.9 oz (63.7 kg)     Height 11/05/24 0936 5' (1.524 m)     Head Circumference --      Peak Flow --      Pain Score 11/05/24 0946 3     Pain Loc --      Pain Education --      Exclude from Growth Chart --     Most recent vital signs: Vitals:   11/05/24 1232 11/05/24 1322  BP: (!) 143/100 (!) 141/85  Pulse: (!) 103 (!) 112  Resp: 18 18  Temp: 97.8 F (36.6 C)   SpO2: 100% 100%     General: Awake, no distress.   CV:  Good peripheral perfusion. regular rate and  rhythm Resp:  Normal effort. Lungs cta Abd:  No distention.   Other:  Cranial nerves II through XII grossly intact, grips equal  bilaterally, no slurred speech   ED Results / Procedures / Treatments   Labs (all labs ordered are listed, but only abnormal results are displayed) Labs Reviewed  CBC WITH DIFFERENTIAL/PLATELET - Abnormal; Notable for the following components:      Result Value   MCV 101.0 (*)    All other components within normal limits  URINALYSIS, ROUTINE W REFLEX MICROSCOPIC - Abnormal; Notable for the following components:   Color, Urine YELLOW (*)    APPearance CLEAR (*)    Leukocytes,Ua TRACE (*)    Bacteria, UA RARE (*)    All other components within normal limits  COMPREHENSIVE METABOLIC PANEL WITH GFR - Abnormal; Notable for the following components:   Glucose, Bld 104 (*)    Anion gap 17 (*)    All other components within normal limits  TROPONIN I (HIGH SENSITIVITY) - Abnormal; Notable for the following components:   Troponin I (High Sensitivity) 23 (*)  All other components within normal limits  PROTIME-INR  APTT  URINE DRUG SCREEN, QUALITATIVE ([facility] ONLY)  TROPONIN I (HIGH SENSITIVITY)     EKG  EKG   RADIOLOGY CT head, C-spine, x-ray of the right ribs, left hip, left knee    PROCEDURES:   .Critical Care  Performed by: Gasper Devere ORN, PA-C Authorized by: Gasper Devere ORN, PA-C   Critical care provider statement:    Critical care time (minutes):  45   Critical care time was exclusive of:  Separately billable procedures and treating other patients   Critical care was necessary to treat or prevent imminent or life-threatening deterioration of the following conditions:  CNS failure or compromise   Critical care was time spent personally by me on the following activities:  Blood draw for specimens, development of treatment plan with patient or surrogate, discussions with consultants, evaluation of patient's response to treatment, examination of patient, interpretation of cardiac output measurements, obtaining history from patient or surrogate, ordering and performing  treatments and interventions, ordering and review of laboratory studies, ordering and review of radiographic studies, re-evaluation of patient's condition and review of old charts   Care discussed with: admitting provider   Comments:     And neurology, Dr Voncile   Critical Care:  yes Chief Complaint  Patient presents with   Fall           MEDICATIONS ORDERED IN ED: Medications  clopidogrel  (PLAVIX ) tablet 75 mg (has no administration in time range)  morphine (PF) 4 MG/ML injection 4 mg (has no administration in time range)  ondansetron  (ZOFRAN ) injection 4 mg (has no administration in time range)  hydrALAZINE  (APRESOLINE ) injection 10 mg (10 mg Intravenous Given 11/05/24 1023)  LORazepam (ATIVAN) injection 0.5 mg (0.5 mg Intravenous Given 11/05/24 1327)     IMPRESSION / MDM / ASSESSMENT AND PLAN / ED COURSE  I reviewed the triage vital signs and the nursing notes.                              Differential diagnosis includes, but is not limited to, CVA, TIA, fall, fracture, contusion, subdural, SAH, uncontrolled hypertension  Patient's presentation is most consistent with acute illness / injury with system symptoms.   Cardiac monitor yes, no change in rhythm throughout ER course Medications given: Hydralazine  10 mg IV, Ativan 0.5 mg IV, morphine 4 mg IV, Zofran  4 mg IV, Plavix  75 mg p.o.  Imaging and labs ordered   CT of the head for TIA independently reviewed interpreted by me by reading radiologist report as unsure of whether this is an acute or remote infarct, recommends MRI  Consult to neuro, spoke with Dr. Deedra, recommends MRI of the brain.  Does not sound like a focal type stroke so defer CTA head and neck until MRI results.  If positive for acute event then will need to do CTA head and neck  CBC reassuring, urinalysis reassuring, troponin mildly elevated but most likely is due to her age Elizabeth of labs reassuring  CT of the head and C-spine, independently  reviewed interpreted by me as being positive for possible acute versus remote infarct  Consult to neurology, spoke with Dr. Voncile, recommends MRI of the brain, wait for CTA head and neck until confirmation of acute infarct  MRI of the brain, independently reviewed interpreted by me by reading radiologist reports and reviewing images, results are positive for acute infarct  Consult to neurology,  explained to Dr. Voncile that the infarct is acute.  Since she is on aspirin  he recommends Plavix  75 mg p.o.  Instructions for hospitalist to continue stroke orders with the 2D echo, labs etc.  Will add them to the secure chat with Dr.Arora for them to confirm orders.  I did discuss the findings with patient.  She is complaining of continued pain in the ribs.  Will go ahead and do morphine 4 mg IV, Zofran  4 mg IV due to her discomfort.  Explained to her she will need to have more CTs.  Also explained to her that this was an acute infarct and that she will need to be admitted to the hospital.  She is in agreement with this treatment plan.  Consult hospitalist for admission  Spoke with doctor Paudel, will admit patient, added him to secure chat with neuro  Dr Jossie in to see the patient also    FINAL CLINICAL IMPRESSION(S) / ED DIAGNOSES   Final diagnoses:  Acute CVA (cerebrovascular accident) [facility])  Fall, initial encounter  Multiple contusions     Rx / DC Orders   ED Discharge Orders     None        Note:  This document was prepared using Dragon voice recognition software and may include unintentional dictation errors.    Gasper Devere ORN, PA-C 11/05/24 1520    Bradler, Evan K, MD 11/06/24 1332

## 2024-11-05 NOTE — ED Notes (Signed)
 Responded to bathroom call bell to find tech with patient who is slumped back on the commode with her back against the wall. Tech stated that the patient had said she felt exhausted just prior to slumping back. Pt unable to move left arm or leg, left side of face noticibly drooping. This was a significant change from just prior to going to the bathroom. Pt was able to stand with assistance and pivot to the wheelchair at that time. This nurse and the tech had to lift the patient from the commode and place her in the wheelchair and again to get her into the stretcher. Admitting doctor paged and ordered a repeat CT. Pt placed back on monitor and new NIH performed.

## 2024-11-06 ENCOUNTER — Observation Stay: Admit: 2024-11-06 | Discharge: 2024-11-06 | Disposition: A | Attending: Hospitalist

## 2024-11-06 DIAGNOSIS — E785 Hyperlipidemia, unspecified: Secondary | ICD-10-CM

## 2024-11-06 DIAGNOSIS — I10 Essential (primary) hypertension: Secondary | ICD-10-CM | POA: Diagnosis not present

## 2024-11-06 DIAGNOSIS — R29702 NIHSS score 2: Secondary | ICD-10-CM

## 2024-11-06 DIAGNOSIS — I7090 Unspecified atherosclerosis: Secondary | ICD-10-CM | POA: Diagnosis not present

## 2024-11-06 DIAGNOSIS — I6389 Other cerebral infarction: Secondary | ICD-10-CM | POA: Diagnosis not present

## 2024-11-06 DIAGNOSIS — I639 Cerebral infarction, unspecified: Secondary | ICD-10-CM | POA: Diagnosis not present

## 2024-11-06 LAB — ECHOCARDIOGRAM COMPLETE
AR max vel: 2.33 cm2
AV Area VTI: 2.14 cm2
AV Area mean vel: 2.15 cm2
AV Mean grad: 3 mmHg
AV Peak grad: 6 mmHg
Ao pk vel: 1.22 m/s
Area-P 1/2: 3.42 cm2
Calc EF: 71.2 %
Height: 60 in
MV VTI: 2.75 cm2
S' Lateral: 2 cm
Single Plane A2C EF: 77.6 %
Single Plane A4C EF: 68.5 %
Weight: 2246.93 [oz_av]

## 2024-11-06 LAB — LIPID PANEL
Cholesterol: 214 mg/dL — ABNORMAL HIGH (ref 0–200)
HDL: 53 mg/dL (ref >40)
LDL Cholesterol: 132 mg/dL — ABNORMAL HIGH (ref 0–99)
Total CHOL/HDL Ratio: 4 ratio
Triglycerides: 143 mg/dL (ref <150)
VLDL: 29 mg/dL (ref 0–40)

## 2024-11-06 LAB — HEMOGLOBIN A1C
Hgb A1c MFr Bld: 5.3 % (ref 4.8–5.6)
Mean Plasma Glucose: 105.41 mg/dL

## 2024-11-06 MED ORDER — FENOFIBRATE 40 MG PO TABS: 80.0000 mg | ORAL_TABLET | Freq: Every day | ORAL | 0 refills | Status: DC

## 2024-11-06 MED ORDER — ASPIRIN 81 MG PO TBEC: 81.0000 mg | DELAYED_RELEASE_TABLET | Freq: Every day | ORAL | 0 refills | Status: DC

## 2024-11-06 MED ORDER — PERFLUTREN LIPID MICROSPHERE
1.0000 mL | INTRAVENOUS | Status: AC | PRN
Start: 2024-11-06 — End: 2024-11-06
  Administered 2024-11-06: 2 mL via INTRAVENOUS

## 2024-11-06 MED ORDER — ATORVASTATIN CALCIUM 80 MG PO TABS: 80.0000 mg | ORAL_TABLET | Freq: Every day | ORAL | 0 refills | Status: DC

## 2024-11-06 MED ORDER — ATORVASTATIN CALCIUM 20 MG PO TABS: 80.0000 mg | ORAL_TABLET | Freq: Every day | ORAL | Status: DC

## 2024-11-06 NOTE — ED Notes (Signed)
 Pt reports having acid reflux and states that she would like something for same.  Pt also reports having back pain and request apin medication for same but states she feels like she can't swallow large pills.

## 2024-11-06 NOTE — ED Notes (Signed)
 Pt is able to ambulate to the bathroom with assistance.

## 2024-11-06 NOTE — Evaluation (Signed)
 Occupational Therapy Evaluation Patient Details Name: Kristen Lloyd MRN: [id number] DOB: [birth year] Today's Date: 11/06/2024   History of Present Illness   Kristen Lloyd is a pleasant 83 y.o. female with medical history significant for TIA, cerebellar infarct, diverticulitis, CAD, hyperlipidemia, HTN, osteoarthritis, hip replacement, osteopenia who presented to ED complaining of fall. MRI of the brain showed 4 mm focus of acute infarct within the right temporal periventricular white matter.   Clinical Impressions Kristen Lloyd was seen for OT evaluation this date. Prior to hospital admission, pt was MOD I using SPC. Pt lives with her 94 yo spouse. Pt currently requires MIN A + HHA for toilet t/f, SUPERVISION seated pericare. CGA standing hand washing. Demonstrated mild LUE weakness. Pt would benefit from skilled OT to address noted impairments and functional limitations (see below for any additional details). Upon hospital discharge, recommend OT follow up.     If plan is discharge home, recommend the following:   A little help with walking and/or transfers;A little help with bathing/dressing/bathroom;Help with stairs or ramp for entrance     Functional Status Assessment   Patient has had a recent decline in their functional status and demonstrates the ability to make significant improvements in function in a reasonable and predictable amount of time.     Equipment Recommendations   None recommended by OT     Recommendations for Other Services         Precautions/Restrictions   Precautions Precautions: Fall Recall of Precautions/Restrictions: Intact Restrictions Weight Bearing Restrictions Per Provider Order: No     Mobility Bed Mobility Overal bed mobility: Needs Assistance Bed Mobility: Supine to Sit     Supine to sit: Supervision          Transfers Overall transfer level: Needs assistance Equipment used: 1 person hand held assist Transfers: Sit to/from  Stand Sit to Stand: Contact guard assist                  Balance Overall balance assessment: Needs assistance Sitting-balance support: No upper extremity supported, Feet supported Sitting balance-Leahy Scale: Good     Standing balance support: No upper extremity supported, During functional activity Standing balance-Leahy Scale: Poor                             ADL either performed or assessed with clinical judgement   ADL Overall ADL's : Needs assistance/impaired                                       General ADL Comments: MIN A + HHA for toilet t/f, SUPERVISION seated pericare.      Pertinent Vitals/Pain Pain Assessment Pain Assessment: Faces Faces Pain Scale: Hurts little more Pain Location: L hip Pain Descriptors / Indicators: Aching, Discomfort Pain Intervention(s): Limited activity within patient's tolerance     Extremity/Trunk Assessment Upper Extremity Assessment Upper Extremity Assessment: LUE deficits/detail;Right hand dominant LUE Deficits / Details: grossly 4-/5   Lower Extremity Assessment Lower Extremity Assessment: Generalized weakness       Communication Communication Communication: No apparent difficulties   Cognition Arousal: Alert Behavior During Therapy: WFL for tasks assessed/performed Cognition: No apparent impairments             OT - Cognition Comments: mild word finding difficulties  Following commands: Intact       Cueing  General Comments   Cueing Techniques: Verbal cues      Exercises     Shoulder Instructions      Home Living Family/patient expects to be discharged to:: Private residence Living Arrangements: Spouse/significant other Available Help at Discharge: Family Type of Home: House Home Access: Stairs to enter Secretary/administrator of Steps: 3 Entrance Stairs-Rails: Can reach both Home Layout: One level               Home Equipment: Cane -  single point   Additional Comments: spouse is 65      Prior Functioning/Environment Prior Level of Function : Independent/Modified Independent             Mobility Comments: using cane in the community ADLs Comments: has not been driving recently    OT Problem List: Decreased strength;Decreased range of motion;Decreased activity tolerance;Impaired balance (sitting and/or standing);Decreased safety awareness   OT Treatment/Interventions: Self-care/ADL training;Therapeutic exercise;Energy conservation;DME and/or AE instruction;Therapeutic activities;Balance training;Patient/family education      OT Goals(Current goals can be found in the care plan section)   Acute Rehab OT Goals Patient Stated Goal: to go home OT Goal Formulation: With patient/family Time For Goal Achievement: 11/20/24 Potential to Achieve Goals: Good ADL Goals Pt Will Perform Grooming: with modified independence;standing Pt Will Perform Lower Body Dressing: with modified independence;sit to/from stand Pt Will Transfer to Toilet: with modified independence;ambulating;regular height toilet   OT Frequency:  Min 3X/week    Co-evaluation              AM-PAC OT 6 Clicks Daily Activity     Outcome Measure Help from another person eating meals?: None Help from another person taking care of personal grooming?: None Help from another person toileting, which includes using toliet, bedpan, or urinal?: A Little Help from another person bathing (including washing, rinsing, drying)?: A Little Help from another person to put on and taking off regular upper body clothing?: None Help from another person to put on and taking off regular lower body clothing?: A Little 6 Click Score: 21   End of Session Equipment Utilized During Treatment: Gait belt Nurse Communication: Mobility status  Activity Tolerance: Patient tolerated treatment well Patient left: in chair;with call bell/phone within reach;with  family/visitor present  OT Visit Diagnosis: Other abnormalities of gait and mobility (R26.89);Muscle weakness (generalized) (M62.81)                Time: 1001-1030 OT Time Calculation (min): 29 min Charges:  OT General Charges $OT Visit: 1 Visit OT Evaluation $OT Eval Low Complexity: 1 Low OT Treatments $Self Care/Home Management : 8-22 mins  Elston Slot, M.S. OTR/L  11/06/24, 12:37 PM  ascom [phone]

## 2024-11-06 NOTE — Discharge Summary (Signed)
 Physician Discharge Summary  Patient: Kristen Lloyd FMW:[id number] DOB: December 11, 1941   Code Status: Full Code Admit date: 11/05/2024 Discharge date: 11/06/2024 Disposition: Home, No home health services recommended PCP: Epifanio Alm SQUIBB, MD  Recommendations for Outpatient Follow-up:  Follow up with PCP within 1-2 weeks Regarding general hospital follow up and preventative care Recommend discussing cholesterol treatment and stroke prevention Follow up with neurology in 8-10 weeks  Discharge Diagnoses:  Principal Problem:   Acute stroke due to ischemia [facility]) Active Problems:   Unstable angina (HCC)   HTN (hypertension)   Hyperlipidemia  Brief Hospital Course Summary: Kristen Lloyd is a 83 y.o. female with a PMH significant for TIA, cerebellar infarct, diverticulitis, CAD, hyperlipidemia, HTN, osteoarthritis, hip replacement, osteopenia who presented to ED complaining of fall when she was trying to bend down to pick something up from the chair.  She stated that she was able to get up but was unsteady on her feet.  She was dizzy.    ED Course: vitals were stable.  Head CT showed Advanced chronic cerebellar ischemia, And cannot exclude acute or subacute ischemia in the right cerebellum. No other acute intracranial abnormality. No acute traumatic injury identified. Upon further suspicion of stroke being the cause of her symptoms. A brain MRI and CT angio head/neck were completed which showed: 4 mm focus of acute infarct within the right temporal periventricular white matter as well as several remote infarcts.  Neurology was consulted and recommended an echo, which was mostly unremarkable.  They instructed patient to treat with aspirin  and high-dose cholesterol treatment.  Patient indicated that she would not be adherent with cholesterol medication.  She was also recommended to have a 30 day cardiac event monitor which was placed by cardiology prior to dc.  She will follow up with  neurology in 8-12 weeks.  PT/OT evaluated and recommended HH therapy.   All other chronic conditions were treated with home medications.    Discharge Condition: Good, improved Recommended discharge diet: Regular healthy diet  Consultations: Neurology  Procedures/Studies: None  Allergies as of 11/06/2024       Reactions   Flagyl [metronidazole]    Levofloxacin    Moxifloxacin    Septra [sulfamethoxazole-trimethoprim] Hives        Medication List     STOP taking these medications    docusate sodium  50 MG capsule Commonly known as: COLACE   famotidine  20 MG tablet Commonly known as: PEPCID    omeprazole  40 MG capsule Commonly known as: PRILOSEC       TAKE these medications    aspirin  EC 81 MG tablet Take 1 tablet (81 mg total) by mouth daily. Swallow whole. Start taking on: November 07, 2024   atorvastatin 80 MG tablet Commonly known as: LIPITOR Take 1 tablet (80 mg total) by mouth daily.   cholecalciferol 1000 units tablet Commonly known as: VITAMIN D Take 1,000 Units by mouth daily.   diclofenac Sodium 1 % Gel Commonly known as: VOLTAREN Apply 2 g topically in the morning and at bedtime.   Fenofibrate  40 MG Tabs Take 2 tablets (80 mg total) by mouth daily. What changed:  medication strength how much to take   fluticasone  50 MCG/ACT nasal spray Commonly known as: FLONASE  Place 1 spray into both nostrils 2 (two) times daily.   traMADol  50 MG tablet Commonly known as: ULTRAM  Take 1 tablet (50 mg total) by mouth every 4 (four) hours as needed for moderate pain.  Follow-up Information     [facility] Neurology. Schedule an appointment as soon as possible for a visit in 8 week(s).                 Subjective   Pt reports having improvement in her dizziness. Has not been ambulating far since presentation. She states that she will not be taking cholesterol medication because she remembers her mom having weakness with an unknown  cholesterol medication.  We had an extended discussion with husband and daughter at bedside addressing this and recommending to take.   All questions and concerns were addressed at time of discharge.  Objective  Blood pressure 136/70, pulse 70, temperature 98.1 F (36.7 C), temperature source Oral, resp. rate 17, height 5' (1.524 m), weight 63.7 kg, SpO2 97%.   General: Pt is alert, awake, not in acute distress Cardiovascular: RRR, S1/S2 +, no rubs, no gallops Respiratory: CTA bilaterally, no wheezing, no rhonchi Abdominal: Soft, NT, ND, bowel sounds + Extremities: no edema, no cyanosis  The results of significant diagnostics from this hospitalization (including imaging, microbiology, ancillary and laboratory) are listed below for reference.   Imaging studies: ECHOCARDIOGRAM COMPLETE Result Date: 11/06/2024    ECHOCARDIOGRAM REPORT   Patient Name:   Kristen Lloyd Date of Exam: 11/06/2024 Medical Rec #:  [id number]      Height:       60.0 in Accession #:    [id number]     Weight:       140.4 lb Date of Birth:  March 14, 1941      BSA:          1.606 m Patient Age:    83 years       BP:           141/51 mmHg Patient Gender: F              HR:           72 bpm. Exam Location:  [facility] Procedure: 2D Echo, Cardiac Doppler, Color Doppler and Intracardiac            Opacification Agent (Both Spectral and Color Flow Doppler were            utilized during procedure). Indications:     Stroke I63.9  History:         Patient has no prior history of Echocardiogram examinations.                  Stroke.  Sonographer:     Ashley McNeely-Sloane Referring Phys:  [id number] [facility] PAUDEL Diagnosing Phys: Marsa Dooms MD IMPRESSIONS  1. Left ventricular ejection fraction, by estimation, is 55 to 60%. The left ventricle has normal function. The left ventricle has no regional wall motion abnormalities. Left ventricular diastolic parameters are consistent with Grade I diastolic dysfunction (impaired relaxation).  2.  Right ventricular systolic function is normal. The right ventricular size is normal.  3. The mitral valve is normal in structure. Mild mitral valve regurgitation. No evidence of mitral stenosis.  4. The aortic valve is normal in structure. Aortic valve regurgitation is not visualized. No aortic stenosis is present.  5. The inferior vena cava is normal in size with greater than 50% respiratory variability, suggesting right atrial pressure of 3 mmHg. FINDINGS  Left Ventricle: Left ventricular ejection fraction, by estimation, is 55 to 60%. The left ventricle has normal function. The left ventricle has no regional wall motion abnormalities. Definity contrast agent was given IV to delineate the  left ventricular  endocardial borders. Strain was performed and the global longitudinal strain is indeterminate. The left ventricular internal cavity size was normal in size. There is no left ventricular hypertrophy. Left ventricular diastolic parameters are consistent with Grade I diastolic dysfunction (impaired relaxation). Right Ventricle: The right ventricular size is normal. No increase in right ventricular wall thickness. Right ventricular systolic function is normal. Left Atrium: Left atrial size was normal in size. Right Atrium: Right atrial size was normal in size. Pericardium: There is no evidence of pericardial effusion. Mitral Valve: The mitral valve is normal in structure. Mild mitral valve regurgitation. No evidence of mitral valve stenosis. MV peak gradient, 5.5 mmHg. The mean mitral valve gradient is 3.0 mmHg. Tricuspid Valve: The tricuspid valve is normal in structure. Tricuspid valve regurgitation is mild . No evidence of tricuspid stenosis. Aortic Valve: The aortic valve is normal in structure. Aortic valve regurgitation is not visualized. No aortic stenosis is present. Aortic valve mean gradient measures 3.0 mmHg. Aortic valve peak gradient measures 6.0 mmHg. Aortic valve area, by VTI measures 2.14 cm.  Pulmonic Valve: The pulmonic valve was normal in structure. Pulmonic valve regurgitation is not visualized. No evidence of pulmonic stenosis. Aorta: The aortic root is normal in size and structure. Venous: The inferior vena cava is normal in size with greater than 50% respiratory variability, suggesting right atrial pressure of 3 mmHg. IAS/Shunts: No atrial level shunt detected by color flow Doppler. Additional Comments: 3D was performed not requiring image post processing on an independent workstation and was indeterminate.  LEFT VENTRICLE PLAX 2D LVIDd:         4.20 cm     Diastology LVIDs:         2.00 cm     LV e' medial:    5.33 cm/s LV PW:         1.20 cm     LV E/e' medial:  12.6 LV IVS:        1.00 cm     LV e' lateral:   5.55 cm/s LVOT diam:     1.70 cm     LV E/e' lateral: 12.1 LV SV:         59 LV SV Index:   37 LVOT Area:     2.27 cm  LV Volumes (MOD) LV vol d, MOD A2C: 62.3 ml LV vol d, MOD A4C: 63.8 ml LV vol s, MOD A2C: 13.9 ml LV vol s, MOD A4C: 20.1 ml LV SV MOD A2C:     48.3 ml LV SV MOD A4C:     63.8 ml LV SV MOD BP:      46.5 ml RIGHT VENTRICLE             IVC RV S prime:     12.70 cm/s  IVC diam: 1.55 cm LEFT ATRIUM         Index LA diam:    3.30 cm 2.05 cm/m  AORTIC VALVE                    PULMONIC VALVE AV Area (Vmax):    2.33 cm     PV Vmax:        0.99 m/s AV Area (Vmean):   2.15 cm     PV Vmean:       67.600 cm/s AV Area (VTI):     2.14 cm     PV VTI:         0.213 m AV Vmax:  122.00 cm/s  PV Peak grad:   3.9 mmHg AV Vmean:          84.500 cm/s  PV Mean grad:   2.0 mmHg AV VTI:            0.275 m      RVOT Peak grad: 1 mmHg AV Peak Grad:      6.0 mmHg AV Mean Grad:      3.0 mmHg LVOT Vmax:         125.00 cm/s LVOT Vmean:        80.200 cm/s LVOT VTI:          0.259 m LVOT/AV VTI ratio: 0.94  AORTA Ao Root diam: 3.00 cm Ao Asc diam:  2.50 cm MITRAL VALVE MV Area (PHT): 3.42 cm    SHUNTS MV Area VTI:   2.75 cm    Systemic VTI:  0.26 m MV Peak grad:  5.5 mmHg    Systemic Diam:  1.70 cm MV Mean grad:  3.0 mmHg    Pulmonic VTI:  0.130 m MV Vmax:       1.17 m/s MV Vmean:      78.2 cm/s MV Decel Time: 222 msec MV E velocity: 67.30 cm/s MV A velocity: 94.70 cm/s MV E/A ratio:  0.71 Marsa Dooms MD Electronically signed by Marsa Dooms MD Signature Date/Time: 11/06/2024/9:43:23 AM    Final    CT ANGIO HEAD NECK W WO CM Result Date: 11/05/2024 EXAM: CTA HEAD AND NECK WITHOUT AND WITH 11/05/2024 06:10:09 PM TECHNIQUE: CTA of the head and neck was performed without and with the administration of 75 mL of iohexol  (OMNIPAQUE ) 350 MG/ML injection. Multiplanar 2D and/or 3D reformatted images are provided for review. Automated exposure control, iterative reconstruction, and/or weight based adjustment of the mA/kV was utilized to reduce the radiation dose to as low as reasonably achievable. Stenosis of the internal carotid arteries measured using NASCET criteria. COMPARISON: Same day CT head and MRI head. CLINICAL HISTORY: Neuro deficit, acute, stroke suspected. FINDINGS: CTA NECK: AORTIC ARCH AND ARCH VESSELS: Mild to moderate atherosclerosis of the visualized aortic arch. Atherosclerosis of the proximal right subclavian artery without significant stenosis. Limited evaluation of the proximal left subclavian artery due to streak artifact from dense venous contrast. No dissection or arterial injury. CERVICAL CAROTID ARTERIES: The right carotid artery is patent from the origin to the skull base. Mild tortuosity of the proximal right common carotid artery. Atherosclerosis at the right carotid bifurcation without hemodynamically significant stenosis. The left carotid artery is patent from the origin to the skull base. Atherosclerosis at the left carotid bifurcation without hemodynamically significant stenosis. No dissection or arterial injury. CERVICAL VERTEBRAL ARTERIES: Moderate to severe stenosis at the origin of the right vertebral artery. The vertebral arteries are relatively  diminished in caliber but are patent from the origins to the V3 segments. The right vertebral artery primarily terminates in a muscular branch at the level of C1. There is a diminutive branch from the distal right V3 segment which courses intracranially. The left vertebral artery terminates at the origin of the left PICA. No dissection or arterial injury. LUNGS AND MEDIASTINUM: Unremarkable. SOFT TISSUES: Nonspecific focus in the left vallecula measuring up to 6 mm which may reflect a mucous retention cyst. Recommend nonemergent correlation with direct visualization. Possible additional nodular focus in the right vallecula abutting the right pharyngoepiglottic fold. BONES: Prominent degenerative changes at the atlantodens and atlantoaxial articulations. Degenerative soft tissue along the dorsal aspect of the dens.  There is mild basilar invagination noted likely related to degenerative changes. Additional degenerative changes throughout the cervical spine and upper thoracic spine with severe disc space narrowing at multiple levels. Periapical lucencies of multiple maxillary teeth. CTA HEAD: ANTERIOR CIRCULATION: Intracranial internal carotid arteries are patent bilaterally. Atherosclerosis of the carotid siphons resulting in mild stenosis bilaterally. The anterior cerebral arteries are patent bilaterally. The middle cerebral arteries are patent bilaterally. There is mild irregularity and narrowing of the proximal M1 segment of the left MCA. No aneurysm. POSTERIOR CIRCULATION: The basilar artery appears occluded proximally with reconstitution near the level of the pontomedullary junction. The basilar artery is small in caliber and irregular with multifocal narrowing. The left PICA is supplied via the left vertebral artery. The right PICA is diminutive in caliber, appears patent, and is likely reconstituted via collaterals. The superior cerebellar arteries are patent bilaterally. Posterior communicating arteries noted  bilaterally. Fetal origin of the left PCA. The right PCA is primarily supplied via the right posterior communicating artery with a small P1 segment also noted. There is mild to moderate narrowing of the P2 segment of the left PCA. 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 No aneurysm. OTHER: No acute intracranial hemorrhage. Redemonstration of numerous chronic infarcts within the bilateral cerebellum slightly more pronounced on the right. Chronic microvascular ischemic changes. Remote cortical infarcts in the frontal lobes better evaluated on same day MRI brain. Atherosclerosis at the skull base. No midline shift. The basilar cisterns are patent. Mucosal thickening in the right sphenoid sinus. No dural venous sinus thrombosis on this non-dedicated study. IMPRESSION: 1. No evidence of acute large vessel occlusion. 2. 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 3. Atherosclerosis at the skull base and carotid bifurcations without hemodynamically significant stenosis. 4. Moderate to severe stenosis at the origin of the right vertebral artery. 5. Prominent degenerative changes at the atlantodens and atlantoaxial articulations with degenerative soft tissue along the dorsal dens and mild basilar invagination. 6. Nonspecific focus in the left vallecula measuring up to 6 mm which may reflect a mucous retention cyst. Recommend nonemergent correlation with direct visualization. Electronically signed by: Donnice Mania MD 11/05/2024 06:51 PM EST RP Workstation: [id number]   MR BRAIN WO CONTRAST Result Date: 11/05/2024 EXAM: MRI BRAIN WITHOUT CONTRAST 11/05/2024 02:06:28 PM TECHNIQUE: Multiplanar multisequence MRI of the head/brain was performed  without the administration of intravenous contrast. COMPARISON: Same day CT head. CLINICAL HISTORY: Transient ischemic attack (TIA). FINDINGS: BRAIN AND VENTRICLES: There is a 4 mm focus of acute infarct within the right temporal periventricular white matter. Multiple remote infarcts are present in the bilateral cerebellum, more pronounced on the right. Remote cortical infarcts are noted in the bilateral frontal lobes. Susceptibility along the sulci in the right occipital lobe is suggestive of prior subarachnoid hemorrhage. A remote lacunar infarct is present in the left thalamus. A remote infarct is seen in the left corona radiata. Scattered supratentorial white matter signal abnormalities are suggestive of mild chronic microvascular ischemic changes. No acute intracranial hemorrhage. No mass. No midline shift. No hydrocephalus. The sella is unremarkable. Normal flow voids. ORBITS: No acute abnormality. SINUSES AND MASTOIDS: Mild mucosal thickening in the right sphenoid sinus. Trace fluid in the right mastoid tip. BONES AND SOFT TISSUES: Normal marrow signal. No acute soft tissue abnormality. Changes in  the visualized upper cervical spine: Disc osteophyte complex at C3-C4 likely contributing to spinal canal narrowing. VISUALIZED LUNGS: Changes of the right and left pleural effusions are not obvious. IMPRESSION: 1. 4 mm focus of acute infarct within the right temporal periventricular white matter. 2. Multiple remote infarcts in the bilateral cerebellum, more pronounced on the right. 3. Remote cortical infarcts in the bilateral frontal lobes. Remote lacunar infarct in the left thalamus and the left corona radiata. 4. Susceptibility along the sulci in the right occipital lobe suggestive of prior subarachnoid hemorrhage. 5. Mild chronic microvascular ischemic changes. 6. Disc osteophyte complex at C3-C4 likely contributing to spinal canal narrowing. Electronically signed by: Donnice Mania MD 11/05/2024 02:44 PM EST RP  Workstation: [id number]   DG Knee Complete 4 Views Left Result Date: 11/05/2024 CLINICAL DATA:  Left knee pain after a fall. EXAM: LEFT KNEE - COMPLETE 4+ VIEW COMPARISON:  None Available. FINDINGS: No joint effusion or fracture. Tricompartment osteophytosis. Medial joint space narrowing. IMPRESSION: 1. No acute findings. 2. Tricompartment osteoarthritis. Electronically Signed   By: Newell Eke M.D.   On: 11/05/2024 11:43   DG Hip Unilat W or Wo Pelvis 2-3 Views Left Result Date: 11/05/2024 CLINICAL DATA:  Fall with left hip pain. EXAM: DG HIP (WITH OR WITHOUT PELVIS) 2-3V LEFT COMPARISON:  None Available. FINDINGS: Left hip arthroplasty. No fracture or dislocation. Osteopenia. Degenerative changes in the spine. IMPRESSION: No acute findings. Electronically Signed   By: Newell Eke M.D.   On: 11/05/2024 11:38   DG Ribs Unilateral W/Chest Right Result Date: 11/05/2024 CLINICAL DATA:  Fall, right upper anterior rib pain. EXAM: RIGHT RIBS AND CHEST - 3+ VIEW COMPARISON:  10/27/2013. FINDINGS: Frontal view of the chest shows midline trachea. Heart size normal. Thoracic aorta is calcified. Lungs are mildly hyperinflated but clear. No pleural fluid. No pneumothorax. Dedicated views of the right ribs show no definite acute fracture. IMPRESSION: No acute findings. Electronically Signed   By: Newell Eke M.D.   On: 11/05/2024 11:37   CT Cervical Spine Wo Contrast Result Date: 11/05/2024 EXAM: CT CERVICAL SPINE WITHOUT CONTRAST 11/05/2024 11:02:00 AM TECHNIQUE: CT of the cervical spine was performed without the administration of intravenous contrast. Multiplanar reformatted images are provided for review. Automated exposure control, iterative reconstruction, and/or weight based adjustment of the mA/kV was utilized to reduce the radiation dose to as low as reasonably achievable. COMPARISON: Head CT today reported separately. CLINICAL HISTORY: Neck trauma (Age >= 65y). FINDINGS: CERVICAL SPINE: BONES  AND ALIGNMENT: No acute fracture or traumatic malalignment. DEGENERATIVE CHANGES: Chronic severe cervical spine degeneration and degenerative appearing ankylosis of the C2-C3, C4 through C6 cervical levels. Similar developing ankylosis in the lower cervical spine, and in the visible upper thoracic spine. Chronic severe C1-C2 degeneration superimposed with complete bilateral C1-C2 joint space loss, bulky osteophytosis, and sclerosis. Associated bulky ligamentous hypertrophy about the odontoid. Subsequent mild spinal stenosis at the cervicomedullary junction and C1 level. SOFT TISSUES: No prevertebral soft tissue swelling. Calcified cervical carotid atherosclerosis. Partially visible aortic arch calcified atherosclerosis. Otherwise negative visible non-contrast thoracic inlet. IMPRESSION: 1. No acute traumatic injury identified in the cervical spine. 2. 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 Very severe C1-C2 degeneration with spinal stenosis (probably mild) at the cervicomedullary junction and C1 level. Electronically signed by: Helayne Hurst MD 11/05/2024 11:14 AM EST RP Workstation: [id number]   CT HEAD WO CONTRAST ( ) Result Date: 11/05/2024 EXAM: CT HEAD WITHOUT CONTRAST 11/05/2024 11:02:00 AM TECHNIQUE: CT of the head  was performed without the administration of intravenous contrast. Automated exposure control, iterative reconstruction, and/or weight based adjustment of the mA/kV was utilized to reduce the radiation dose to as low as reasonably achievable. COMPARISON: None available. CLINICAL HISTORY: 83 year old female status post fall, dizziness, T1. FINDINGS: BRAIN AND VENTRICLES: Brain volume within normal limits for age. Mix of circumscribed and indistinct hypodensity in the right cerebellum, in part chronic right SCA territory infarct with encephalomalacia, but difficult to exclude superimposed acute or subacute cerebellar infarct on that side.  Contralateral small but circumscribed left cerebellar infarcts appear chronic. Supratentorial gray white differentiation better maintained. Mild for age cerebral white matter hypodensity. Extensive calcified atherosclerosis at the skull base. No suspicious intracranial vascular hyperdensity. No acute hemorrhage. No hydrocephalus. No extra-axial collection. No mass effect or midline shift. ORBITS: No acute abnormality. No orbital scalp soft tissue injury identified. SINUSES: Chronic right sphenoid sinus mucoperiosteal thickening. Other visible paranasal sinuses, mastoids, and milia are well aerated. SOFT TISSUES AND SKULL: Left vertex scalp sebaceous type cyst. No acute soft tissue abnormality. No skull fracture. Partially visible advanced cervical spine degeneration. IMPRESSION: 1. Advanced chronic cerebellar ischemia, And cannot exclude acute or subacute ischemia in the right cerebellum. 2. No other acute intracranial abnormality. No acute traumatic injury identified. Electronically signed by: Helayne Hurst MD 11/05/2024 11:11 AM EST RP Workstation: [id number]    Labs: Basic Metabolic Panel: Recent Labs  Lab 11/05/24 1306 11/05/24 2215  NA 145  --   K 3.6  --   CL 106  --   CO2 22  --   GLUCOSE 104*  --   BUN 12  --   CREATININE 0.81 0.71  CALCIUM 9.2  --    CBC: Recent Labs  Lab 11/05/24 0954 11/05/24 2215  WBC 4.4 7.0  NEUTROABS 2.3  --   HGB 13.4 13.4  HCT 40.5 40.5  MCV 101.0* 101.3*  PLT 189 174   Microbiology: Results for orders placed or performed during the hospital encounter of 05/06/23  Surgical pcr screen     Status: Abnormal   Collection Time: 05/06/23  1:30 PM   Specimen: Nasal Mucosa; Nasal Swab  Result Value Ref Range Status   MRSA, PCR NEGATIVE NEGATIVE Final   Staphylococcus aureus POSITIVE (A) NEGATIVE Final    Comment: (NOTE) The Xpert SA Assay (FDA approved for NASAL specimens in patients 33 years of age and older), is one component of a  comprehensive surveillance program. It is not intended to diagnose infection nor to guide or monitor treatment. Performed at [facility], [address] [place], [place] [address]     Time coordinating discharge: Over 30 minutes  Marien LITTIE Piety, MD  [facility] 11/06/2024, 11:52 AM

## 2024-11-06 NOTE — Discharge Instructions (Signed)
 You will receive a heart monitor before leaving.  Follow up with neurology in 8-10 weeks.  Please see your PCP in 1-2 weeks for hospital follow up.  Take aspirin  and atovastatin every day to help prevent stroke.  Also try to increase your activity level and follow a healthy diet. More information included in packet

## 2024-11-06 NOTE — Evaluation (Signed)
 Speech Language Pathology Evaluation Patient Details Name: [name] MRN: [id number] DOB: [birth year] Today's Date: 11/06/2024 Time: 9154-9074 SLP Time Calculation (min) (ACUTE ONLY): 40 min  Problem List:  Patient Active Problem List   Diagnosis Date Noted   Acute stroke due to ischemia (HCC) 11/05/2024   Iron deficiency anemia 08/04/2023   Osteoarthritis 05/13/2023   Hx of total hip arthroplasty, 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   Rotator cuff tendinitis, right 04/21/2022   Nontraumatic incomplete tear of left rotator cuff 03/26/2022   Primary osteoarthritis of left shoulder 03/26/2022   Rotator cuff tendinitis, left 03/26/2022   Ileostomy in place [facility]) 06/03/2020   Diverticulitis of large intestine with abscess without bleeding 12/25/2019   Unstable angina (HCC) 01/19/2017   CAD (coronary artery disease) 01/19/2017   Globus sensation 03/08/2016   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   HTN (hypertension) 06/03/2014   Hyperlipidemia 06/03/2014   Palpitation 06/03/2014   TIA (transient ischemic attack) 06/03/2014   Presence of coronary angioplasty implant and graft 12/27/2009   Past Medical History:  Past Medical History:  Diagnosis Date   Angina pectoris    Aortic atherosclerosis    Arthritis    Atrophic vaginitis    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   GERD (gastroesophageal reflux disease)    HTN (hypertension)    Hyperlipemia    Osteopenia    Palpitations    Primary osteoarthritis of left hip    Shingles    Spastic colon    TIA (transient ischemic attack)    Past Surgical History:  Past Surgical History:  Procedure Laterality Date   ABDOMINAL HYSTERECTOMY     APPENDECTOMY     BREAST BIOPSY  Left 1990's   lt bx x 2-neg   CARDIAC CATHETERIZATION N/A 01/19/2017   Procedure: Left Heart Cath and Coronary Angiography;  Surgeon: Vinie DELENA Jude, MD;  Location: [facility] INVASIVE CV LAB;  Service: Cardiovascular;  Laterality: N/A;   CARDIAC CATHETERIZATION N/A 01/19/2017   Procedure: Coronary Stent Intervention;  Surgeon: Marsa Dooms, MD;  Location: [facility] INVASIVE CV LAB;  Service: Cardiovascular;  Laterality: N/A;   Ileostomy and reversal N/A 2021   TONSILLECTOMY     TOTAL HIP ARTHROPLASTY Left 05/13/2023   Procedure: TOTAL HIP ARTHROPLASTY;  Surgeon: Mardee Lynwood SQUIBB, MD;  Location: [facility] ORS;  Service: Orthopedics;  Laterality: Left;   HPI:  Kristen Lloyd is a pleasant 83 y.o. female with medical history significant for TIA, cerebellar infarct, diverticulitis, CAD, hyperlipidemia, HTN, osteoarthritis, hip replacement, osteopenia who presented to ED complaining of fall when she was trying to bend down to pick something up from the chair. MRI:  4 mm focus of acute infarct within the right temporal periventricular white  matter.  2. Multiple remote infarcts in the bilateral cerebellum, more pronounced on the  right.  3. Remote cortical infarcts in the bilateral frontal lobes. Remote lacunar  infarct in the left thalamus and the left corona radiata.  4. Susceptibility along the sulci in the right occipital lobe suggestive of  prior subarachnoid hemorrhage.  5. Mild chronic microvascular ischemic changes.  6. Disc osteophyte complex at C3-C4 likely contributing to spinal canal  narrowing.   Assessment / Plan / Recommendation Clinical Impression  Pt seen for cognitive linguistic evaluation in the setting of acute CVA (MRI revealing multiple infarcts of various acuity). Assessment consisting of pt/family Marletta) interview and completion of dynamic assessment. Pt presents with independent orientation, attention, functional recall, receptive language, and visual processing across  assessment. Motor  speech grossly intact. Pt reporting delayed word finding for undetermined amount of time, with pt suspecting that deficits were related to aging. Single instance of anomia noted at conversational level (OT reporting ~2 instances of anomia during OT eval). No overt naming deficits when challenged with items in room. Pt able to make needs and concerns known in fluent, clear, precise speech.  Education shared regarding impact of fatigue, frustration, and language demand on expressive language and potential benefit from follow up SLP services at next level of care if pt desires. Given gross independence across cognitive communication domains and min noted expressive language deficits, no further acute SLP services indicated at this time.      SLP Assessment  SLP Recommendation/Assessment: All further Speech Language Pathology needs can be addressed in the next venue of care SLP Visit Diagnosis: Aphasia (R47.01) (?min aphasia - acute vs subacute)     Assistance Recommended at Discharge  Set up Supervision/Assistance  Functional Status Assessment Patient has had a recent decline in their functional status and demonstrates the ability to make significant improvements in function in a reasonable and predictable amount of time. (pt reports some change in expressive language)        SLP Evaluation Cognition  Overall Cognitive Status: Difficult to assess Arousal/Alertness: Awake/alert Orientation Level: Oriented X4 Attention: Focused;Sustained Focused Attention: Appears intact Sustained Attention: Appears intact Memory: Appears intact (functional recall for events of hospital) Awareness: Appears intact Problem Solving: Appears intact Executive Function: Decision Making;Initiating Decision Making: Appears intact Initiating: Appears intact Behaviors:  (frustrated by leads/BP monitor) Safety/Judgment: Appears intact       Comprehension  Auditory Comprehension Overall Auditory Comprehension:  Appears within functional limits for tasks assessed Yes/No Questions: Within Functional Limits Commands: Not tested Conversation: Complex Visual Recognition/Discrimination Discrimination: Within Function Limits Reading Comprehension Reading Status: Not tested    Expression Expression Primary Mode of Expression: Verbal Verbal Expression Overall Verbal Expression: Impaired at baseline (pt unsure of timeline for language concerns- suspected aging related) Initiation: No impairment Automatic Speech: Name;Social Response Level of Generative/Spontaneous Verbalization: Conversation Repetition: No impairment Naming: No impairment Pragmatics: No impairment Written Expression Written Expression: Not tested   Oral / Motor  Oral Motor/Sensory Function Overall Oral Motor/Sensory Function: Within functional limits Motor Speech Overall Motor Speech: Appears within functional limits for tasks assessed           Kassius Battiste Clapp, MS, CCC-SLP Speech Language Pathologist Rehab Services; [facility] [phone] (ascom)   Merrick Feutz J Clapp 11/06/2024, 11:12 AM

## 2024-11-06 NOTE — Consult Note (Addendum)
 NEUROLOGY CONSULT NOTE   Date of service: November 06, 2024 Patient Name: Kristen Lloyd MRN:  [id number] DOB:  31-Jan-1941 Chief Complaint: Fall Requesting Provider: Lenon Marien CROME, MD  History of Present Illness  Kristen Lloyd is a 83 y.o. female with hx of prior cerebellar infarcts, CAD, hypertension, hyperlipidemia,  osteopenia presented to the ED after having a fall when she bent down to pick up her bottle that she was reading.  She does not report focal or unilateral deficits but reports that she was generally weak and could not get off the floor. She was complaining of pain in her chest wall after the fall and was noticed to have bruising on her chest wall and underneath her breast bilaterally. She had a head CT done which showed age-indeterminate cerebellar infarcts and there was no comparative services available.  An MRI was done to get a better picture of if these were acute or chronic.  The cerebellar infarcts are chronic based on the MRI but there was a new area of stroke seen in the right temporal periventricular white matter. She was admitted for neurological consultation and further workup. She does not report any current headache.  Denies any shortness of breath but reports chest pain around the area where she injured herself. Denies any focal tingling numbness or weakness. Reports balance issues since her hip surgery.  LKW: Sometime in the morning of 11/05/2024-exact time unclear Modified rankin score: 3-Moderate disability-requires help but walks WITHOUT assistance IV Thrombolysis: Symptoms resolved at the time of presentation, MRI finding of the acute stroke likely an incidental finding EVT: Same as above NIHSS components Score: Comment  1a Level of Conscious 0[x]  1[]  2[]  3[]      1b LOC Questions 0[x]  1[]  2[]       1c LOC Commands 0[x]  1[]  2[]       2 Best Gaze 0[x]  1[]  2[]       3 Visual 0[x]  1[]  2[]  3[]      4 Facial Palsy 0[x]  1[]  2[]  3[]      5a Motor Arm - left  0[x]  1[]  2[]  3[]  4[]  UN[]    5b Motor Arm - Right 0[x]  1[]  2[]  3[]  4[]  UN[]    6a Motor Leg - Left 0[]  1[x]  2[]  3[]  4[]  UN[]    6b Motor Leg - Right 0[]  1[x]  2[]  3[]  4[]  UN[]    7 Limb Ataxia 0[x]  1[]  2[]  UN[]      8 Sensory 0[x]  1[]  2[]  UN[]      9 Best Language 0[x]  1[]  2[]  3[]      10 Dysarthria 0[x]  1[]  2[]  UN[]      11 Extinct. and Inattention 0[x]  1[]  2[]       TOTAL: 2.      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   GERD (gastroesophageal reflux disease)    HTN (hypertension)    Hyperlipemia    Osteopenia    Palpitations    Primary osteoarthritis of left hip    Shingles    Spastic colon    TIA (  transient ischemic attack)     Past Surgical History:  Procedure Laterality Date   ABDOMINAL HYSTERECTOMY     APPENDECTOMY     BREAST BIOPSY Left 1990's   lt bx x 2-neg   CARDIAC CATHETERIZATION N/A 01/19/2017   Procedure: Left Heart Cath and Coronary Angiography;  Surgeon: Vinie DELENA Jude, MD;  Location: [facility] INVASIVE CV LAB;  Service: Cardiovascular;  Laterality: N/A;   CARDIAC CATHETERIZATION N/A 01/19/2017   Procedure: Coronary Stent Intervention;  Surgeon: Marsa Dooms, MD;  Location: [facility] INVASIVE CV LAB;  Service: Cardiovascular;  Laterality: N/A;   Ileostomy and reversal N/A 2021   TONSILLECTOMY     TOTAL HIP ARTHROPLASTY Left 05/13/2023   Procedure: TOTAL HIP ARTHROPLASTY;  Surgeon: Mardee Lynwood SQUIBB, MD;  Location: [facility] ORS;  Service: Orthopedics;  Laterality: Left;    Family History: Family History  Problem Relation Age of  Onset   Breast cancer Cousin 55       mat cousin    Social History  reports that she has never smoked. She has never used smokeless tobacco. She reports current alcohol use. She reports that she does not use drugs.  Allergies  Allergen Reactions   Flagyl [Metronidazole]    Levofloxacin    Moxifloxacin    Septra [Sulfamethoxazole-Trimethoprim] Hives    Medications   Current Facility-Administered Medications:     stroke: early stages of recovery book, , Does not apply, Once, Paudel, Keshab, MD   0.9 %  sodium chloride  infusion, , Intravenous, Continuous, Paudel, Keshab, MD, Last Rate: 40 mL/hr at 11/05/24 2238, New Bag at 11/05/24 2238   acetaminophen  (TYLENOL ) tablet 650 mg, 650 mg, Oral, Q4H PRN **OR** acetaminophen  (TYLENOL ) 160 MG/5ML solution 650 mg, 650 mg, Per Tube, Q4H PRN, 650 mg at 11/05/24 2219 **OR** acetaminophen  (TYLENOL ) suppository 650 mg, 650 mg, Rectal, Q4H PRN, Paudel, Keshab, MD   aspirin  EC tablet 81 mg, 81 mg, Oral, Daily, Paudel, Keshab, MD, 81 mg at 11/05/24 1646   clopidogrel  (PLAVIX ) tablet 75 mg, 75 mg, Oral, Daily, Paudel, Keshab, MD   enoxaparin  (LOVENOX ) injection 40 mg, 40 mg, Subcutaneous, Q24H, Paudel, Keshab, MD, 40 mg at 11/05/24 2216   famotidine  (PEPCID ) tablet 20 mg, 20 mg, Oral, PRN, Paudel, Keshab, MD, 20 mg at 11/06/24 0536   fenofibrate  tablet 80 mg, 80 mg, Oral, Daily, Paudel, Keshab, MD   senna-docusate (Senokot-S) tablet 1 tablet, 1 tablet, Oral, QHS PRN, Paudel, Keshab, MD  Current Outpatient Medications:    cholecalciferol (VITAMIN D) 1000 UNITS tablet, Take 1,000 Units by mouth daily., Disp: , Rfl:    diclofenac Sodium (VOLTAREN) 1 % GEL, Apply 2 g topically in the morning and at bedtime., Disp: , Rfl:    docusate sodium  (COLACE) 50 MG capsule, Take 50 mg by mouth daily., Disp: , Rfl:    fenofibrate  160 MG tablet, Take 160 mg by mouth daily., Disp: , Rfl:    fluticasone  (FLONASE ) 50 MCG/ACT nasal spray, Place 1 spray into both nostrils 2  (two) times daily., Disp: 16 g, Rfl: 0   omeprazole  (PRILOSEC) 40 MG capsule, Take 40 mg by mouth daily., Disp: , Rfl:    traMADol  (ULTRAM ) 50 MG tablet, Take 1 tablet (50 mg total) by mouth every 4 (four) hours as needed for moderate pain., Disp: 30 tablet, Rfl: 0   famotidine  (PEPCID ) 20 MG tablet, Take 20 mg by mouth as needed for heartburn or indigestion. (Patient not taking: Reported on 11/05/2024), Disp: , Rfl:   Facility-Administered Medications Ordered  in Other Encounters:    0.9 %  sodium chloride  infusion, 250 mL, Intravenous, PRN, Bosie, Vinie LABOR, MD   [EXPIRED] 0.9% sodium chloride  infusion, 3 mL/kg/hr, Intravenous, Continuous **FOLLOWED BY** 0.9% sodium chloride  infusion, 1 mL/kg/hr, Intravenous, Continuous, Fath, Vinie LABOR, MD   sodium chloride  flush (NS) 0.9 % injection 3 mL, 3 mL, Intravenous, Q12H, Fath, Vinie LABOR, MD   sodium chloride  flush (NS) 0.9 % injection 3 mL, 3 mL, Intravenous, PRN, Bosie Vinie LABOR, MD  Vitals   Vitals:   11/06/24 0643 11/06/24 0700 11/06/24 0730 11/06/24 0800  BP:  (!) 141/75 138/70 (!) 144/77  Pulse:  78 80 77  Resp:  11 (!) 9 16  Temp: 98.7 F (37.1 C) 98.2 F (36.8 C)    TempSrc: Oral Oral    SpO2:  97% 96% 98%  Weight:      Height:        Body mass index is 27.43 kg/m.   Physical Exam  General: Awake alert in no distress HEENT: Normocephalic atraumatic Lungs clear.  Chest wall has bruising Neurological exam Awake alert oriented x 3. Speech is nondysarthric.  No evidence of aphasia Cranial nerves: Pupils equal round react light, extraocular movements intact, visual as well, facial sensation intact, symmetric facies, auditory acuity mildly diminished bilaterally, tongue and palate midline Motor examination: Upper extremities antigravity 5/5 bilaterally.  Lower extremities have mild drift in both-nonfocal. Sensation intact light touch without extinction Coordination examination reveals no dysmetria in the upper  extremities.  Labs/Imaging/Neurodiagnostic studies   CBC:  Recent Labs  Lab 12-03-2024 0954 Dec 03, 2024 2215  WBC 4.4 7.0  NEUTROABS 2.3  --   HGB 13.4 13.4  HCT 40.5 40.5  MCV 101.0* 101.3*  PLT 189 174   Basic Metabolic Panel:  Lab Results  Component Value Date   NA 145 Dec 03, 2024   K 3.6 Dec 03, 2024   CO2 22 12-03-2024   GLUCOSE 104 (H) 12-03-24   BUN 12 Dec 03, 2024   CREATININE 0.71 2024/12/03   CALCIUM 9.2 12-03-24   GFRNONAA >60 2024/12/03   GFRAA >60 12/14/2018   Lipid Panel:  Lab Results  Component Value Date   LDLCALC 132 (H) 11/06/2024   HgbA1c:  Lab Results  Component Value Date   HGBA1C 5.3 2024-12-03   Urine Drug Screen:     Component Value Date/Time   LABOPIA NONE DETECTED 12/03/2024 1021   COCAINSCRNUR NONE DETECTED December 03, 2024 1021   LABBENZ NONE DETECTED 12-03-24 1021   AMPHETMU NONE DETECTED 12/03/24 1021   THCU NONE DETECTED Dec 03, 2024 1021   LABBARB NONE DETECTED 03-Dec-2024 1021    INR  Lab Results  Component Value Date   INR 0.9 03-Dec-2024   APTT  Lab Results  Component Value Date   APTT 26 12-03-24   Imaging personally reviewed MRI of the brain with a 4 mm focus of acute infarct within the right temporal periventricular white matter. Multiple remote infarcts in bilateral cerebellum-more pronounced on the right. Remote cortical infarcts in bilateral frontal lobes.  Remote lacunar infarct in the left thalamus and left corona radiata. Susceptibility along the sulci in the right occipital lobe suggestive of prior subarachnoid hemorrhage.  Mild chronic microvascular ischemic changes. Disc osteophyte complexes at C3-4 likely contributing to spinal cord narrowing.  CT angiography head and neck No ELVO.  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  Atherosclerosis at skull base and carotid bifurcations  without hemodynamically  significant stenosis.  Moderate to severe stenosis of the origin of the right vertebral artery.  Prominent degenerative changes at the atlantodens and atlantoaxial articulations with degenerative soft tissue along the dorsal dens and mild basilar invagination.  Nonspecific focus in the left vallecula measuring up to 6 mm which may reflect a mucous retention cyst-recommend nonemergent correlation with direct visualization  ASSESSMENT   Kristen Lloyd is a 83 y.o. female past medical history of prior cerebellar infarct, CAD, hypertension, hyperlipidemia, osteopenia presented after having a fall and on imaging was noted to have a small area of acute infarction in the right temporal white matter adjacent to the ventricle. 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 Given the disease posterior circulation, the cerebellar infarcts in the past might have been because of that but this particular location is more in the MCA territory. Etiology of stroke remains cryptogenic.  Impression: Acute ischemic stroke-etiology cryptogenic  RECOMMENDATIONS  Agree with hospital admission Frequent neurochecks Telemetry 2D echo A1c-5.3.  Goal less than 7. Lipid panel-LDL 132.  She needs to be on high intensity statin for goal LDL less than 70. Preventive treatment: Aspirin  81 only.  She has small area of what looks like an old subarachnoid hemorrhage.  I would not want to do dual antiplatelets as the risks of bleeding might outweigh any benefits at this time. Therapy assessments May need outpatient 30-day cardiac monitoring Will follow the 2D echo and finalize recommendations then Plan discussed with Dr. Lenon   ADDENDUM 2D echo: IMPRESSIONS  1. Left ventricular ejection fraction, by estimation, is 55 to 60%. The  left ventricle  has normal function. The left ventricle has no regional  wall motion abnormalities. Left ventricular diastolic parameters are  consistent with Grade I diastolic  dysfunction (impaired relaxation).   2. Right ventricular systolic function is normal. The right ventricular  size is normal.   3. The mitral valve is normal in structure. Mild mitral valve  regurgitation. No evidence of mitral stenosis.   4. The aortic valve is normal in structure. Aortic valve regurgitation is  not visualized. No aortic stenosis is present.   5. The inferior vena cava is normal in size with greater than 50%  respiratory variability, suggesting right atrial pressure of 3 mmHg.   LA size normal.   Will recommend outpatient cardiac monitoring. Follow up outpatient neurology 8-12 weeks. Follows with [facility]   ______________________________________________________________________    Signed, Eligio Lav, MD [facility]

## 2024-11-06 NOTE — ED Notes (Signed)
 PARAMEDIC [facility] INFORMED OF ASSIGNED BED

## 2024-11-06 NOTE — ED Notes (Signed)
 Pt assisted to bedside commode by this tech. Pt returned to bed with no complaints. Call light within reach

## 2024-11-06 NOTE — Care Management Obs Status (Signed)
 MEDICARE OBSERVATION STATUS NOTIFICATION   Patient Details  Name: Kristen Lloyd MRN: [id number] Date of Birth: Nov 30, 1941   Medicare Observation Status Notification Given:  Chaney BRANDY CHRISTIANE LELON, CMA 11/06/2024, 10:43 AM

## 2024-11-06 NOTE — Progress Notes (Incomplete)
 PROGRESS NOTE Kristen Lloyd    DOB: 29-Dec-1940, 83 y.o.  FMW:[id number]    Code Status: Full Code   DOA: 11/05/2024   LOS: 0  Brief hospital course  [name] is a 83 y.o. female with a PMH significant for TIA, cerebellar infarct, diverticulitis, CAD, hyperlipidemia, HTN, osteoarthritis, hip replacement, osteopenia who presented to ED complaining of fall when she was trying to bend down to pick something up from the chair.  She stated that she was able to get up but was unsteady on her feet.  She was dizzy.  She complained of pain on the right lateral rib area and left knee.   ED Course: Upon arrival to the ED, patient is found to have positive CT scan of the brain acute or subacute cerebellar infarct, CT cervical spine showed no fracture, MRI of the brain showed acute BL cerebellar infarct.  Hospitalist service was consulted for evaluation for admission.  They presented from *** to the ED on 11/05/2024 with *** x *** days. ***  In the ED, it was found that they had ***.  Significant findings included ***.  They were initially treated with ***.   Patient was admitted to medicine service for further workup and management of *** as outlined in detail below.  11/06/24 -***  Assessment & Plan  Principal Problem:   Acute stroke due to ischemia [facility]) Active Problems:   Unstable angina (HCC)   HTN (hypertension)   Hyperlipidemia   Acute cerebellar stroke - She will be placed in observation - Placed on TIA/stroke protocol - MRI of the brain was already done - EDP called neurology will see the patient. - She will get aspirin  and Plavix , CT angio head and neck, 2D echo, A1c, lipid panel. - PT/OT/ST   2.  HTN - It appears that she does not take any medication at home - She will be permissive hypertension at this point   3.  GERD Resume Pepcid    4.  HLD - Resume home medications  Body mass index is 27.43 kg/m.  VTE ppx: enoxaparin  (LOVENOX ) injection 40 mg Start: 11/05/24  2200 SCD's Start: 11/05/24 1617   Diet:     Diet   Diet regular Room service appropriate? Yes; Fluid consistency: Thin   Consultants: ***  Subjective 11/06/24    Pt reports ***   Objective  Blood pressure (!) 141/75, pulse 78, temperature 98.2 F (36.8 C), temperature source Oral, resp. rate 11, height 5' (1.524 m), weight 63.7 kg, SpO2 97%. No intake or output data in the 24 hours ending 11/06/24 0756 Filed Weights   11/05/24 0936  Weight: 63.7 kg     Physical Exam: *** General: awake, alert, NAD HEENT: atraumatic, clear conjunctiva, anicteric sclera, MMM, hearing grossly normal Respiratory: normal respiratory effort. Cardiovascular: extremities well perfused, quick capillary refill, normal S1/S2, RRR, no JVD, murmurs Gastrointestinal: soft, NT, ND Nervous: A&O x3. no gross focal neurologic deficits, normal speech Extremities: moves all equally, no edema, normal tone Skin: dry, intact, normal temperature, normal color. No rashes, lesions or ulcers on exposed skin Psychiatry: normal mood, congruent affect  Labs   I have personally reviewed the following labs and imaging studies CBC    Component Value Date/Time   WBC 7.0 11/05/2024 2215   RBC 4.00 11/05/2024 2215   HGB 13.4 11/05/2024 2215   HGB 14.3 04/23/2014 1136   HCT 40.5 11/05/2024 2215   HCT 42.8 04/23/2014 1136   PLT 174 11/05/2024 2215   PLT  195 04/23/2014 1136   MCV 101.3 (H) 11/05/2024 2215   MCV 97 04/23/2014 1136   MCH 33.5 11/05/2024 2215   MCHC 33.1 11/05/2024 2215   RDW 14.4 11/05/2024 2215   RDW 14.4 04/23/2014 1136   LYMPHSABS 1.6 11/05/2024 0954   MONOABS 0.4 11/05/2024 0954   EOSABS 0.1 11/05/2024 0954   BASOSABS 0.0 11/05/2024 0954      Latest Ref Rng & Units 11/05/2024   10:15 PM 11/05/2024    1:06 PM 05/06/2023    1:30 PM  BMP  Glucose 70 - 99 mg/dL  895  78   BUN 8 - 23 mg/dL  12  17   Creatinine 9.55 - 1.00 mg/dL 9.28  9.18  9.15   Sodium 135 - 145 mmol/L  145  139    Potassium 3.5 - 5.1 mmol/L  3.6  3.9   Chloride 98 - 111 mmol/L  106  103   CO2 22 - 32 mmol/L  22  26   Calcium 8.9 - 10.3 mg/dL  9.2  9.0     CT ANGIO HEAD NECK W WO CM Result Date: 11/05/2024 EXAM: CTA HEAD AND NECK WITHOUT AND WITH 11/05/2024 06:10:09 PM TECHNIQUE: CTA of the head and neck was performed without and with the administration of 75 mL of iohexol  (OMNIPAQUE ) 350 MG/ML injection. Multiplanar 2D and/or 3D reformatted images are provided for review. Automated exposure control, iterative reconstruction, and/or weight based adjustment of the mA/kV was utilized to reduce the radiation dose to as low as reasonably achievable. Stenosis of the internal carotid arteries measured using NASCET criteria. COMPARISON: Same day CT head and MRI head. CLINICAL HISTORY: Neuro deficit, acute, stroke suspected. FINDINGS: CTA NECK: AORTIC ARCH AND ARCH VESSELS: Mild to moderate atherosclerosis of the visualized aortic arch. Atherosclerosis of the proximal right subclavian artery without significant stenosis. Limited evaluation of the proximal left subclavian artery due to streak artifact from dense venous contrast. No dissection or arterial injury. CERVICAL CAROTID ARTERIES: The right carotid artery is patent from the origin to the skull base. Mild tortuosity of the proximal right common carotid artery. Atherosclerosis at the right carotid bifurcation without hemodynamically significant stenosis. The left carotid artery is patent from the origin to the skull base. Atherosclerosis at the left carotid bifurcation without hemodynamically significant stenosis. No dissection or arterial injury. CERVICAL VERTEBRAL ARTERIES: Moderate to severe stenosis at the origin of the right vertebral artery. The vertebral arteries are relatively diminished in caliber but are patent from the origins to the V3 segments. The right vertebral artery primarily terminates in a muscular branch at the level of C1. There is a diminutive  branch from the distal right V3 segment which courses intracranially. The left vertebral artery terminates at the origin of the left PICA. No dissection or arterial injury. LUNGS AND MEDIASTINUM: Unremarkable. SOFT TISSUES: Nonspecific focus in the left vallecula measuring up to 6 mm which may reflect a mucous retention cyst. Recommend nonemergent correlation with direct visualization. Possible additional nodular focus in the right vallecula abutting the right pharyngoepiglottic fold. BONES: Prominent degenerative changes at the atlantodens and atlantoaxial articulations. Degenerative soft tissue along the dorsal aspect of the dens. There is mild basilar invagination noted likely related to degenerative changes. Additional degenerative changes throughout the cervical spine and upper thoracic spine with severe disc space narrowing at multiple levels. Periapical lucencies of multiple maxillary teeth. CTA HEAD: ANTERIOR CIRCULATION: Intracranial internal carotid arteries are patent bilaterally. Atherosclerosis of the carotid siphons resulting in mild stenosis  bilaterally. The anterior cerebral arteries are patent bilaterally. The middle cerebral arteries are patent bilaterally. There is mild irregularity and narrowing of the proximal M1 segment of the left MCA. No aneurysm. POSTERIOR CIRCULATION: The basilar artery appears occluded proximally with reconstitution near the level of the pontomedullary junction. The basilar artery is small in caliber and irregular with multifocal narrowing. The left PICA is supplied via the left vertebral artery. The right PICA is diminutive in caliber, appears patent, and is likely reconstituted via collaterals. The superior cerebellar arteries are patent bilaterally. Posterior communicating arteries noted bilaterally. Fetal origin of the left PCA. The right PCA is primarily supplied via the right posterior communicating artery with a small P1 segment also noted. There is mild to moderate  narrowing of the P2 segment of the left PCA. 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 No aneurysm. OTHER: No acute intracranial hemorrhage. Redemonstration of numerous chronic infarcts within the bilateral cerebellum slightly more pronounced on the right. Chronic microvascular ischemic changes. Remote cortical infarcts in the frontal lobes better evaluated on same day MRI brain. Atherosclerosis at the skull base. No midline shift. The basilar cisterns are patent. Mucosal thickening in the right sphenoid sinus. No dural venous sinus thrombosis on this non-dedicated study. IMPRESSION: 1. No evidence of acute large vessel occlusion. 2. 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 3. Atherosclerosis at the skull base and carotid bifurcations without hemodynamically significant stenosis. 4. Moderate to severe stenosis at the origin of the right vertebral artery. 5. Prominent degenerative changes at the atlantodens and atlantoaxial articulations with degenerative soft tissue along the dorsal dens and mild basilar invagination. 6. Nonspecific focus in the left vallecula measuring up to 6 mm which may reflect a mucous retention cyst. Recommend nonemergent correlation with direct visualization. Electronically signed by: Donnice Mania MD 11/05/2024 06:51 PM EST RP Workstation: [id number]   MR BRAIN WO CONTRAST Result Date: 11/05/2024 EXAM: MRI BRAIN WITHOUT CONTRAST 11/05/2024 02:06:28 PM TECHNIQUE: Multiplanar multisequence MRI of the head/brain was performed without the administration of intravenous contrast. COMPARISON: Same day CT head. CLINICAL HISTORY: Transient ischemic attack (TIA). FINDINGS: BRAIN AND VENTRICLES: There is a 4 mm focus of  acute infarct within the right temporal periventricular white matter. Multiple remote infarcts are present in the bilateral cerebellum, more pronounced on the right. Remote cortical infarcts are noted in the bilateral frontal lobes. Susceptibility along the sulci in the right occipital lobe is suggestive of prior subarachnoid hemorrhage. A remote lacunar infarct is present in the left thalamus. A remote infarct is seen in the left corona radiata. Scattered supratentorial white matter signal abnormalities are suggestive of mild chronic microvascular ischemic changes. No acute intracranial hemorrhage. No mass. No midline shift. No hydrocephalus. The sella is unremarkable. Normal flow voids. ORBITS: No acute abnormality. SINUSES AND MASTOIDS: Mild mucosal thickening in the right sphenoid sinus. Trace fluid in the right mastoid tip. BONES AND SOFT TISSUES: Normal marrow signal. No acute soft tissue abnormality. Changes in the visualized upper cervical spine: Disc osteophyte complex at C3-C4 likely contributing to spinal canal narrowing. VISUALIZED LUNGS: Changes of the right and left pleural effusions are not obvious. IMPRESSION: 1. 4 mm focus of acute infarct within the right temporal periventricular white matter. 2. Multiple remote infarcts in the bilateral cerebellum, more pronounced on the  right. 3. Remote cortical infarcts in the bilateral frontal lobes. Remote lacunar infarct in the left thalamus and the left corona radiata. 4. Susceptibility along the sulci in the right occipital lobe suggestive of prior subarachnoid hemorrhage. 5. Mild chronic microvascular ischemic changes. 6. Disc osteophyte complex at C3-C4 likely contributing to spinal canal narrowing. Electronically signed by: Donnice Mania MD 11/05/2024 02:44 PM EST RP Workstation: [id number]   DG Knee Complete 4 Views Left Result Date: 11/05/2024 CLINICAL DATA:  Left knee pain after a fall. EXAM: LEFT KNEE - COMPLETE 4+ VIEW COMPARISON:  None  Available. FINDINGS: No joint effusion or fracture. Tricompartment osteophytosis. Medial joint space narrowing. IMPRESSION: 1. No acute findings. 2. Tricompartment osteoarthritis. Electronically Signed   By: Newell Eke M.D.   On: 11/05/2024 11:43   DG Hip Unilat W or Wo Pelvis 2-3 Views Left Result Date: 11/05/2024 CLINICAL DATA:  Fall with left hip pain. EXAM: DG HIP (WITH OR WITHOUT PELVIS) 2-3V LEFT COMPARISON:  None Available. FINDINGS: Left hip arthroplasty. No fracture or dislocation. Osteopenia. Degenerative changes in the spine. IMPRESSION: No acute findings. Electronically Signed   By: Newell Eke M.D.   On: 11/05/2024 11:38   DG Ribs Unilateral W/Chest Right Result Date: 11/05/2024 CLINICAL DATA:  Fall, right upper anterior rib pain. EXAM: RIGHT RIBS AND CHEST - 3+ VIEW COMPARISON:  10/27/2013. FINDINGS: Frontal view of the chest shows midline trachea. Heart size normal. Thoracic aorta is calcified. Lungs are mildly hyperinflated but clear. No pleural fluid. No pneumothorax. Dedicated views of the right ribs show no definite acute fracture. IMPRESSION: No acute findings. Electronically Signed   By: Newell Eke M.D.   On: 11/05/2024 11:37   CT Cervical Spine Wo Contrast Result Date: 11/05/2024 EXAM: CT CERVICAL SPINE WITHOUT CONTRAST 11/05/2024 11:02:00 AM TECHNIQUE: CT of the cervical spine was performed without the administration of intravenous contrast. Multiplanar reformatted images are provided for review. Automated exposure control, iterative reconstruction, and/or weight based adjustment of the mA/kV was utilized to reduce the radiation dose to as low as reasonably achievable. COMPARISON: Head CT today reported separately. CLINICAL HISTORY: Neck trauma (Age >= 65y). FINDINGS: CERVICAL SPINE: BONES AND ALIGNMENT: No acute fracture or traumatic malalignment. DEGENERATIVE CHANGES: Chronic severe cervical spine degeneration and degenerative appearing ankylosis of the C2-C3, C4  through C6 cervical levels. Similar developing ankylosis in the lower cervical spine, and in the visible upper thoracic spine. Chronic severe C1-C2 degeneration superimposed with complete bilateral C1-C2 joint space loss, bulky osteophytosis, and sclerosis. Associated bulky ligamentous hypertrophy about the odontoid. Subsequent mild spinal stenosis at the cervicomedullary junction and C1 level. SOFT TISSUES: No prevertebral soft tissue swelling. Calcified cervical carotid atherosclerosis. Partially visible aortic arch calcified atherosclerosis. Otherwise negative visible non-contrast thoracic inlet. IMPRESSION: 1. No acute traumatic injury identified in the cervical spine. 2. 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 Very severe C1-C2 degeneration with spinal stenosis (probably mild) at the cervicomedullary junction and C1 level. Electronically signed by: Helayne Hurst MD 11/05/2024 11:14 AM EST RP Workstation: [id number]   CT HEAD WO CONTRAST ( ) Result Date: 11/05/2024 EXAM: CT HEAD WITHOUT CONTRAST 11/05/2024 11:02:00 AM TECHNIQUE: CT of the head was performed without the administration of intravenous contrast. Automated exposure control, iterative reconstruction, and/or weight based adjustment of the mA/kV was utilized to reduce the radiation dose to as low as reasonably achievable. COMPARISON: None available. CLINICAL HISTORY: 83 year old female status post fall, dizziness, T1. FINDINGS: BRAIN AND VENTRICLES: Brain volume within normal limits  for age. Mix of circumscribed and indistinct hypodensity in the right cerebellum, in part chronic right SCA territory infarct with encephalomalacia, but difficult to exclude superimposed acute or subacute cerebellar infarct on that side. Contralateral small but circumscribed left cerebellar infarcts appear chronic. Supratentorial gray white differentiation better maintained. Mild for age cerebral white matter  hypodensity. Extensive calcified atherosclerosis at the skull base. No suspicious intracranial vascular hyperdensity. No acute hemorrhage. No hydrocephalus. No extra-axial collection. No mass effect or midline shift. ORBITS: No acute abnormality. No orbital scalp soft tissue injury identified. SINUSES: Chronic right sphenoid sinus mucoperiosteal thickening. Other visible paranasal sinuses, mastoids, and milia are well aerated. SOFT TISSUES AND SKULL: Left vertex scalp sebaceous type cyst. No acute soft tissue abnormality. No skull fracture. Partially visible advanced cervical spine degeneration. IMPRESSION: 1. Advanced chronic cerebellar ischemia, And cannot exclude acute or subacute ischemia in the right cerebellum. 2. No other acute intracranial abnormality. No acute traumatic injury identified. Electronically signed by: Helayne Hurst MD 11/05/2024 11:11 AM EST RP Workstation: [id number]    Disposition Plan & Communication  Patient status: Observation  Admitted From: {From:23814} Planned disposition location: {PLAN; DISPOSITION:26386} Anticipated discharge date: *** pending ***  Family Communication: ***    Author: Marien LITTIE Piety, DO [facility] 11/06/2024, 7:56 AM   Available by Epic secure chat 7AM-7PM. If 7PM-7AM, please contact night-coverage.  [facility] contact information found on [email or web address].

## 2024-11-06 NOTE — Evaluation (Signed)
 Physical Therapy Evaluation Patient Details Name: Kristen Lloyd MRN: [id number] DOB: 19-Jul-1941 Today's Date: 11/06/2024  History of Present Illness  Kristen Lloyd is a pleasant 83 y.o. female with medical history significant for TIA, cerebellar infarct, diverticulitis, CAD, hyperlipidemia, HTN, osteoarthritis, hip replacement, osteopenia who presented to ED complaining of fall. MRI of the brain showed 4 mm focus of acute infarct within the right temporal periventricular white matter.  Clinical Impression  Patient is agreeable to PT session. She reports she uses a cane intermittently at baseline. She lives with her spouse and does not typically drive.  Today the patient reports feeling fatigued after activity. She required hand held assistance for hallway ambulation. Mild dizziness reported with activity that does not worsen. Vitals stable on room air. The patient does not appear to be back to baseline level of functional independence. Recommend PT follow up to maximize independence and decrease caregiver burden. Recommend intermittent caregiver support at home.       If plan is discharge home, recommend the following: A little help with walking and/or transfers;A little help with bathing/dressing/bathroom;Assist for transportation;Help with stairs or ramp for entrance;Assistance with cooking/housework   Can travel by private vehicle        Equipment Recommendations None recommended by PT  Recommendations for Other Services       Functional Status Assessment Patient has had a recent decline in their functional status and demonstrates the ability to make significant improvements in function in a reasonable and predictable amount of time.     Precautions / Restrictions Precautions Precautions: Fall Recall of Precautions/Restrictions: Intact Restrictions Weight Bearing Restrictions Per Provider Order: No      Mobility  Bed Mobility Overal bed mobility: Needs Assistance Bed  Mobility: Supine to Sit     Supine to sit: Supervision          Transfers Overall transfer level: Needs assistance Equipment used: 1 person hand held assist Transfers: Sit to/from Stand Sit to Stand: Contact guard assist                Ambulation/Gait Ambulation/Gait assistance: Contact guard assist, Min assist Gait Distance (Feet): 70 Feet Assistive device: 1 person hand held assist Gait Pattern/deviations: Step-through pattern Gait velocity: decreased     General Gait Details: mild unsteadiness with walking. encouraged patient to use rolling walker at home for safety initially rather than cane. patient reports feeling fatigue after walking. no focal weakness is noted with ambulation  Stairs            Wheelchair Mobility     Tilt Bed    Modified Rankin (Stroke Patients Only)       Balance Overall balance assessment: Needs assistance Sitting-balance support: No upper extremity supported Sitting balance-Leahy Scale: Good     Standing balance support: No upper extremity supported Standing balance-Leahy Scale: Poor                               Pertinent Vitals/Pain Pain Assessment Pain Assessment: Faces Faces Pain Scale: Hurts little more Pain Location: L hip, back of right thigh Pain Descriptors / Indicators: Discomfort Pain Intervention(s): Limited activity within patient's tolerance, Monitored during session, Repositioned    Home Living Family/patient expects to be discharged to:: Private residence Living Arrangements: Spouse/significant other Available Help at Discharge: Family Type of Home: House Home Access: Stairs to enter Entrance Stairs-Rails: Can reach both Entrance Stairs-Number of Steps: 3   Home Layout:  One level Home Equipment: Cane - single point Additional Comments: spouse is 56    Prior Function Prior Level of Function : Independent/Modified Independent             Mobility Comments: using cane in the  community ADLs Comments: has not been driving recently     Extremity/Trunk Assessment   Upper Extremity Assessment Upper Extremity Assessment: LUE deficits/detail;Right hand dominant LUE Deficits / Details: grossly 4-/5    Lower Extremity Assessment Lower Extremity Assessment: Generalized weakness       Communication   Communication Communication: No apparent difficulties    Cognition Arousal: Alert Behavior During Therapy: WFL for tasks assessed/performed   PT - Cognitive impairments: Memory                         Following commands: Intact       Cueing Cueing Techniques: Verbal cues     General Comments General comments (skin integrity, edema, etc.): vitals stable with activity    Exercises     Assessment/Plan    PT Assessment Patient needs continued PT services  PT Problem List Decreased strength;Decreased range of motion;Decreased activity tolerance;Decreased balance;Decreased mobility;Decreased safety awareness;Decreased knowledge of use of DME       PT Treatment Interventions DME instruction;Gait training;Stair training;Functional mobility training;Therapeutic exercise;Balance training;Therapeutic activities;Neuromuscular re-education;Cognitive remediation;Patient/family education    PT Goals (Current goals can be found in the Care Plan section)  Acute Rehab PT Goals Patient Stated Goal: to get better PT Goal Formulation: With patient Time For Goal Achievement: 11/20/24 Potential to Achieve Goals: Fair    Frequency Min 2X/week     Co-evaluation               AM-PAC PT 6 Clicks Mobility  Outcome Measure Help needed turning from your back to your side while in a flat bed without using bedrails?: None Help needed moving from lying on your back to sitting on the side of a flat bed without using bedrails?: A Little Help needed moving to and from a bed to a chair (including a wheelchair)?: A Little Help needed standing up from a  chair using your arms (e.g., wheelchair or bedside chair)?: A Little Help needed to walk in hospital room?: A Little Help needed climbing 3-5 steps with a railing? : A Little 6 Click Score: 19    End of Session Equipment Utilized During Treatment: Gait belt Activity Tolerance: Patient limited by fatigue Patient left: in chair;with call bell/phone within reach;with family/visitor present Nurse Communication: Mobility status PT Visit Diagnosis: Muscle weakness (generalized) (M62.81);Unsteadiness on feet (R26.81)    Time: 1000-1030 PT Time Calculation (min) (ACUTE ONLY): 30 min   Charges:   PT Evaluation $PT Eval Low Complexity: 1 Low   PT General Charges $$ ACUTE PT VISIT: 1 Visit         Randine Essex, PT, MPT   Randine LULLA Essex 11/06/2024, 1:17 PM

## 2024-11-12 ENCOUNTER — Ambulatory Visit: Admitting: Nurse Practitioner

## 2024-11-12 ENCOUNTER — Other Ambulatory Visit

## 2024-11-16 ENCOUNTER — Ambulatory Visit: Admitting: Nurse Practitioner

## 2024-11-16 ENCOUNTER — Other Ambulatory Visit

## 2024-11-16 NOTE — Progress Notes (Signed)
 Cardiology Follow Up Patient Visit  PRIMARY CARE PROVIDER:   Epifanio Alm Dover, MD [address] [place] [address]  Obera, Stauch 11/16/2024 DOB: [birth year] Age: 83 y.o. [facility]    Chief Complaint  Patient presents with  . f/u after hospitalization    History of Present Illness  Kristen Lloyd is a 83 y.o. female here for f/u after recent hospitalization. Pt was hospitalized at [facility] 11/10-11/11/25 for acute ischemic CVA. Has a hx of cerebellar infarct, TIA, CAD w/ DES PCI to RCA in 2018, HTN, HLD. In hospital, brain imaging showed 4mm focus of acute infarct within R temporal periventricular white matter as well as several remote infarcts. Echo unremarkable. Started ASA, statin. 30d monitor ordered as well. No palpitations. No SOB or DOE. No PND or orthopnea. No bleeding.    Current Medications and Allergies   Allergies  Allergen Reactions  . Levaquin [Levofloxacin] Other (See Comments)    Lower leg pain suspicious for achilles tendinopathy  . Flagyl [Metronidazole] Other (See Comments)    Able to tolerate if needed, but refuses if possible due to dysgeusia   . Moxifloxacin Other (See Comments)    spaced out  . Norvasc [Amlodipine] Other (See Comments)    Fatigue at high dose  . Sulfamethoxazole-Trimethoprim Nausea, Rash and Vomiting    Current Outpatient Medications  Medication Sig Dispense Refill  . amoxicillin (AMOXIL) 500 MG capsule Take 4 capsules 1 hour before the dental procedure 4 capsule 2  . aspirin  81 MG EC tablet Take 81 mg by mouth once daily    . atorvastatin  (LIPITOR) 80 MG tablet Take 80 mg by mouth once daily    . cholecalciferol (VITAMIN D3) 1,000 unit tablet Take 1 Units by mouth once daily.    . fenofibrate  160 MG tablet Take 1 tablet (160 mg total) by mouth once daily (Patient taking differently: Take 80 mg by mouth once daily) 90 tablet 3  . fluticasone  propionate (FLONASE ) 50 mcg/actuation nasal spray SHAKE LIQUID AND  USE 1 SPRAY IN EACH NOSTRIL TWICE DAILY 48 g 3  . omeprazole  (PRILOSEC) 40 MG DR capsule Take 40 mg by mouth once daily    . traMADoL  (ULTRAM ) 50 mg tablet Take 1 tablet (50 mg total) by mouth at bedtime as needed for Pain 30 tablet 5   No current facility-administered medications for this visit.    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  Chronic 04/21/2022 - Present    Primary osteoarthritis of left shoulder M19.012  Chronic 03/26/2022 - Present    Rotator cuff tendinitis, left M75.82  Acute 03/26/2022 - Present    Nontraumatic incomplete tear of left rotator cuff M75.112   03/26/2022 - Present    Diverticulitis of large intestine with abscess without bleeding K57.20   12/25/2019 - Present    Globus sensation R09.A2   03/08/2016 - Present    Osteoarthritis M19.90   Unknown - Present    Atrophic vaginitis N95.2   06/03/2014 - Present    Hyperlipidemia E78.5   06/03/2014 - Present    CAD (  coronary artery disease) I25.10   06/03/2014 - Present    HTN (hypertension) I10   06/03/2014 - Present    Palpitation R00.2   06/03/2014 - Present    TIA (transient ischemic attack) G45.9   06/03/2014 - Present    RESOLVED: 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 Added automatically from request for surgery [id number]      RESOLVED: Stroke (CMS/HHS-HCC) I63.9   09/17/2014 - 01/31/2017    RESOLVED: Hypertension I10   Unknown - 08/10/2016     ADDITIONAL MEDICAL HISTORY: Past Medical History:  Diagnosis  Date  . Allergic state   . Atrophic vaginitis   . CAD (coronary artery disease)   . Cataract cortical, senile   . Diverticulitis   . Diverticulosis    noted to be extensive 2015  . History of shingles   . Hyperlipidemia   . Hypertension   . Osteoarthritis   . Osteopenia   . Sinusitis, unspecified   . Spastic colon   . Stroke (CMS/HHS-HCC)   . TIA (transient ischemic attack) 03/27/2014   Transient slurred speech - cerebellar - follows with Dr Maree    Social History: See HPI above. Additionally: Social History   Socioeconomic History  . Marital status: Married    Spouse name: Keeya Dyckman  . Number of children: 2  . Years of education: 73  . Highest education level: High school graduate  Occupational History  . Occupation: Retired Nurse, [employer]  Tobacco Use  . Smoking status: Never  . Smokeless tobacco: Never  Vaping Use  . Vaping status: Never Used  Substance and Sexual Activity  . Alcohol use: Yes    Alcohol/week: 7.0 standard drinks of alcohol    Types: 7 Shots of liquor per week    Comment: drink 1 per day  . Drug use: No  . Sexual activity: Defer    Partners: Male   Social Drivers of Health   Financial Resource Strain: Low Risk  (07/26/2024)   Overall Financial Resource Strain (CARDIA)   . Difficulty of Paying Living Expenses: Not hard at all  Food Insecurity: No Food Insecurity (11/05/2024)   Received from [facility]   Hunger Vital Sign   . Within the past 12 months, you worried that your food would run out before you got the money to buy more.: Never true   . Within the past 12 months, the food you bought just didn't last and you didn't have money to get more.: Never true  Transportation Needs: No Transportation Needs (11/05/2024)   Received from [facility] - Transportation   . In the past 12 months, has lack of transportation kept you from medical appointments or from getting medications?: No   . In the past 12 months, has lack of  transportation kept you from meetings, work, or from getting things needed for daily living?: No  Social Connections: Unknown (11/05/2024)   Received from [facility]   Social Connection and Isolation Panel   . In a typical week, how many times do you talk on the phone with family, friends, or neighbors?: Patient declined   . How often do you get together with friends or relatives?: Patient declined   . How often do you attend church or religious services?: Patient declined   . Do you belong to  any clubs or organizations such as church groups, unions, fraternal or athletic groups, or school groups?: Patient declined   . How often do you attend meetings of the clubs or organizations you belong to?: Patient declined   . Are you married, widowed, divorced, separated, never married, or living with a partner?: Married  Housing Stability: Low Risk  (11/16/2024)   Housing Stability Vital Sign   . Unable to Pay for Housing in the Last Year: No   . Number of Times Moved in the Last Year: 0   . Homeless in the Last Year: No    Family History: See HPI above. Additionally: Family History  Problem Relation Name Age of Onset  . Asthma Mother    . Hyperlipidemia (Elevated cholesterol) Mother    . Thyroid  disease Mother    . Stroke Father    . Dementia Father    . Hyperlipidemia (Elevated cholesterol) Sister    . Bipolar disorder Sister    . Colon polyps Brother      Review of Systems: See HPI above. Additionally: Constitutional: no fevers or chills Eyes: no diplopia ENT: no epistaxis Cardiovascular: See HPI above Respiratory: See HPI above Gastrointestinal: no BRBPR Genitourinary: no hematuria Musculoskeletal: no joint swelling Skin: no rash Neurological: no strokelike sx Psychiatric: no SI Endocrine: no night sweats Hematologic/Lymphatic: no bleeding Allergic/Immunologic: See allergies above   Physical Exam   Vitals:   11/16/24 1136  BP: 120/74  Pulse: 84  Resp: 16   Body  mass index is 26.64 kg/m.  Wt Readings from Last 3 Encounters:  11/16/24 64 kg (141 lb)  11/13/24 64.4 kg (142 lb)  07/26/24 62.6 kg (138 lb)   BP Readings from Last 3 Encounters:  11/16/24 120/74  11/13/24 124/78  07/26/24 116/72   Pulse Readings from Last 3 Encounters:  11/16/24 84  11/13/24 72  07/26/24 91     Gen: NAD Neck: No JVD CV: nl s1s2, RRR, no M/R/G Resp: CTAB, nl effort GI: abd soft, NT Skin: warm Edema: no LE edema MSK: no obvious deformity Psych: nl affect Neuro: awake and alert Endo: no thyromegaly Lymph: no cervical LAD   Data/Results   Recent Labs    12/29/21 0847 06/17/22 0903 07/20/23 0910  CHOLTOTAL 231* 233* 218*  HDL 66.7 64.9 56.4  LDLCALC 142* 149* 141*  VLDL 22 19 21   TRIG 111 95 103    Recent Labs    07/20/23 0910 01/27/24 1353 07/26/24 1213  NA 141 142 142  K 4.4 4.6 4.3  BUN 14 16 17   CREATININE 0.8 1.1 0.8  CO2 27.2 31.8 30.2  GLUCOSE 92 91 60*  ALT 7 10 11   AST 13 17 16   TBILI 0.5 0.4 0.5  ALB 3.9 4.0 4.1    Recent Labs    10/26/23 1053 01/27/24 1353 07/26/24 1213  WBC 4.8 5.8 6.3  HGB 12.4 12.7 12.7  HCT 37.8 37.3 37.9  MCV 99.2 101.4* 101.3*  PLT 191 199 197    Recent Labs    07/20/23 0910 10/26/23 1053 01/27/24 1353  TSH 5.350* 4.485 2.989     ECG: I personally interpreted tracing from 11/05/24 during hospitalization for CVA: NSR,   TTE 11/06/24  1. Left ventricular ejection fraction, by estimation, is 55 to 60%. The left ventricle has normal function. The left ventricle has no regional wall motion abnormalities. Left ventricular diastolic parameters are consistent with Grade I diastolic  dysfunction (impaired relaxation).   2. Right ventricular systolic function  is normal. The right ventricular size is normal.   3. The mitral valve is normal in structure. Mild mitral valve regurgitation. No evidence of mitral stenosis.   4. The aortic valve is normal in structure. Aortic valve regurgitation is  not visualized. No aortic stenosis is present.   5. The inferior vena cava is normal in size with greater than 50% respiratory variability, suggesting right atrial pressure of 3 mmHg.   Assessment  83 y.o. female patient with:  -history of stroke -CAD w/ DES PCI to RCA in 2018 -HTN -HLD -long term use of ASA  Plan   -continue ASA, statin -will f/u results of 30d monitor -f/u six mos, sooner PRN   Attestation Statement:   I personally performed the service. (TP)  DEARL LEAVEN, MD   Dearl Leaven MD MHS [facility] Assistant Professor of Medicine, Division of Cardiology [facility]

## 2024-12-02 ENCOUNTER — Observation Stay

## 2024-12-02 ENCOUNTER — Other Ambulatory Visit: Payer: Self-pay

## 2024-12-02 ENCOUNTER — Emergency Department

## 2024-12-02 ENCOUNTER — Observation Stay
Admission: EM | Admit: 2024-12-02 | Discharge: 2024-12-03 | Disposition: A | Attending: Emergency Medicine | Admitting: Emergency Medicine

## 2024-12-02 DIAGNOSIS — I2510 Atherosclerotic heart disease of native coronary artery without angina pectoris: Secondary | ICD-10-CM | POA: Diagnosis present

## 2024-12-02 DIAGNOSIS — I639 Cerebral infarction, unspecified: Secondary | ICD-10-CM | POA: Diagnosis not present

## 2024-12-02 DIAGNOSIS — Z79899 Other long term (current) drug therapy: Secondary | ICD-10-CM | POA: Insufficient documentation

## 2024-12-02 DIAGNOSIS — I10 Essential (primary) hypertension: Secondary | ICD-10-CM | POA: Diagnosis present

## 2024-12-02 DIAGNOSIS — R202 Paresthesia of skin: Secondary | ICD-10-CM | POA: Diagnosis present

## 2024-12-02 DIAGNOSIS — E785 Hyperlipidemia, unspecified: Secondary | ICD-10-CM | POA: Diagnosis present

## 2024-12-02 DIAGNOSIS — Z96642 Presence of left artificial hip joint: Secondary | ICD-10-CM | POA: Diagnosis not present

## 2024-12-02 DIAGNOSIS — Z7982 Long term (current) use of aspirin: Secondary | ICD-10-CM | POA: Diagnosis not present

## 2024-12-02 DIAGNOSIS — I6389 Other cerebral infarction: Secondary | ICD-10-CM | POA: Diagnosis not present

## 2024-12-02 LAB — DIFFERENTIAL
Abs Immature Granulocytes: 0.01 K/uL (ref 0.00–0.07)
Basophils Absolute: 0 K/uL (ref 0.0–0.1)
Basophils Relative: 0 %
Eosinophils Absolute: 0 K/uL (ref 0.0–0.5)
Eosinophils Relative: 1 %
Immature Granulocytes: 0 %
Lymphocytes Relative: 25 %
Lymphs Abs: 1.4 K/uL (ref 0.7–4.0)
Monocytes Absolute: 0.4 K/uL (ref 0.1–1.0)
Monocytes Relative: 8 %
Neutro Abs: 3.6 K/uL (ref 1.7–7.7)
Neutrophils Relative %: 66 %

## 2024-12-02 LAB — PROTIME-INR
INR: 0.9 (ref 0.8–1.2)
Prothrombin Time: 12.7 s (ref 11.4–15.2)

## 2024-12-02 LAB — COMPREHENSIVE METABOLIC PANEL WITH GFR
ALT: 11 U/L (ref 0–44)
AST: 20 U/L (ref 15–41)
Albumin: 4.2 g/dL (ref 3.5–5.0)
Alkaline Phosphatase: 113 U/L (ref 38–126)
Anion gap: 12 (ref 5–15)
BUN: 12 mg/dL (ref 8–23)
CO2: 25 mmol/L (ref 22–32)
Calcium: 9.2 mg/dL (ref 8.9–10.3)
Chloride: 104 mmol/L (ref 98–111)
Creatinine, Ser: 0.69 mg/dL (ref 0.44–1.00)
GFR, Estimated: >60 mL/min (ref >60)
Glucose, Bld: 104 mg/dL — ABNORMAL HIGH (ref 70–99)
Potassium: 4.1 mmol/L (ref 3.5–5.1)
Sodium: 141 mmol/L (ref 135–145)
Total Bilirubin: 0.6 mg/dL (ref 0.0–1.2)
Total Protein: 6.7 g/dL (ref 6.5–8.1)

## 2024-12-02 LAB — TROPONIN T, HIGH SENSITIVITY
Troponin T High Sensitivity: 21 ng/L — ABNORMAL HIGH (ref 0–19)
Troponin T High Sensitivity: 23 ng/L — ABNORMAL HIGH (ref 0–19)
Troponin T High Sensitivity: 26 ng/L — ABNORMAL HIGH (ref 0–19)

## 2024-12-02 LAB — LIPID PANEL
Cholesterol: 240 mg/dL — ABNORMAL HIGH (ref 0–200)
HDL: 69 mg/dL (ref >40)
LDL Cholesterol: 152 mg/dL — ABNORMAL HIGH (ref 0–99)
Total CHOL/HDL Ratio: 3.5 ratio
Triglycerides: 97 mg/dL (ref <150)
VLDL: 19 mg/dL (ref 0–40)

## 2024-12-02 LAB — CBC
HCT: 40.7 % (ref 36.0–46.0)
Hemoglobin: 13.5 g/dL (ref 12.0–15.0)
MCH: 33.6 pg (ref 26.0–34.0)
MCHC: 33.2 g/dL (ref 30.0–36.0)
MCV: 101.2 fL — ABNORMAL HIGH (ref 80.0–100.0)
Platelets: 186 K/uL (ref 150–400)
RBC: 4.02 MIL/uL (ref 3.87–5.11)
RDW: 14.5 % (ref 11.5–15.5)
WBC: 5.4 K/uL (ref 4.0–10.5)
nRBC: 0 % (ref 0.0–0.2)

## 2024-12-02 LAB — ETHANOL: Alcohol, Ethyl (B): <15 mg/dL (ref <15)

## 2024-12-02 LAB — MAGNESIUM: Magnesium: 2.1 mg/dL (ref 1.7–2.4)

## 2024-12-02 LAB — APTT: aPTT: 27 s (ref 24–36)

## 2024-12-02 MED ORDER — HEPARIN SODIUM (PORCINE) 5000 UNIT/ML IJ SOLN
5000.0000 [IU] | Freq: Three times a day (TID) | INTRAMUSCULAR | Status: DC
  Administered 2024-12-03: 5000 [IU] via SUBCUTANEOUS
  Filled 2024-12-02: qty 1

## 2024-12-02 MED ORDER — LORAZEPAM 2 MG/ML IJ SOLN
0.5000 mg | Freq: Once | INTRAMUSCULAR | Status: AC | PRN
  Administered 2024-12-02: 0.5 mg via INTRAVENOUS
  Filled 2024-12-02: qty 1

## 2024-12-02 MED ORDER — ROSUVASTATIN CALCIUM 20 MG PO TABS
20.0000 mg | ORAL_TABLET | Freq: Every day | ORAL | Status: DC
  Administered 2024-12-03: 20 mg via ORAL
  Filled 2024-12-02: qty 1

## 2024-12-02 MED ORDER — ACETAMINOPHEN 650 MG RE SUPP: 650.0000 mg | Freq: Four times a day (QID) | RECTAL | Status: DC | PRN

## 2024-12-02 MED ORDER — ONDANSETRON HCL 4 MG/2ML IJ SOLN: 4.0000 mg | Freq: Four times a day (QID) | INTRAMUSCULAR | Status: DC | PRN

## 2024-12-02 MED ORDER — ASPIRIN 81 MG PO CHEW
243.0000 mg | CHEWABLE_TABLET | Freq: Once | ORAL | Status: AC
  Administered 2024-12-02: 243 mg via ORAL
  Filled 2024-12-02: qty 3

## 2024-12-02 MED ORDER — SODIUM CHLORIDE 0.9% FLUSH
3.0000 mL | Freq: Two times a day (BID) | INTRAVENOUS | Status: DC
  Administered 2024-12-03: 3 mL via INTRAVENOUS

## 2024-12-02 MED ORDER — STROKE: EARLY STAGES OF RECOVERY BOOK: Freq: Once | Status: AC

## 2024-12-02 MED ORDER — SENNOSIDES-DOCUSATE SODIUM 8.6-50 MG PO TABS: 1.0000 | ORAL_TABLET | Freq: Every evening | ORAL | Status: DC | PRN

## 2024-12-02 MED ORDER — ACETAMINOPHEN 325 MG PO TABS: 650.0000 mg | ORAL_TABLET | Freq: Four times a day (QID) | ORAL | Status: DC | PRN

## 2024-12-02 MED ORDER — ASPIRIN 81 MG PO TBEC
81.0000 mg | DELAYED_RELEASE_TABLET | Freq: Every day | ORAL | Status: DC
  Administered 2024-12-03: 81 mg via ORAL
  Filled 2024-12-02: qty 1

## 2024-12-02 MED ORDER — ONDANSETRON HCL 4 MG PO TABS: 4.0000 mg | ORAL_TABLET | Freq: Four times a day (QID) | ORAL | Status: DC | PRN

## 2024-12-02 NOTE — ED Provider Notes (Signed)
 Procedures     ----------------------------------------- 5:09 PM on 12/02/2024 -----------------------------------------  MRI shows areas of subacute and acute infarct.  Negative for LVO symptoms.  Will need to admit for further stroke evaluation.    Viviann Pastor, MD 12/02/24 1710

## 2024-12-02 NOTE — ED Provider Notes (Signed)
 [facility] Provider Note    Event Date/Time   First MD Initiated Contact with Patient 12/02/24 1136     (approximate)   History   Transient Ischemic Attack   HPI  Kristen Lloyd is a 83 y.o. female with prior stroke on 11/05/2024 currently on aspirin  who comes in with tingling to the right lips and not feeling good.  I reviewed the note from this hospital admission where patient had a acute infarct of the right temporal periventricular white matter patient had neurology consult and echocardiogram was unremarkable  Patient reports that she woke up around 4 AM with some acid like reflux symptoms which she reports is her baseline to occasionally get this.  She then woke up at 6 AM and just did not feel like her normal self with some right sided tingling on her lips.  She reports having this previously before she was told that she had had a stroke and she was worried that this was another stroke was coming.  She reports the tingling was around her right side of her mouth.  She reports symptoms are mostly resolved she just still feels a little off.  She denies any other chest pain, shortness of breath or any other concerns.   Physical Exam   Triage Vital Signs: ED Triage Vitals  Encounter Vitals Group     BP 12/02/24 1049 (!) 169/90     Girls Systolic BP Percentile --      Girls Diastolic BP Percentile --      Boys Systolic BP Percentile --      Boys Diastolic BP Percentile --      Pulse Rate 12/02/24 1049 86     Resp 12/02/24 1049 18     Temp 12/02/24 1049 98 F (36.7 C)     Temp Source 12/02/24 1049 Oral     SpO2 12/02/24 1049 97 %     Weight 12/02/24 1055 140 lb 6.9 oz (63.7 kg)     Height 12/02/24 1055 5' (1.524 m)     Head Circumference --      Peak Flow --      Pain Score 12/02/24 1054 0     Pain Loc --      Pain Education --      Exclude from Growth Chart --     Most recent vital signs: Vitals:   12/02/24 1049  BP: (!) 169/90  Pulse: 86   Resp: 18  Temp: 98 F (36.7 C)  SpO2: 97%     General: Awake, no distress.  CV:  Good peripheral perfusion.  Resp:  Normal effort.  Abd:  No distention.  Soft and nontender Other:  No swelling in legs.  No calf tenderness cranial nerves appear intact.   ED Results / Procedures / Treatments   Labs (all labs ordered are listed, but only abnormal results are displayed) Labs Reviewed  CBC - Abnormal; Notable for the following components:      Result Value   MCV 101.2 (*)    All other components within normal limits  COMPREHENSIVE METABOLIC PANEL WITH GFR - Abnormal; Notable for the following components:   Glucose, Bld 104 (*)    All other components within normal limits  PROTIME-INR  APTT  DIFFERENTIAL  ETHANOL  CBG MONITORING, ED     EKG  My interpretation of EKG:  Normal sinus rate of 80 without any ST elevation or T wave inversions, QTc of 454  RADIOLOGY I  have reviewed the xray personally and interpreted no pna   PROCEDURES:  Critical Care performed: No  .1-3 Lead EKG Interpretation  Performed by: Ernest Ronal BRAVO, MD Authorized by: Ernest Ronal BRAVO, MD     Interpretation: normal     ECG rate:  80   ECG rate assessment: normal     Rhythm: sinus rhythm     Ectopy: none     Conduction: normal      MEDICATIONS ORDERED IN ED: Medications  LORazepam  (ATIVAN ) injection 0.5 mg (has no administration in time range)     IMPRESSION / MDM / ASSESSMENT AND PLAN / ED COURSE  I reviewed the triage vital signs and the nursing notes.   Patient's presentation is most consistent with acute presentation with potential threat to life or bodily function.    Patient's last known well of the window for stroke code and patient's symptoms are improving and nearly resolved.  Therefore stroke code was not called.  Workup was ordered from triage to evaluate for intercranial hemorrhage, Electra abnormalities, AKI.  Given this reported some acid reflux symptoms I did order  EKG, cardiac markers just to ensure there is no evidence of ACS that would make her feel off and just be an atypical presentation.  Patient is alcohol is negative.  Coags reassuring.  CBC reassuring.  CMP reassuring  I reviewed the note from neurology: Preventive treatment: Aspirin  81 only. She has small area of what looks like an old subarachnoid hemorrhage. I would not want to do dual antiplatelets as the risks of bleeding might outweigh any benefits at this time.    IMPRESSION: 1. No acute intracranial abnormality. 2. Stable patchy hypodensity in supratentorial white matter, likely reflecting sequela of underlying chronic small-vessel ischemic change. 3. Unchanged remote infarcts in right cerebellum.  Discussed the case with neurology Dr. Matthews and if MRI is negative agrees with only continuing aspirin  and not adding on Plavix  due to concerns of the above.  She can follow-up outpatient with her neurologist.  Did discuss this with patient who reports difficulties getting in with her neurologist outpatient and did state with patient that she could try calling again stating that she needs an ER follow-up but at this time given symptoms are very minimal and is unclear if this is actually a TIA and if MRI is negative the potential risk for Plavix  would still outweigh the benefit and so we would want to hold off on it at this time.  Especially if she reports having episodes like this previously are not even sure if this represents a TIA.    Patient ordered some Ativan  to help facilitate MRI  Initial troponin is slightly elevated therefore we will get a repeat.  Pt handed off pending MRI/trop   The patient is on the cardiac monitor to evaluate for evidence of arrhythmia and/or significant heart rate changes.      FINAL CLINICAL IMPRESSION(S) / ED DIAGNOSES   Final diagnoses:  Tingling     Rx / DC Orders   ED Discharge Orders     None        Note:  This document was prepared  using Dragon voice recognition software and may include unintentional dictation errors.   Ernest Ronal BRAVO, MD 12/02/24 [phone]

## 2024-12-02 NOTE — H&P (Addendum)
 History and Physical    Kristen Lloyd FMW:[id number] DOB: [birth year] DOA: 12/02/2024  DOS: the patient was seen and examined on 12/02/2024  PCP: Epifanio Alm SQUIBB, MD   Patient coming from: Home  I have personally briefly reviewed patient's old medical records in [facility] and CareEverywhere  HPI:   Kristen Lloyd is a 83 y.o. year old female with medical history of hypertension, uncontrolled hyperlipidemia, previous stroke and TIA presenting to the ED with facial tingling.  Chart review shows patient was recently admitted for acute stroke 1 month ago.  She was discharged with cardiac monitor but states that started beeping after 5-7 days and she went to her PCP and it was determined that it had malfunctioned. At her cardiology follow up the monitor was sent it for analysis.   She states she was started on a statin but was only able to tolerate it due to GI side effects. Her PCP sent in crestor  that she has been unable to pick up.   On arrival to the ED patient was noted to be HDS stable.  Lab work and imaging performed.  CBC without leukocytosis and without anemia.  CMP overall unremarkable aside from mild hyperglycemia.  CT head did not show any acute findings but did show previous right cerebellar stroke but MRI was performed that showed new acute infarct in the left frontal lobe and subacute evolving right parietal-temporal lobe.  Given this, [facility] contacted for admission.  Review of Systems: As mentioned in the history of present illness. All other systems reviewed and are negative.   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   GERD (gastroesophageal reflux disease)    HTN (hypertension)    Hyperlipemia    Osteopenia    Palpitations    Primary osteoarthritis of left hip    Shingles    Spastic colon    Stroke (HCC) 11/05/2024   unsure how treated/which meds. seen here   TIA (transient ischemic attack)    TIA (transient ischemic attack) 06/03/2014    Past Surgical History:  Procedure Laterality Date   ABDOMINAL HYSTERECTOMY     APPENDECTOMY     BREAST BIOPSY Left 1990's   lt bx x 2-neg   CARDIAC CATHETERIZATION N/A 01/19/2017   Procedure: Left Heart Cath and Coronary Angiography;  Surgeon: Vinie DELENA Jude, MD;  Location: [facility] INVASIVE CV LAB;  Service: Cardiovascular;  Laterality: N/A;   CARDIAC CATHETERIZATION N/A 01/19/2017   Procedure: Coronary Stent Intervention;  Surgeon: Marsa Dooms, MD;  Location: [facility] INVASIVE CV LAB;  Service: Cardiovascular;  Laterality: N/A;   Ileostomy and reversal N/A 2021   TONSILLECTOMY     TOTAL HIP ARTHROPLASTY Left 05/13/2023   Procedure: TOTAL HIP ARTHROPLASTY;  Surgeon: Mardee Lynwood SQUIBB, MD;  Location: [facility] ORS;  Service: Orthopedics;  Laterality: Left;     Allergies  Allergen Reactions   Flagyl [Metronidazole]    Levofloxacin    Moxifloxacin    Septra [Sulfamethoxazole-Trimethoprim] Hives    Family History  Problem Relation Age of Onset  Breast cancer Cousin 72       mat cousin    Prior to Admission medications   Medication Sig Start Date End Date Taking? Authorizing Provider  aspirin  EC 81 MG tablet Take 1 tablet (81 mg total) by mouth daily. Swallow whole. 11/07/24 12/07/24  Lenon Marien CROME, MD  atorvastatin  (LIPITOR) 80 MG tablet Take 1 tablet (80 mg total) by mouth daily. 11/06/24 12/06/24  Lenon Marien CROME, MD  cholecalciferol (VITAMIN D) 1000 UNITS tablet Take 1,000 Units by mouth daily.    [provider]  diclofenac Sodium (VOLTAREN) 1 % GEL Apply 2 g topically in the morning and  at bedtime.    [provider]  fenofibrate  40 MG TABS Take 2 tablets (80 mg total) by mouth daily. 11/06/24   Lenon Marien CROME, MD  fluticasone  (FLONASE ) 50 MCG/ACT nasal spray Place 1 spray into both nostrils 2 (two) times daily. 06/18/18 11/05/24  Betancourt, Ellouise LABOR, NP  traMADol  (ULTRAM ) 50 MG tablet Take 1 tablet (50 mg total) by mouth every 4 (four) hours as needed for moderate pain. 05/14/23   Verlinda Boas, PA-C    Social History:  reports that she has never smoked. She has never used smokeless tobacco. She reports current alcohol use. She reports that she does not use drugs.   Physical Exam: Vitals:   12/02/24 1415 12/02/24 1430 12/02/24 1500 12/02/24 1756  BP: (!) 179/89 (!) 157/100 (!) 154/92   Pulse: 87 90 94   Resp: 15 (!) 21 19   Temp:    99.1 F (37.3 C)  TempSrc:    Oral  SpO2: 97% 98% 98%   Weight:      Height:       Physical Exam General: NAD HENT: NCAT Lungs: CTAB, no wheeze, rhonchi or rales.  Cardiovascular: RRR, No LE edema Abdomen: No TTP, normal bowel sounds MSK: No asymmetry or muscle atrophy.  Skin: no lesions noted on exposed skin Neuro: Alert and oriented x4. CN II-XII intact, strength is 5/5 in BUE and BLE. Sensation is intact. Heel to shin normal.  Psych: Normal mood and normal affect   Labs on Admission: I have personally reviewed following labs and imaging studies  CBC: Recent Labs  Lab 12/02/24 1057  WBC 5.4  NEUTROABS 3.6  HGB 13.5  HCT 40.7  MCV 101.2*  PLT 186   Basic Metabolic Panel: Recent Labs  Lab 12/02/24 1057 12/02/24 1300  NA 141  --   K 4.1  --   CL 104  --   CO2 25  --   GLUCOSE 104*  --   BUN 12  --   CREATININE 0.69  --   CALCIUM  9.2  --   MG  --  2.1   GFR: Estimated Creatinine Clearance: 42.9 mL/min (by C-G formula based on SCr of 0.69 mg/dL). Liver Function Tests: Recent Labs  Lab 12/02/24 1057  AST 20  ALT 11  ALKPHOS 113  BILITOT 0.6  PROT 6.7  ALBUMIN 4.2   No results for input(s):  LIPASE, AMYLASE in the last 168 hours. No results for input(s): AMMONIA in the last 168 hours. Coagulation Profile: Recent Labs  Lab 12/02/24 1057  INR 0.9   Cardiac Enzymes: No results for input(s): CKTOTAL, CKMB, CKMBINDEX, TROPONINI, TROPONINIHS in the last 168 hours. BNP (last 3 results) No results for input(s): BNP in the last 8760 hours. HbA1C: No results for input(s): HGBA1C in the last 72 hours. CBG: No results for input(s): GLUCAP in  the last 168 hours. Lipid Profile: Recent Labs    12/02/24 1430  CHOL 240*  HDL 69  LDLCALC 152*  TRIG 97  CHOLHDL 3.5   Thyroid  Function Tests: No results for input(s): TSH, T4TOTAL, FREET4, T3FREE, THYROIDAB in the last 72 hours. Anemia Panel: No results for input(s): VITAMINB12, FOLATE, FERRITIN, TIBC, IRON, RETICCTPCT in the last 72 hours. Urine analysis:    Component Value Date/Time   COLORURINE YELLOW (A) 11/05/2024 1021   APPEARANCEUR CLEAR (A) 11/05/2024 1021   APPEARANCEUR HAZY 06/17/2014 0821   LABSPEC 1.008 11/05/2024 1021   LABSPEC 1.025 06/17/2014 0821   PHURINE 8.0 11/05/2024 1021   GLUCOSEU NEGATIVE 11/05/2024 1021   GLUCOSEU NEGATIVE 06/17/2014 0821   HGBUR NEGATIVE 11/05/2024 1021   BILIRUBINUR NEGATIVE 11/05/2024 1021   BILIRUBINUR NEGATIVE 06/17/2014 0821   KETONESUR NEGATIVE 11/05/2024 1021   PROTEINUR NEGATIVE 11/05/2024 1021   NITRITE NEGATIVE 11/05/2024 1021   LEUKOCYTESUR TRACE (A) 11/05/2024 1021   LEUKOCYTESUR 1+ 06/17/2014 0821    Radiological Exams on Admission: I have personally reviewed images US  Venous Img Lower Bilateral (DVT) Result Date: 12/02/2024 EXAM: ULTRASOUND DUPLEX OF THE BILATERAL LOWER EXTREMITY VEINS TECHNIQUE: Duplex ultrasound using B-mode/gray scaled imaging and Doppler spectral analysis and color flow was obtained of the deep venous structures of the bilateral lower extremity. COMPARISON: None available. CLINICAL HISTORY: [id number]  Cerebral infarction [facility]) [id number] Cerebral infarction [facility]) FINDINGS: LEFT: The common femoral vein, femoral vein, popliteal vein, and posterior tibial vein of the left lower extremity demonstrate normal compressibility with normal color flow and spectral analysis. RIGHT: The common femoral vein, femoral vein, popliteal vein, and posterior tibial vein of the right lower extremity demonstrate normal compressibility with normal color flow and spectral analysis. IMPRESSION: 1. No evidence of deep venous thrombosis in either lower extremity. Electronically signed by: Franky Crease MD 12/02/2024 07:14 PM EST RP Workstation: [id number]   MR BRAIN WO CONTRAST Result Date: 12/02/2024 EXAM: MRI Brain Without Contrast 12/02/2024 04:10:37 PM TECHNIQUE: Multiplanar multisequence MRI of the head/brain was performed without the administration of intravenous contrast. COMPARISON: None available. CLINICAL HISTORY: Stroke, follow up Stroke, follow up FINDINGS: BRAIN AND VENTRICLES: New/interval punctate acute infarct in the left frontal lobe (series 5 and 6, image 38). Subacute evolving right periatrial temporal lobe infarct. No acute intracranial hemorrhage. No mass. No midline shift. No hydrocephalus. Susceptibility along the sulci in the right occipital lobe is suggestive of prior subarachnoid hemorrhage. Remote lacunar infarcts the left thalamus, left corona radiata and cerebellum. Moderate supratentorial white matter signal abnormalities, compatible with chronic microvascular ischemic changes. Normal flow voids. ORBITS: No acute abnormality. SINUSES AND MASTOIDS: No acute abnormality. BONES AND SOFT TISSUES: Normal marrow signal. No acute soft tissue abnormality. IMPRESSION: 1. New/interval punctate acute infarct in the left frontal lobe. 2. Subacute evolving right periatrial temporal lobe infarct. 3. Many remote lacunar infarcts, as above. Electronically signed by: Gilmore Molt MD 12/02/2024 04:25 PM EST RP Workstation:  [id number]   DG Chest 2 View Result Date: 12/02/2024 EXAM: 2 VIEW(S) XRAY OF THE CHEST 12/02/2024 12:52:00 PM COMPARISON: 11/05/2024 CLINICAL HISTORY: sob sob FINDINGS: LUNGS AND PLEURA: No focal pulmonary opacity. No pleural effusion. No pneumothorax. HEART AND MEDIASTINUM: No acute abnormality of the cardiac and mediastinal silhouettes. BONES AND SOFT TISSUES: No acute osseous abnormality. IMPRESSION: 1. No acute cardiopulmonary process. Electronically signed by: Lynwood Seip MD 12/02/2024 01:08 PM EST RP Workstation: [id number]   CT HEAD WO CONTRAST Result Date: 12/02/2024 EXAM: CT HEAD WITHOUT CONTRAST 12/02/2024 12:06:37 PM  TECHNIQUE: CT of the head was performed without the administration of intravenous contrast. Automated exposure control, iterative reconstruction, and/or weight based adjustment of the mA/kV was utilized to reduce the radiation dose to as low as reasonably achievable. COMPARISON: 11/05/2024 CLINICAL HISTORY: Transient ischemic attack (TIA) FINDINGS: BRAIN AND VENTRICLES: No acute hemorrhage. No evidence of acute infarct. No hydrocephalus. No extra-axial collection. No mass effect or midline shift. Stable patchy hypodensity in supratentorial white matter, likely reflecting sequela of underlying chronic small-vessel ischemic change. Unchanged remote infarcts in right cerebellum. Calcification of bilateral carotid siphons and vertebral arteries. ORBITS: No acute abnormality. SINUSES: Mild mucosal thickening in right sphenoid sinus. SOFT TISSUES AND SKULL: No acute soft tissue abnormality. No skull fracture. IMPRESSION: 1. No acute intracranial abnormality. 2. Stable patchy hypodensity in supratentorial white matter, likely reflecting sequela of underlying chronic small-vessel ischemic change. 3. Unchanged remote infarcts in right cerebellum. Electronically signed by: Evalene Coho MD 12/02/2024 12:19 PM EST RP Workstation: [id number]    EKG: My personal interpretation of EKG shows:  Normal sinus rhythm without any acute ST changes.  Assessment/Plan Principal Problem:   Cerebral infarction [facility]) Active Problems:   CAD (coronary artery disease)   HTN (hypertension)   Hyperlipidemia   Pt with cerebral infarction who is back to baseline. Initial concern was for TIA but imaging showed acute stroke. She doesn't any deficits and is outside of any acute interventions. Case discussed with neurology who stated she may not benefit from further stroke work up but they will be happy to see patient again. Appreciate their recommendations. I ordered DVT study and plan for echo with bubble study but per neurology no need for repeat echo given one was performed last admission. Appreciate neurology recommendations. Will await further recommendations. Will start pt on 20 mg Crestor  and plan to uptitrate to 40 mg if she tolerates it. Allow permission htn in setting of acute stroke. She had long term cardiac monitor ordered but was appears to have been defective. Will await further neurology recommendation on continued need for this vs loop recorder.    HTN: allowing permissive htn. Treat with prn if systolic>220 or diastolic >120   Troponin elevation: mild. No chest pain reported. May be secondary to elevated BP as she was quite hypertensive on presentation.     VTE prophylaxis:  SQ Heparin   Diet: N.p.o. Code Status:  Full Code Telemetry:  Admission status: Observation, Telemetry bed Patient is from: Home Anticipated d/c is to: Home Anticipated d/c is in: 1-2 days   Family Communication: Updated at bedside  Consults called: EDP spoke with Dr. Matthews from neurology.   Severity of Illness: The appropriate patient status for this patient is OBSERVATION. Observation status is judged to be reasonable and necessary in order to provide the required intensity of service to ensure the patient's safety. The patient's presenting symptoms, physical exam findings, and initial radiographic and  laboratory data in the context of their medical condition is felt to place them at decreased risk for further clinical deterioration. Furthermore, it is anticipated that the patient will be medically stable for discharge from the hospital within 2 midnights of admission.    Morene Bathe, MD [facility] DEL. [facility]

## 2024-12-02 NOTE — ED Triage Notes (Signed)
 Pt to ED for stroke like symptoms since 6am. Was having tingling to R lips and not feeling good (cannot describe, states was not nauseous or dizzy) and is unsure how long it lasted. Pt has no facial droop. No arm drift noted. Voice is clear. Pt states she had a stroke on 11-05-24. Pt is on 81mg  aspirin , no other thinners.

## 2024-12-03 DIAGNOSIS — R202 Paresthesia of skin: Secondary | ICD-10-CM | POA: Diagnosis not present

## 2024-12-03 DIAGNOSIS — I639 Cerebral infarction, unspecified: Secondary | ICD-10-CM | POA: Diagnosis not present

## 2024-12-03 DIAGNOSIS — I6389 Other cerebral infarction: Secondary | ICD-10-CM | POA: Diagnosis not present

## 2024-12-03 LAB — BASIC METABOLIC PANEL WITH GFR
Anion gap: 10 (ref 5–15)
BUN: 10 mg/dL (ref 8–23)
CO2: 23 mmol/L (ref 22–32)
Calcium: 9 mg/dL (ref 8.9–10.3)
Chloride: 109 mmol/L (ref 98–111)
Creatinine, Ser: 0.65 mg/dL (ref 0.44–1.00)
GFR, Estimated: >60 mL/min (ref >60)
Glucose, Bld: 107 mg/dL — ABNORMAL HIGH (ref 70–99)
Potassium: 3.9 mmol/L (ref 3.5–5.1)
Sodium: 142 mmol/L (ref 135–145)

## 2024-12-03 LAB — CBC
HCT: 38.3 % (ref 36.0–46.0)
Hemoglobin: 12.8 g/dL (ref 12.0–15.0)
MCH: 33.4 pg (ref 26.0–34.0)
MCHC: 33.4 g/dL (ref 30.0–36.0)
MCV: 100 fL (ref 80.0–100.0)
Platelets: 166 K/uL (ref 150–400)
RBC: 3.83 MIL/uL — ABNORMAL LOW (ref 3.87–5.11)
RDW: 14.4 % (ref 11.5–15.5)
WBC: 4.9 K/uL (ref 4.0–10.5)
nRBC: 0 % (ref 0.0–0.2)

## 2024-12-03 MED ORDER — ROSUVASTATIN CALCIUM 20 MG PO TABS: 20.0000 mg | ORAL_TABLET | Freq: Every day | ORAL | 0 refills | Status: AC

## 2024-12-03 MED ORDER — CLOPIDOGREL BISULFATE 75 MG PO TABS: 75.0000 mg | ORAL_TABLET | Freq: Every day | ORAL | 0 refills | Status: AC

## 2024-12-03 MED ORDER — ALUM & MAG HYDROXIDE-SIMETH 200-200-20 MG/5ML PO SUSP: 15.0000 mL | Freq: Once | ORAL | Status: DC

## 2024-12-03 NOTE — Discharge Summary (Signed)
 Physician Discharge Summary  Patient: Kristen Lloyd FMW:[id number] DOB: September 03, 1941   Code Status: Full Code Admit date: 12/02/2024 Discharge date: 12/03/2024 Disposition: Home health, PT and OT PCP: Epifanio Alm SQUIBB, MD  Recommendations for Outpatient Follow-up:  Follow up with PCP within 1-2 weeks Regarding general hospital follow up and preventative care Recommend glucose monitoring Follow up with cardiology to review cardiac monitor results. Suspect occult arrhythmia present Follow up with neurology   Discharge Diagnoses:  Principal Problem:   Cerebral infarction [facility]) Active Problems:   CAD (coronary artery disease)   HTN (hypertension)   Hyperlipidemia   Tingling   Acute ischemic stroke [facility])  Brief Hospital Course Summary: Kristen Lloyd is a 83 y.o. year old female with medical history of hypertension, uncontrolled hyperlipidemia, previous stroke and TIA presenting to the ED with facial tingling. It has resolved prior to admission. She endorses only generalized weakness at this time.   Workup revealed an acute infarct on brain MRI in the left frontal lobe and subacute evolving right parietal-temporal lobe. Neurology was consulted and workup was limited due to the recent stroke workup just completed.  Unfortunately, her cardiac monitor that she was just discharged with didn't function so didn't have data to collect.  She was given a new monitor at discharge this time and continued on plavix  and statin.  She will follow up with cardiology and neurology for further stroke prevention.  PT and OT evaluated and recommended HH.   All other chronic conditions were treated with home medications.    Discharge Condition: Good, improved Recommended discharge diet: Regular healthy diet  Consultations: Neurology  Cardiology placed monitor  Procedures/Studies: Brain MRI  Allergies as of 12/03/2024       Reactions   Flagyl [metronidazole]    Levofloxacin    Moxifloxacin     Septra [sulfamethoxazole-trimethoprim] Hives        Medication List     STOP taking these medications    aspirin  EC 81 MG tablet   atorvastatin  80 MG tablet Commonly known as: LIPITOR   Fenofibrate  40 MG Tabs       TAKE these medications    cholecalciferol 1000 units tablet Commonly known as: VITAMIN D Take 1,000 Units by mouth daily.   clopidogrel  75 MG tablet Commonly known as: Plavix  Take 1 tablet (75 mg total) by mouth daily. Start taking on: December 04, 2024   diclofenac Sodium 1 % Gel Commonly known as: VOLTAREN Apply 2 g topically in the morning and at bedtime.   fluticasone  50 MCG/ACT nasal spray Commonly known as: FLONASE  Place 1 spray into both nostrils 2 (two) times daily.   omeprazole  40 MG capsule Commonly known as: PRILOSEC Take 40 mg by mouth daily.   rosuvastatin  20 MG tablet Commonly known as: CRESTOR  Take 1 tablet (20 mg total) by mouth daily. Start taking on: December 04, 2024 What changed:  medication strength how much to take when to take this   traMADol  50 MG tablet Commonly known as: ULTRAM  Take 50 mg by mouth at bedtime as needed.        Follow-up Information     Epifanio Alm SQUIBB, MD. Go in 1 week(s).   Specialty: Infectious Diseases Why: no answer on hold 12 minutes  Patient to make own follow up appt Contact information: [address] [place] [address] [phone]                Subjective   Pt reports feeling some generalized weakness  but nothing focal. Her facial numbness has resolved.   All questions and concerns were addressed at time of discharge.  Objective  Blood pressure (!) 150/73, pulse 72, temperature 98.2 F (36.8 C), temperature source Oral, resp. rate 18, height 5' (1.524 m), weight 65.8 kg, SpO2 97%.   General: Pt is alert, awake, not in acute distress Cardiovascular: RRR, S1/S2 +, no rubs, no gallops Respiratory: CTA bilaterally, no wheezing, no rhonchi Abdominal: Soft,  NT, ND, bowel sounds + Extremities: no edema, no cyanosis  The results of significant diagnostics from this hospitalization (including imaging, microbiology, ancillary and laboratory) are listed below for reference.   Imaging studies: US  Venous Img Lower Bilateral (DVT) Result Date: 12/02/2024 EXAM: ULTRASOUND DUPLEX OF THE BILATERAL LOWER EXTREMITY VEINS TECHNIQUE: Duplex ultrasound using B-mode/gray scaled imaging and Doppler spectral analysis and color flow was obtained of the deep venous structures of the bilateral lower extremity. COMPARISON: None available. CLINICAL HISTORY: [id number] Cerebral infarction [facility]) [id number] Cerebral infarction [facility]) FINDINGS: LEFT: The common femoral vein, femoral vein, popliteal vein, and posterior tibial vein of the left lower extremity demonstrate normal compressibility with normal color flow and spectral analysis. RIGHT: The common femoral vein, femoral vein, popliteal vein, and posterior tibial vein of the right lower extremity demonstrate normal compressibility with normal color flow and spectral analysis. IMPRESSION: 1. No evidence of deep venous thrombosis in either lower extremity. Electronically signed by: Franky Crease MD 12/02/2024 07:14 PM EST RP Workstation: [id number]   MR BRAIN WO CONTRAST Result Date: 12/02/2024 EXAM: MRI Brain Without Contrast 12/02/2024 04:10:37 PM TECHNIQUE: Multiplanar multisequence MRI of the head/brain was performed without the administration of intravenous contrast. COMPARISON: None available. CLINICAL HISTORY: Stroke, follow up Stroke, follow up FINDINGS: BRAIN AND VENTRICLES: New/interval punctate acute infarct in the left frontal lobe (series 5 and 6, image 38). Subacute evolving right periatrial temporal lobe infarct. No acute intracranial hemorrhage. No mass. No midline shift. No hydrocephalus. Susceptibility along the sulci in the right occipital lobe is suggestive of prior subarachnoid hemorrhage. Remote lacunar infarcts the left  thalamus, left corona radiata and cerebellum. Moderate supratentorial white matter signal abnormalities, compatible with chronic microvascular ischemic changes. Normal flow voids. ORBITS: No acute abnormality. SINUSES AND MASTOIDS: No acute abnormality. BONES AND SOFT TISSUES: Normal marrow signal. No acute soft tissue abnormality. IMPRESSION: 1. New/interval punctate acute infarct in the left frontal lobe. 2. Subacute evolving right periatrial temporal lobe infarct. 3. Many remote lacunar infarcts, as above. Electronically signed by: Gilmore Molt MD 12/02/2024 04:25 PM EST RP Workstation: [id number]   DG Chest 2 View Result Date: 12/02/2024 EXAM: 2 VIEW(S) XRAY OF THE CHEST 12/02/2024 12:52:00 PM COMPARISON: 11/05/2024 CLINICAL HISTORY: sob sob FINDINGS: LUNGS AND PLEURA: No focal pulmonary opacity. No pleural effusion. No pneumothorax. HEART AND MEDIASTINUM: No acute abnormality of the cardiac and mediastinal silhouettes. BONES AND SOFT TISSUES: No acute osseous abnormality. IMPRESSION: 1. No acute cardiopulmonary process. Electronically signed by: Lynwood Seip MD 12/02/2024 01:08 PM EST RP Workstation: [id number]   CT HEAD WO CONTRAST Result Date: 12/02/2024 EXAM: CT HEAD WITHOUT CONTRAST 12/02/2024 12:06:37 PM TECHNIQUE: CT of the head was performed without the administration of intravenous contrast. Automated exposure control, iterative reconstruction, and/or weight based adjustment of the mA/kV was utilized to reduce the radiation dose to as low as reasonably achievable. COMPARISON: 11/05/2024 CLINICAL HISTORY: Transient ischemic attack (TIA) FINDINGS: BRAIN AND VENTRICLES: No acute hemorrhage. No evidence of acute infarct. No hydrocephalus. No extra-axial collection. No mass effect or midline shift. Stable patchy hypodensity  in supratentorial white matter, likely reflecting sequela of underlying chronic small-vessel ischemic change. Unchanged remote infarcts in right cerebellum. Calcification of  bilateral carotid siphons and vertebral arteries. ORBITS: No acute abnormality. SINUSES: Mild mucosal thickening in right sphenoid sinus. SOFT TISSUES AND SKULL: No acute soft tissue abnormality. No skull fracture. IMPRESSION: 1. No acute intracranial abnormality. 2. Stable patchy hypodensity in supratentorial white matter, likely reflecting sequela of underlying chronic small-vessel ischemic change. 3. Unchanged remote infarcts in right cerebellum. Electronically signed by: Evalene Coho MD 12/02/2024 12:19 PM EST RP Workstation: [id number]   ECHOCARDIOGRAM COMPLETE Result Date: 11/06/2024    ECHOCARDIOGRAM REPORT   Patient Name:   Kristen Lloyd Date of Exam: 11/06/2024 Medical Rec #:  [id number]      Height:       60.0 in Accession #:    [id number]     Weight:       140.4 lb Date of Birth:  [birth year]      BSA:          1.606 m Patient Age:    83 years       BP:           141/51 mmHg Patient Gender: F              HR:           72 bpm. Exam Location:  [facility] Procedure: 2D Echo, Cardiac Doppler, Color Doppler and Intracardiac            Opacification Agent (Both Spectral and Color Flow Doppler were            utilized during procedure). Indications:     Stroke I63.9  History:         Patient has no prior history of Echocardiogram examinations.                  Stroke.  Sonographer:     Ashley McNeely-Sloane Referring Phys:  [id number] [facility] PAUDEL Diagnosing Phys: Marsa Dooms MD IMPRESSIONS  1. Left ventricular ejection fraction, by estimation, is 55 to 60%. The left ventricle has normal function. The left ventricle has no regional wall motion abnormalities. Left ventricular diastolic parameters are consistent with Grade I diastolic dysfunction (impaired relaxation).  2. Right ventricular systolic function is normal. The right ventricular size is normal.  3. The mitral valve is normal in structure. Mild mitral valve regurgitation. No evidence of mitral stenosis.  4. The aortic valve is normal in  structure. Aortic valve regurgitation is not visualized. No aortic stenosis is present.  5. The inferior vena cava is normal in size with greater than 50% respiratory variability, suggesting right atrial pressure of 3 mmHg. FINDINGS  Left Ventricle: Left ventricular ejection fraction, by estimation, is 55 to 60%. The left ventricle has normal function. The left ventricle has no regional wall motion abnormalities. Definity  contrast agent was given IV to delineate the left ventricular  endocardial borders. Strain was performed and the global longitudinal strain is indeterminate. The left ventricular internal cavity size was normal in size. There is no left ventricular hypertrophy. Left ventricular diastolic parameters are consistent with Grade I diastolic dysfunction (impaired relaxation). Right Ventricle: The right ventricular size is normal. No increase in right ventricular wall thickness. Right ventricular systolic function is normal. Left Atrium: Left atrial size was normal in size. Right Atrium: Right atrial size was normal in size. Pericardium: There is no evidence of pericardial effusion. Mitral Valve: The mitral valve is normal in structure. Mild  mitral valve regurgitation. No evidence of mitral valve stenosis. MV peak gradient, 5.5 mmHg. The mean mitral valve gradient is 3.0 mmHg. Tricuspid Valve: The tricuspid valve is normal in structure. Tricuspid valve regurgitation is mild . No evidence of tricuspid stenosis. Aortic Valve: The aortic valve is normal in structure. Aortic valve regurgitation is not visualized. No aortic stenosis is present. Aortic valve mean gradient measures 3.0 mmHg. Aortic valve peak gradient measures 6.0 mmHg. Aortic valve area, by VTI measures 2.14 cm. Pulmonic Valve: The pulmonic valve was normal in structure. Pulmonic valve regurgitation is not visualized. No evidence of pulmonic stenosis. Aorta: The aortic root is normal in size and structure. Venous: The inferior vena cava is  normal in size with greater than 50% respiratory variability, suggesting right atrial pressure of 3 mmHg. IAS/Shunts: No atrial level shunt detected by color flow Doppler. Additional Comments: 3D was performed not requiring image post processing on an independent workstation and was indeterminate.  LEFT VENTRICLE PLAX 2D LVIDd:         4.20 cm     Diastology LVIDs:         2.00 cm     LV e' medial:    5.33 cm/s LV PW:         1.20 cm     LV E/e' medial:  12.6 LV IVS:        1.00 cm     LV e' lateral:   5.55 cm/s LVOT diam:     1.70 cm     LV E/e' lateral: 12.1 LV SV:         59 LV SV Index:   37 LVOT Area:     2.27 cm  LV Volumes (MOD) LV vol d, MOD A2C: 62.3 ml LV vol d, MOD A4C: 63.8 ml LV vol s, MOD A2C: 13.9 ml LV vol s, MOD A4C: 20.1 ml LV SV MOD A2C:     48.3 ml LV SV MOD A4C:     63.8 ml LV SV MOD BP:      46.5 ml RIGHT VENTRICLE             IVC RV S prime:     12.70 cm/s  IVC diam: 1.55 cm LEFT ATRIUM         Index LA diam:    3.30 cm 2.05 cm/m  AORTIC VALVE                    PULMONIC VALVE AV Area (Vmax):    2.33 cm     PV Vmax:        0.99 m/s AV Area (Vmean):   2.15 cm     PV Vmean:       67.600 cm/s AV Area (VTI):     2.14 cm     PV VTI:         0.213 m AV Vmax:           122.00 cm/s  PV Peak grad:   3.9 mmHg AV Vmean:          84.500 cm/s  PV Mean grad:   2.0 mmHg AV VTI:            0.275 m      RVOT Peak grad: 1 mmHg AV Peak Grad:      6.0 mmHg AV Mean Grad:      3.0 mmHg LVOT Vmax:         125.00 cm/s LVOT Vmean:  80.200 cm/s LVOT VTI:          0.259 m LVOT/AV VTI ratio: 0.94  AORTA Ao Root diam: 3.00 cm Ao Asc diam:  2.50 cm MITRAL VALVE MV Area (PHT): 3.42 cm    SHUNTS MV Area VTI:   2.75 cm    Systemic VTI:  0.26 m MV Peak grad:  5.5 mmHg    Systemic Diam: 1.70 cm MV Mean grad:  3.0 mmHg    Pulmonic VTI:  0.130 m MV Vmax:       1.17 m/s MV Vmean:      78.2 cm/s MV Decel Time: 222 msec MV E velocity: 67.30 cm/s MV A velocity: 94.70 cm/s MV E/A ratio:  0.71 Marsa Dooms MD  Electronically signed by Marsa Dooms MD Signature Date/Time: 11/06/2024/9:43:23 AM    Final    CT ANGIO HEAD NECK W WO CM Result Date: 11/05/2024 EXAM: CTA HEAD AND NECK WITHOUT AND WITH 11/05/2024 06:10:09 PM TECHNIQUE: CTA of the head and neck was performed without and with the administration of 75 mL of iohexol  (OMNIPAQUE ) 350 MG/ML injection. Multiplanar 2D and/or 3D reformatted images are provided for review. Automated exposure control, iterative reconstruction, and/or weight based adjustment of the mA/kV was utilized to reduce the radiation dose to as low as reasonably achievable. Stenosis of the internal carotid arteries measured using NASCET criteria. COMPARISON: Same day CT head and MRI head. CLINICAL HISTORY: Neuro deficit, acute, stroke suspected. FINDINGS: CTA NECK: AORTIC ARCH AND ARCH VESSELS: Mild to moderate atherosclerosis of the visualized aortic arch. Atherosclerosis of the proximal right subclavian artery without significant stenosis. Limited evaluation of the proximal left subclavian artery due to streak artifact from dense venous contrast. No dissection or arterial injury. CERVICAL CAROTID ARTERIES: The right carotid artery is patent from the origin to the skull base. Mild tortuosity of the proximal right common carotid artery. Atherosclerosis at the right carotid bifurcation without hemodynamically significant stenosis. The left carotid artery is patent from the origin to the skull base. Atherosclerosis at the left carotid bifurcation without hemodynamically significant stenosis. No dissection or arterial injury. CERVICAL VERTEBRAL ARTERIES: Moderate to severe stenosis at the origin of the right vertebral artery. The vertebral arteries are relatively diminished in caliber but are patent from the origins to the V3 segments. The right vertebral artery primarily terminates in a muscular branch at the level of C1. There is a diminutive branch from the distal right V3 segment which  courses intracranially. The left vertebral artery terminates at the origin of the left PICA. No dissection or arterial injury. LUNGS AND MEDIASTINUM: Unremarkable. SOFT TISSUES: Nonspecific focus in the left vallecula measuring up to 6 mm which may reflect a mucous retention cyst. Recommend nonemergent correlation with direct visualization. Possible additional nodular focus in the right vallecula abutting the right pharyngoepiglottic fold. BONES: Prominent degenerative changes at the atlantodens and atlantoaxial articulations. Degenerative soft tissue along the dorsal aspect of the dens. There is mild basilar invagination noted likely related to degenerative changes. Additional degenerative changes throughout the cervical spine and upper thoracic spine with severe disc space narrowing at multiple levels. Periapical lucencies of multiple maxillary teeth. CTA HEAD: ANTERIOR CIRCULATION: Intracranial internal carotid arteries are patent bilaterally. Atherosclerosis of the carotid siphons resulting in mild stenosis bilaterally. The anterior cerebral arteries are patent bilaterally. The middle cerebral arteries are patent bilaterally. There is mild irregularity and narrowing of the proximal M1 segment of the left MCA. No aneurysm. POSTERIOR CIRCULATION: The basilar artery appears occluded proximally with  reconstitution near the level of the pontomedullary junction. The basilar artery is small in caliber and irregular with multifocal narrowing. The left PICA is supplied via the left vertebral artery. The right PICA is diminutive in caliber, appears patent, and is likely reconstituted via collaterals. The superior cerebellar arteries are patent bilaterally. Posterior communicating arteries noted bilaterally. Fetal origin of the left PCA. The right PCA is primarily supplied via the right posterior communicating artery with a small P1 segment also noted. There is mild to moderate narrowing of the P2 segment of the left PCA.  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 No aneurysm. OTHER: No acute intracranial hemorrhage. Redemonstration of numerous chronic infarcts within the bilateral cerebellum slightly more pronounced on the right. Chronic microvascular ischemic changes. Remote cortical infarcts in the frontal lobes better evaluated on same day MRI brain. Atherosclerosis at the skull base. No midline shift. The basilar cisterns are patent. Mucosal thickening in the right sphenoid sinus. No dural venous sinus thrombosis on this non-dedicated study. IMPRESSION: 1. No evidence of acute large vessel occlusion. 2. 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 3. Atherosclerosis at the skull base and carotid bifurcations without hemodynamically significant stenosis. 4. Moderate to severe stenosis at the origin of the right vertebral artery. 5. Prominent degenerative changes at the atlantodens and atlantoaxial articulations with degenerative soft tissue along the dorsal dens and mild basilar invagination. 6. Nonspecific focus in the left vallecula measuring up to 6 mm which may reflect a mucous retention cyst. Recommend nonemergent correlation with direct visualization. Electronically signed by: Donnice Mania MD 11/05/2024 06:51 PM EST RP Workstation: [id number]   MR BRAIN WO CONTRAST Result Date: 11/05/2024 EXAM: MRI BRAIN WITHOUT CONTRAST 11/05/2024 02:06:28 PM TECHNIQUE: Multiplanar multisequence MRI of the head/brain was performed without the administration of intravenous contrast. COMPARISON: Same day CT head. CLINICAL HISTORY: Transient ischemic attack (TIA). FINDINGS: BRAIN AND VENTRICLES: There is a 4 mm focus of acute infarct within the right temporal  periventricular white matter. Multiple remote infarcts are present in the bilateral cerebellum, more pronounced on the right. Remote cortical infarcts are noted in the bilateral frontal lobes. Susceptibility along the sulci in the right occipital lobe is suggestive of prior subarachnoid hemorrhage. A remote lacunar infarct is present in the left thalamus. A remote infarct is seen in the left corona radiata. Scattered supratentorial white matter signal abnormalities are suggestive of mild chronic microvascular ischemic changes. No acute intracranial hemorrhage. No mass. No midline shift. No hydrocephalus. The sella is unremarkable. Normal flow voids. ORBITS: No acute abnormality. SINUSES AND MASTOIDS: Mild mucosal thickening in the right sphenoid sinus. Trace fluid in the right mastoid tip. BONES AND SOFT TISSUES: Normal marrow signal. No acute soft tissue abnormality. Changes in the visualized upper cervical spine: Disc osteophyte complex at C3-C4 likely contributing to spinal canal narrowing. VISUALIZED LUNGS: Changes of the right and left pleural effusions are not obvious. IMPRESSION: 1. 4 mm focus of acute infarct within the right temporal periventricular white matter. 2. Multiple remote infarcts in the bilateral cerebellum, more pronounced on the right. 3. Remote cortical infarcts in the bilateral frontal lobes. Remote lacunar infarct in the left thalamus and the left corona radiata. 4. Susceptibility along the sulci in the right occipital lobe suggestive of prior subarachnoid hemorrhage. 5. Mild chronic microvascular  ischemic changes. 6. Disc osteophyte complex at C3-C4 likely contributing to spinal canal narrowing. Electronically signed by: Donnice Mania MD 11/05/2024 02:44 PM EST RP Workstation: [id number]   DG Knee Complete 4 Views Left Result Date: 11/05/2024 CLINICAL DATA:  Left knee pain after a fall. EXAM: LEFT KNEE - COMPLETE 4+ VIEW COMPARISON:  None Available. FINDINGS: No joint effusion or  fracture. Tricompartment osteophytosis. Medial joint space narrowing. IMPRESSION: 1. No acute findings. 2. Tricompartment osteoarthritis. Electronically Signed   By: Newell Eke M.D.   On: 11/05/2024 11:43   DG Hip Unilat W or Wo Pelvis 2-3 Views Left Result Date: 11/05/2024 CLINICAL DATA:  Fall with left hip pain. EXAM: DG HIP (WITH OR WITHOUT PELVIS) 2-3V LEFT COMPARISON:  None Available. FINDINGS: Left hip arthroplasty. No fracture or dislocation. Osteopenia. Degenerative changes in the spine. IMPRESSION: No acute findings. Electronically Signed   By: Newell Eke M.D.   On: 11/05/2024 11:38   DG Ribs Unilateral W/Chest Right Result Date: 11/05/2024 CLINICAL DATA:  Fall, right upper anterior rib pain. EXAM: RIGHT RIBS AND CHEST - 3+ VIEW COMPARISON:  10/27/2013. FINDINGS: Frontal view of the chest shows midline trachea. Heart size normal. Thoracic aorta is calcified. Lungs are mildly hyperinflated but clear. No pleural fluid. No pneumothorax. Dedicated views of the right ribs show no definite acute fracture. IMPRESSION: No acute findings. Electronically Signed   By: Newell Eke M.D.   On: 11/05/2024 11:37   CT Cervical Spine Wo Contrast Result Date: 11/05/2024 EXAM: CT CERVICAL SPINE WITHOUT CONTRAST 11/05/2024 11:02:00 AM TECHNIQUE: CT of the cervical spine was performed without the administration of intravenous contrast. Multiplanar reformatted images are provided for review. Automated exposure control, iterative reconstruction, and/or weight based adjustment of the mA/kV was utilized to reduce the radiation dose to as low as reasonably achievable. COMPARISON: Head CT today reported separately. CLINICAL HISTORY: Neck trauma (Age >= 65y). FINDINGS: CERVICAL SPINE: BONES AND ALIGNMENT: No acute fracture or traumatic malalignment. DEGENERATIVE CHANGES: Chronic severe cervical spine degeneration and degenerative appearing ankylosis of the C2-C3, C4 through C6 cervical levels. Similar  developing ankylosis in the lower cervical spine, and in the visible upper thoracic spine. Chronic severe C1-C2 degeneration superimposed with complete bilateral C1-C2 joint space loss, bulky osteophytosis, and sclerosis. Associated bulky ligamentous hypertrophy about the odontoid. Subsequent mild spinal stenosis at the cervicomedullary junction and C1 level. SOFT TISSUES: No prevertebral soft tissue swelling. Calcified cervical carotid atherosclerosis. Partially visible aortic arch calcified atherosclerosis. Otherwise negative visible non-contrast thoracic inlet. IMPRESSION: 1. No acute traumatic injury identified in the cervical spine. 2. 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 Very severe C1-C2 degeneration with spinal stenosis (probably mild) at the cervicomedullary junction and C1 level. Electronically signed by: Helayne Hurst MD 11/05/2024 11:14 AM EST RP Workstation: [id number]   CT HEAD WO CONTRAST ( ) Result Date: 11/05/2024 EXAM: CT HEAD WITHOUT CONTRAST 11/05/2024 11:02:00 AM TECHNIQUE: CT of the head was performed without the administration of intravenous contrast. Automated exposure control, iterative reconstruction, and/or weight based adjustment of the mA/kV was utilized to reduce the radiation dose to as low as reasonably achievable. COMPARISON: None available. CLINICAL HISTORY: 83 year old female status post fall, dizziness, T1. FINDINGS: BRAIN AND VENTRICLES: Brain volume within normal limits for age. Mix of circumscribed and indistinct hypodensity in the right cerebellum, in part chronic right SCA territory infarct with encephalomalacia, but difficult to exclude superimposed acute or subacute cerebellar infarct on that side. Contralateral small but circumscribed left cerebellar infarcts  appear chronic. Supratentorial gray white differentiation better maintained. Mild for age cerebral white matter hypodensity. Extensive calcified  atherosclerosis at the skull base. No suspicious intracranial vascular hyperdensity. No acute hemorrhage. No hydrocephalus. No extra-axial collection. No mass effect or midline shift. ORBITS: No acute abnormality. No orbital scalp soft tissue injury identified. SINUSES: Chronic right sphenoid sinus mucoperiosteal thickening. Other visible paranasal sinuses, mastoids, and milia are well aerated. SOFT TISSUES AND SKULL: Left vertex scalp sebaceous type cyst. No acute soft tissue abnormality. No skull fracture. Partially visible advanced cervical spine degeneration. IMPRESSION: 1. Advanced chronic cerebellar ischemia, And cannot exclude acute or subacute ischemia in the right cerebellum. 2. No other acute intracranial abnormality. No acute traumatic injury identified. Electronically signed by: Helayne Hurst MD 11/05/2024 11:11 AM EST RP Workstation: [id number]    Labs: Basic Metabolic Panel: Recent Labs  Lab 12/02/24 1057 12/02/24 1300 12/03/24 0620  NA 141  --  142  K 4.1  --  3.9  CL 104  --  109  CO2 25  --  23  GLUCOSE 104*  --  107*  BUN 12  --  10  CREATININE 0.69  --  0.65  CALCIUM  9.2  --  9.0  MG  --  2.1  --    CBC: Recent Labs  Lab 12/02/24 1057 12/03/24 0620  WBC 5.4 4.9  NEUTROABS 3.6  --   HGB 13.5 12.8  HCT 40.7 38.3  MCV 101.2* 100.0  PLT 186 166   Microbiology: Results for orders placed or performed during the hospital encounter of 05/06/23  Surgical pcr screen     Status: Abnormal   Collection Time: 05/06/23  1:30 PM   Specimen: Nasal Mucosa; Nasal Swab  Result Value Ref Range Status   MRSA, PCR NEGATIVE NEGATIVE Final   Staphylococcus aureus POSITIVE (A) NEGATIVE Final    Comment: (NOTE) The Xpert SA Assay (FDA approved for NASAL specimens in patients 19 years of age and older), is one component of a comprehensive surveillance program. It is not intended to diagnose infection nor to guide or monitor treatment. Performed at [facility], [address] [place], [place] [address]     Time coordinating discharge: Over 30 minutes  Marien LITTIE Piety, MD  [facility] 12/03/2024, 1:49 PM

## 2024-12-03 NOTE — Consult Note (Signed)
 Reason for Consult:CVA Requesting Physician: Lenon  CC: Right sided lip numbness  I have been asked by Dr. Lenon to see this patient in consultation for acute infarct.  HPI: Kristen Lloyd is an 83 y.o. 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  Symptoms have now resolved.  Patient not a thrombolytic or thrombectomy candidate at presentation due to nondisabling symptoms.    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   GERD (gastroesophageal reflux disease)    HTN (hypertension)    Hyperlipemia    Osteopenia    Palpitations    Primary osteoarthritis of left hip    Shingles    Spastic colon    Stroke (HCC) 11/05/2024   unsure how treated/which meds. seen here   TIA (transient ischemic attack)    TIA (transient ischemic attack) 06/03/2014    Past Surgical History:  Procedure Laterality Date   ABDOMINAL HYSTERECTOMY     APPENDECTOMY     BREAST BIOPSY Left 1990's   lt bx x 2-neg   CARDIAC CATHETERIZATION N/A 01/19/2017   Procedure: Left Heart Cath and Coronary Angiography;  Surgeon: Vinie DELENA Jude, MD;  Location: [facility] INVASIVE CV LAB;  Service: Cardiovascular;  Laterality: N/A;   CARDIAC CATHETERIZATION N/A 01/19/2017   Procedure: Coronary Stent Intervention;  Surgeon: Marsa Dooms, MD;  Location: [facility] INVASIVE CV LAB;  Service: Cardiovascular;  Laterality: N/A;   Ileostomy and reversal  N/A 2021   TONSILLECTOMY     TOTAL HIP ARTHROPLASTY Left 05/13/2023   Procedure: TOTAL HIP ARTHROPLASTY;  Surgeon: Mardee Lynwood SQUIBB, MD;  Location: [facility] ORS;  Service: Orthopedics;  Laterality: Left;    Family History  Problem Relation Age of Onset   Breast cancer Cousin 65       mat cousin    Social History:  reports that she has never smoked. She has never used smokeless tobacco. She reports current alcohol use. She reports that she does not use drugs.  Allergies  Allergen Reactions   Flagyl [Metronidazole]    Levofloxacin    Moxifloxacin    Septra [Sulfamethoxazole-Trimethoprim] Hives    Medications: I have reviewed the patient's current medications. Scheduled:  aspirin  EC  81 mg Oral Daily   heparin   5,000 Units Subcutaneous Q8H   rosuvastatin   20 mg Oral Daily   sodium chloride  flush  3 mL Intravenous Q12H    ROS: History obtained from the patient  General ROS: negative for - chills, fatigue, fever, night sweats, weight gain or weight loss Psychological ROS: negative for - behavioral disorder, hallucinations, memory difficulties, mood swings or suicidal ideation Ophthalmic ROS: negative for - blurry vision, double vision, eye pain or loss of vision ENT ROS: negative for - epistaxis, nasal discharge, oral lesions, sore throat, tinnitus or vertigo Allergy and Immunology ROS: negative for - hives or itchy/watery eyes Hematological and Lymphatic ROS: negative for -  bleeding problems, bruising or swollen lymph nodes Endocrine ROS: negative for - galactorrhea, hair pattern changes, polydipsia/polyuria or temperature intolerance Respiratory ROS: negative for - cough, hemoptysis, shortness of breath or wheezing Cardiovascular ROS: negative for - chest pain, dyspnea on exertion, edema or irregular heartbeat Gastrointestinal ROS: reflux symptoms Genito-Urinary ROS: negative for - dysuria, hematuria, incontinence or urinary frequency/urgency Musculoskeletal ROS: neck  pain Neurological ROS: as noted in HPI Dermatological ROS: negative for rash and skin lesion changes   Physical Examination: Blood pressure (!) 140/91, pulse 78, temperature 98.5 F (36.9 C), temperature source Oral, resp. rate 19, height 5' (1.524 m), weight 65.8 kg, SpO2 96%.  HEENT-  Normocephalic, no lesions, without obvious abnormality.  Normal external eye and conjunctiva.  Normal TM's bilaterally.  Normal auditory canals and external ears. Normal external nose, mucus membranes and septum.  Normal pharynx. Cardiovascular- Single S1, S2   Lungs- CTA Abdomen- Soft, nontender Extremities- no edema Musculoskeletal-arthritic changes noted Skin-warm and dry, no hyperpigmentation, vitiligo, or suspicious lesions  Neurological Examination   Mental Status: Alert, oriented, thought content appropriate.  Speech fluent without evidence of aphasia.  Able to follow 3 step commands without difficulty. Cranial Nerves: II: Visual fields grossly normal, pupils equal, round, reactive to light and accommodation III,IV, VI: ptosis not present, extra-ocular motions intact bilaterally V,VII: smile symmetric, facial light touch sensation normal bilaterally VIII: hearing normal bilaterally IX,X: gag reflex present XI: bilateral shoulder shrug XII: midline tongue extension Motor: Right : Upper extremity   5/5    Left:     Upper extremity   5/5  Lower extremity   5/5     Lower extremity   5/5 Tone and bulk:normal tone throughout; no atrophy noted Sensory: Pinprick and light touch intact throughout, bilaterally Deep Tendon Reflexes: 2+ and symmetric with absent AJ's bilaterally Plantars: Right: downgoing   Left: mute Cerebellar: normal finger-to-nose, normal rapid alternating movements and normal heel-to-shin testing bilaterally Gait: not tested due to safety concerns      Laboratory Studies:   Basic Metabolic Panel: Recent Labs  Lab 12/02/24 1057 12/02/24 1300 12/03/24 0620  NA 141  --   142  K 4.1  --  3.9  CL 104  --  109  CO2 25  --  23  GLUCOSE 104*  --  107*  BUN 12  --  10  CREATININE 0.69  --  0.65  CALCIUM  9.2  --  9.0  MG  --  2.1  --     Liver Function Tests: Recent Labs  Lab 12/02/24 1057  AST 20  ALT 11  ALKPHOS 113  BILITOT 0.6  PROT 6.7  ALBUMIN 4.2   No results for input(s): LIPASE, AMYLASE in the last 168 hours. No results for input(s): AMMONIA in the last 168 hours.  CBC: Recent Labs  Lab 12/02/24 1057 12/03/24 0620  WBC 5.4 4.9  NEUTROABS 3.6  --   HGB 13.5 12.8  HCT 40.7 38.3  MCV 101.2* 100.0  PLT 186 166    Cardiac Enzymes: No results for input(s): CKTOTAL, CKMB, CKMBINDEX, TROPONINI in the last 168 hours.  BNP: Invalid input(s): POCBNP  CBG: No results for input(s): GLUCAP in the last 168 hours.  Microbiology: Results for orders placed or performed during the hospital encounter of 05/06/23  Surgical pcr screen     Status: Abnormal   Collection Time: 05/06/23  1:30 PM   Specimen: Nasal Mucosa; Nasal Swab  Result Value Ref Range Status   MRSA, PCR NEGATIVE  NEGATIVE Final   Staphylococcus aureus POSITIVE (A) NEGATIVE Final    Comment: (NOTE) The Xpert SA Assay (FDA approved for NASAL specimens in patients 70 years of age and older), is one component of a comprehensive surveillance program. It is not intended to diagnose infection nor to guide or monitor treatment. Performed at [facility], [address] [place], [place] [address]     Coagulation Studies: Recent Labs    12/02/24 1057  LABPROT 12.7  INR 0.9    Urinalysis: No results for input(s): COLORURINE, LABSPEC, PHURINE, GLUCOSEU, HGBUR, BILIRUBINUR, KETONESUR, PROTEINUR, UROBILINOGEN, NITRITE, LEUKOCYTESUR in the last 168 hours.  Invalid input(s): APPERANCEUR  Lipid Panel:     Component Value Date/Time   CHOL 240 (H) 12/02/2024 1430   TRIG 97 12/02/2024 1430   HDL 69 12/02/2024 1430    CHOLHDL 3.5 12/02/2024 1430   VLDL 19 12/02/2024 1430   LDLCALC 152 (H) 12/02/2024 1430    HgbA1C:  Lab Results  Component Value Date   HGBA1C 5.3 11/05/2024    Urine Drug Screen:      Component Value Date/Time   LABOPIA NONE DETECTED 11/05/2024 1021   COCAINSCRNUR NONE DETECTED 11/05/2024 1021   LABBENZ NONE DETECTED 11/05/2024 1021   AMPHETMU NONE DETECTED 11/05/2024 1021   THCU NONE DETECTED 11/05/2024 1021   LABBARB NONE DETECTED 11/05/2024 1021    Alcohol Level:  Recent Labs  Lab 12/02/24 1057  ETH <15    Other results: EKG: NSR with sinus arrhythmia at 80 bpm.  Imaging: US  Venous Img Lower Bilateral (DVT) Result Date: 12/02/2024 EXAM: ULTRASOUND DUPLEX OF THE BILATERAL LOWER EXTREMITY VEINS TECHNIQUE: Duplex ultrasound using B-mode/gray scaled imaging and Doppler spectral analysis and color flow was obtained of the deep venous structures of the bilateral lower extremity. COMPARISON: None available. CLINICAL HISTORY: [id number] Cerebral infarction [facility]) [id number] Cerebral infarction [facility]) FINDINGS: LEFT: The common femoral vein, femoral vein, popliteal vein, and posterior tibial vein of the left lower extremity demonstrate normal compressibility with normal color flow and spectral analysis. RIGHT: The common femoral vein, femoral vein, popliteal vein, and posterior tibial vein of the right lower extremity demonstrate normal compressibility with normal color flow and spectral analysis. IMPRESSION: 1. No evidence of deep venous thrombosis in either lower extremity. Electronically signed by: Franky Crease MD 12/02/2024 07:14 PM EST RP Workstation: [id number]   MR BRAIN WO CONTRAST Result Date: 12/02/2024 EXAM: MRI Brain Without Contrast 12/02/2024 04:10:37 PM TECHNIQUE: Multiplanar multisequence MRI of the head/brain was performed without the administration of intravenous contrast. COMPARISON: None available. CLINICAL HISTORY: Stroke, follow up Stroke, follow up FINDINGS: BRAIN AND  VENTRICLES: New/interval punctate acute infarct in the left frontal lobe (series 5 and 6, image 38). Subacute evolving right periatrial temporal lobe infarct. No acute intracranial hemorrhage. No mass. No midline shift. No hydrocephalus. Susceptibility along the sulci in the right occipital lobe is suggestive of prior subarachnoid hemorrhage. Remote lacunar infarcts the left thalamus, left corona radiata and cerebellum. Moderate supratentorial white matter signal abnormalities, compatible with chronic microvascular ischemic changes. Normal flow voids. ORBITS: No acute abnormality. SINUSES AND MASTOIDS: No acute abnormality. BONES AND SOFT TISSUES: Normal marrow signal. No acute soft tissue abnormality. IMPRESSION: 1. New/interval punctate acute infarct in the left frontal lobe. 2. Subacute evolving right periatrial temporal lobe infarct. 3. Many remote lacunar infarcts, as above. Electronically signed by: Gilmore Molt MD 12/02/2024 04:25 PM EST RP Workstation: [id number]   DG Chest 2 View Result Date: 12/02/2024 EXAM: 2 VIEW(S) XRAY OF THE  CHEST 12/02/2024 12:52:00 PM COMPARISON: 11/05/2024 CLINICAL HISTORY: sob sob FINDINGS: LUNGS AND PLEURA: No focal pulmonary opacity. No pleural effusion. No pneumothorax. HEART AND MEDIASTINUM: No acute abnormality of the cardiac and mediastinal silhouettes. BONES AND SOFT TISSUES: No acute osseous abnormality. IMPRESSION: 1. No acute cardiopulmonary process. Electronically signed by: Lynwood Seip MD 12/02/2024 01:08 PM EST RP Workstation: [id number]   CT HEAD WO CONTRAST Result Date: 12/02/2024 EXAM: CT HEAD WITHOUT CONTRAST 12/02/2024 12:06:37 PM TECHNIQUE: CT of the head was performed without the administration of intravenous contrast. Automated exposure control, iterative reconstruction, and/or weight based adjustment of the mA/kV was utilized to reduce the radiation dose to as low as reasonably achievable. COMPARISON: 11/05/2024 CLINICAL HISTORY: Transient ischemic  attack (TIA) FINDINGS: BRAIN AND VENTRICLES: No acute hemorrhage. No evidence of acute infarct. No hydrocephalus. No extra-axial collection. No mass effect or midline shift. Stable patchy hypodensity in supratentorial white matter, likely reflecting sequela of underlying chronic small-vessel ischemic change. Unchanged remote infarcts in right cerebellum. Calcification of bilateral carotid siphons and vertebral arteries. ORBITS: No acute abnormality. SINUSES: Mild mucosal thickening in right sphenoid sinus. SOFT TISSUES AND SKULL: No acute soft tissue abnormality. No skull fracture. IMPRESSION: 1. No acute intracranial abnormality. 2. Stable patchy hypodensity in supratentorial white matter, likely reflecting sequela of underlying chronic small-vessel ischemic change. 3. Unchanged remote infarcts in right cerebellum. Electronically signed by: Evalene Coho MD 12/02/2024 12:19 PM EST RP Workstation: [id number]     Assessment/Plan: 83 y.o. 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  Symptoms have now resolved.  Patient not a thrombolytic or thrombectomy candidate at presentation due to nondisabling symptoms.  MRI of the brain personally reviewed and reveals an acute left frontal infarct.  Infarct small and does suggest a cardioembolic etiology particularly in the setting of the right sided infarct less than one month ago.  Patient currently on ASA and statin.  Work up on last admission included an echocardiogram showing an EF of 55-60% with no cardiac source of emboli noted, CTA was remarkable for severe right vertebral stenosis, LDL elevated at 152.     BLE dopplers performed on this admission were unremarkable.   Recommendations Continue Crestor .  Goal LDL<70 Would change patient to Plavix  75mg  daily.  Patient has received ASA today.   Would discontinue after today and start Plavix  on 12/9.  Patient not recommended dual antiplatelet therapy due to evidence of possible SAH in the past on imaging   Patient had cardiac monitoring after last hospitalization.  Unclear if results were able to be obtained.  Would consider LINQ monitoring.  Cardiology to be involved.  Patient scheduled for neurology f/u   Case discussed with Dr. Lenon.    Sonny Hock, MD Neurology  12/03/2024, 8:59 AM

## 2024-12-03 NOTE — Evaluation (Signed)
 Physical Therapy Evaluation Patient Details Name: Kristen Lloyd MRN: [id number] DOB: 1941-08-13 Today's Date: 12/03/2024  History of Present Illness  Pt is a 83 y.o. year old female presenting to the ED with facial tingling. 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 PMH of hypertension, uncontrolled hyperlipidemia, previous stroke and TIA 1 month ago.  Clinical Impression  Patient seated indep edge of bed upon arrival to room; husband present at bedside. Patient alert and oriented, follows commands and agreeable to participation with session.  Denies pain, but does endorse generalized weakness throughout.  Initial symptoms (lip/face tingling) have fully resolved per patient report. Bilat UE/LE grossly symmetrical and WFL; no focal weakness, sensory or coordination deficit appreciated.  Able to complete sit/stand, basic transfers and gait (200') with RW, close sup.  Demonstrates reciprocal stepping pattern with fair step height/length, fair trunk rotation; decreased cadence and overall gait speed, decreased gait confidence. Does prefer (and do recommend) continued use of RW for all gait efforts at this point  High-level balance deficits evident; mild veering with dynamic gait components, BERG 36/56.  Patient with fair/good awareness and fair/good use of compensatory strategies as needed. Would benefit from skilled PT to address above deficits and promote optimal return to PLOF.; recommend post-acute PT follow up as indicated by interdisciplinary care team.          If plan is discharge home, recommend the following: A little help with walking and/or transfers;A little help with bathing/dressing/bathroom   Can travel by private vehicle        Equipment Recommendations    Recommendations for Other Services       Functional Status Assessment Patient has had a recent decline in their functional status and demonstrates the ability to make significant improvements in  function in a reasonable and predictable amount of time.     Precautions / Restrictions Precautions Precautions: Fall Restrictions Weight Bearing Restrictions Per Provider Order: No      Mobility  Bed Mobility Overal bed mobility: Modified Independent             General bed mobility comments: seated edge of bed beginning/end of treatment session    Transfers Overall transfer level: Needs assistance Equipment used: Rolling walker (2 wheels) Transfers: Sit to/from Stand Sit to Stand: Supervision                Ambulation/Gait Ambulation/Gait assistance: Supervision Gait Distance (Feet): 200 Feet Assistive device: Rolling walker (2 wheels)         General Gait Details: reciprocal stepping pattern with fair step height/length, fair trunk rotation; decreased cadence and overall gait speed, decreased gait confidence.  Does prefer (and do recommend) continued use of RW for all gait efforts at this point  Stairs            Wheelchair Mobility     Tilt Bed    Modified Rankin (Stroke Patients Only)       Balance Overall balance assessment: Needs assistance Sitting-balance support: No upper extremity supported, Feet supported Sitting balance-Leahy Scale: Good     Standing balance support: Bilateral upper extremity supported Standing balance-Leahy Scale: Fair                   Standardized Balance Assessment Standardized Balance Assessment : Berg Balance Test Berg Balance Test Sit to Stand: Able to stand  independently using hands Standing Unsupported: Able to stand safely 2 minutes Sitting with Back Unsupported but Feet Supported on Floor or Stool:  Able to sit safely and securely 2 minutes Stand to Sit: Controls descent by using hands Transfers: Able to transfer safely, definite need of hands Standing Unsupported with Eyes Closed: Able to stand 10 seconds with supervision Standing Ubsupported with Feet Together: Able to place feet together  independently and stand for 1 minute with supervision From Standing, Reach Forward with Outstretched Arm: Can reach forward >12 cm safely (5) From Standing Position, Pick up Object from Floor: Able to pick up shoe, needs supervision From Standing Position, Turn to Look Behind Over each Shoulder: Turn sideways only but maintains balance Turn 360 Degrees: Able to turn 360 degrees safely but slowly Standing Unsupported, Alternately Place Feet on Step/Stool: Able to complete >2 steps/needs minimal assist Standing Unsupported, One Foot in Front: Needs help to step but can hold 15 seconds Standing on One Leg: Tries to lift leg/unable to hold 3 seconds but remains standing independently Total Score: 36         Pertinent Vitals/Pain Pain Assessment Pain Assessment: No/denies pain    Home Living Family/patient expects to be discharged to:: Private residence Living Arrangements: Spouse/significant other Available Help at Discharge: Family Type of Home: House Home Access: Stairs to enter Entrance Stairs-Rails: Can reach both Entrance Stairs-Number of Steps: 3   Home Layout: One level Home Equipment: Cane - single point Additional Comments: spouse is 58    Prior Function Prior Level of Function : Independent/Modified Independent             Mobility Comments: At true baseline, indep with mobility; recent use of [facility] for household and community mobilization due to generalized weakness since previous CVA.  Denies additional fall history aside from previous CVA episode. Husband does all of the driving. ADLs Comments: has not been driving recently, IND with ADLs/IADLs, reports she can start ordering meals     Extremity/Trunk Assessment   Upper Extremity Assessment Upper Extremity Assessment: Overall WFL for tasks assessed    Lower Extremity Assessment Lower Extremity Assessment: Overall WFL for tasks assessed (grossly at least 4+/5 throughout; no focal weakness, sensory and  coordination deficits)       Communication   Communication Communication: No apparent difficulties    Cognition Arousal: Alert Behavior During Therapy: WFL for tasks assessed/performed   PT - Cognitive impairments: No apparent impairments                         Following commands: Intact       Cueing Cueing Techniques: Verbal cues     General Comments General comments (skin integrity, edema, etc.): reports feeling very mild lightheadedness and heaviness to her posterior neck/head    Exercises     Assessment/Plan    PT Assessment Patient needs continued PT services  PT Problem List Decreased strength;Decreased range of motion;Decreased activity tolerance;Decreased balance;Decreased mobility;Decreased coordination;Decreased cognition;Decreased knowledge of use of DME;Decreased safety awareness;Decreased knowledge of precautions       PT Treatment Interventions DME instruction;Gait training;Stair training;Functional mobility training;Therapeutic activities;Therapeutic exercise;Balance training;Neuromuscular re-education;Cognitive remediation;Patient/family education    PT Goals (Current goals can be found in the Care Plan section)  Acute Rehab PT Goals Patient Stated Goal: to return home PT Goal Formulation: With patient/family Time For Goal Achievement: 12/17/24 Potential to Achieve Goals: Good    Frequency Min 2X/week     Co-evaluation               AM-PAC PT 6 Clicks Mobility  Outcome Measure Help needed turning from  your back to your side while in a flat bed without using bedrails?: None Help needed moving from lying on your back to sitting on the side of a flat bed without using bedrails?: None Help needed moving to and from a bed to a chair (including a wheelchair)?: None Help needed standing up from a chair using your arms (e.g., wheelchair or bedside chair)?: None Help needed to walk in hospital room?: A Little Help needed climbing 3-5  steps with a railing? : A Little 6 Click Score: 22    End of Session   Activity Tolerance: Patient tolerated treatment well Patient left: in bed;with call bell/phone within reach   PT Visit Diagnosis: Muscle weakness (generalized) (M62.81);Difficulty in walking, not elsewhere classified (R26.2)    Time: 8985-8956 PT Time Calculation (min) (ACUTE ONLY): 29 min   Charges:   PT Evaluation $PT Eval Low Complexity: 1 Low   PT General Charges $$ ACUTE PT VISIT: 1 Visit        Blayde Bacigalupi H. Delores, PT, DPT, NCS 12/03/24, 11:24 AM [phone]

## 2024-12-03 NOTE — Care Management Obs Status (Signed)
 MEDICARE OBSERVATION STATUS NOTIFICATION   Patient Details  Name: Kristen Lloyd MRN: [id number] Date of Birth: August 04, 1941   Medicare Observation Status Notification Given:  Chaney BRANDY CHRISTIANE LELON, CMA 12/03/2024, 3:04 PM

## 2024-12-03 NOTE — Discharge Instructions (Addendum)
 Please take plavix  every day.  Also take your crestor  at 20mg  daily and if you are doing well on this dose, your PCP can discuss increasing the dose if needed at your follow up appointment next week.  Continue your heart monitor. This will be reviewed by cardiology.   We have ordered home health PT and OT to work with you at home to increase your strength

## 2024-12-03 NOTE — Plan of Care (Signed)

## 2024-12-03 NOTE — Progress Notes (Incomplete)
 PROGRESS NOTE Kristen Lloyd    DOB: 06-23-1941, 83 y.o.  FMW:[id number]    Code Status: Full Code   DOA: 12/02/2024   LOS: 0  Brief hospital course  Kristen Lloyd is a 83 y.o. female with a PMH significant for ***  They presented from *** to the ED on 12/02/2024 with *** x *** days. ***  In the ED, it was found that they had ***.  Significant findings included ***.  They were initially treated with ***.   Patient was admitted to medicine service for further workup and management of *** as outlined in detail below.  12/03/24 -***  Assessment & Plan  Principal Problem:   Cerebral infarction (HCC) Active Problems:   CAD (coronary artery disease)   HTN (hypertension)   Hyperlipidemia  *** -   *** -   *** -   *** -   *** -   Body mass index is 28.33 kg/m.  VTE ppx: heparin  injection 5,000 Units Start: 12/02/24 2200   Diet:     Diet   Diet NPO time specified   Consultants: ***  Subjective 12/03/24    Pt reports ***   Objective  Blood pressure (!) 144/84, pulse 73, temperature 97.8 F (36.6 C), temperature source Oral, resp. rate 19, height 5' (1.524 m), weight 65.8 kg, SpO2 95%. No intake or output data in the 24 hours ending 12/03/24 0727 Filed Weights   12/02/24 1055 12/03/24 0454  Weight: 63.7 kg 65.8 kg     Physical Exam: *** General: awake, alert, NAD HEENT: atraumatic, clear conjunctiva, anicteric sclera, MMM, hearing grossly normal Respiratory: normal respiratory effort. Cardiovascular: extremities well perfused, quick capillary refill, normal S1/S2, RRR, no JVD, murmurs Gastrointestinal: soft, NT, ND Nervous: A&O x3. no gross focal neurologic deficits, normal speech Extremities: moves all equally, no edema, normal tone Skin: dry, intact, normal temperature, normal color. No rashes, lesions or ulcers on exposed skin Psychiatry: normal mood, congruent affect  Labs   I have personally reviewed the following labs and imaging  studies CBC    Component Value Date/Time   WBC 4.9 12/03/2024 0620   RBC 3.83 (L) 12/03/2024 0620   HGB 12.8 12/03/2024 0620   HGB 14.3 04/23/2014 1136   HCT 38.3 12/03/2024 0620   HCT 42.8 04/23/2014 1136   PLT 166 12/03/2024 0620   PLT 195 04/23/2014 1136   MCV 100.0 12/03/2024 0620   MCV 97 04/23/2014 1136   MCH 33.4 12/03/2024 0620   MCHC 33.4 12/03/2024 0620   RDW 14.4 12/03/2024 0620   RDW 14.4 04/23/2014 1136   LYMPHSABS 1.4 12/02/2024 1057   MONOABS 0.4 12/02/2024 1057   EOSABS 0.0 12/02/2024 1057   BASOSABS 0.0 12/02/2024 1057      Latest Ref Rng & Units 12/03/2024    6:20 AM 12/02/2024   10:57 AM 11/05/2024   10:15 PM  BMP  Glucose 70 - 99 mg/dL 892  895    BUN 8 - 23 mg/dL 10  12    Creatinine 9.55 - 1.00 mg/dL 9.34  9.30  9.28   Sodium 135 - 145 mmol/L 142  141    Potassium 3.5 - 5.1 mmol/L 3.9  4.1    Chloride 98 - 111 mmol/L 109  104    CO2 22 - 32 mmol/L 23  25    Calcium  8.9 - 10.3 mg/dL 9.0  9.2      US  Venous Img Lower Bilateral (DVT) Result Date: 12/02/2024 EXAM: ULTRASOUND DUPLEX  OF THE BILATERAL LOWER EXTREMITY VEINS TECHNIQUE: Duplex ultrasound using B-mode/gray scaled imaging and Doppler spectral analysis and color flow was obtained of the deep venous structures of the bilateral lower extremity. COMPARISON: None available. CLINICAL HISTORY: [id number] Cerebral infarction [facility]) [id number] Cerebral infarction [facility]) FINDINGS: LEFT: The common femoral vein, femoral vein, popliteal vein, and posterior tibial vein of the left lower extremity demonstrate normal compressibility with normal color flow and spectral analysis. RIGHT: The common femoral vein, femoral vein, popliteal vein, and posterior tibial vein of the right lower extremity demonstrate normal compressibility with normal color flow and spectral analysis. IMPRESSION: 1. No evidence of deep venous thrombosis in either lower extremity. Electronically signed by: Franky Crease MD 12/02/2024 07:14 PM EST RP  Workstation: [id number]   MR BRAIN WO CONTRAST Result Date: 12/02/2024 EXAM: MRI Brain Without Contrast 12/02/2024 04:10:37 PM TECHNIQUE: Multiplanar multisequence MRI of the head/brain was performed without the administration of intravenous contrast. COMPARISON: None available. CLINICAL HISTORY: Stroke, follow up Stroke, follow up FINDINGS: BRAIN AND VENTRICLES: New/interval punctate acute infarct in the left frontal lobe (series 5 and 6, image 38). Subacute evolving right periatrial temporal lobe infarct. No acute intracranial hemorrhage. No mass. No midline shift. No hydrocephalus. Susceptibility along the sulci in the right occipital lobe is suggestive of prior subarachnoid hemorrhage. Remote lacunar infarcts the left thalamus, left corona radiata and cerebellum. Moderate supratentorial white matter signal abnormalities, compatible with chronic microvascular ischemic changes. Normal flow voids. ORBITS: No acute abnormality. SINUSES AND MASTOIDS: No acute abnormality. BONES AND SOFT TISSUES: Normal marrow signal. No acute soft tissue abnormality. IMPRESSION: 1. New/interval punctate acute infarct in the left frontal lobe. 2. Subacute evolving right periatrial temporal lobe infarct. 3. Many remote lacunar infarcts, as above. Electronically signed by: Gilmore Molt MD 12/02/2024 04:25 PM EST RP Workstation: [id number]   DG Chest 2 View Result Date: 12/02/2024 EXAM: 2 VIEW(S) XRAY OF THE CHEST 12/02/2024 12:52:00 PM COMPARISON: 11/05/2024 CLINICAL HISTORY: sob sob FINDINGS: LUNGS AND PLEURA: No focal pulmonary opacity. No pleural effusion. No pneumothorax. HEART AND MEDIASTINUM: No acute abnormality of the cardiac and mediastinal silhouettes. BONES AND SOFT TISSUES: No acute osseous abnormality. IMPRESSION: 1. No acute cardiopulmonary process. Electronically signed by: Lynwood Seip MD 12/02/2024 01:08 PM EST RP Workstation: [id number]   CT HEAD WO CONTRAST Result Date: 12/02/2024 EXAM: CT HEAD WITHOUT  CONTRAST 12/02/2024 12:06:37 PM TECHNIQUE: CT of the head was performed without the administration of intravenous contrast. Automated exposure control, iterative reconstruction, and/or weight based adjustment of the mA/kV was utilized to reduce the radiation dose to as low as reasonably achievable. COMPARISON: 11/05/2024 CLINICAL HISTORY: Transient ischemic attack (TIA) FINDINGS: BRAIN AND VENTRICLES: No acute hemorrhage. No evidence of acute infarct. No hydrocephalus. No extra-axial collection. No mass effect or midline shift. Stable patchy hypodensity in supratentorial white matter, likely reflecting sequela of underlying chronic small-vessel ischemic change. Unchanged remote infarcts in right cerebellum. Calcification of bilateral carotid siphons and vertebral arteries. ORBITS: No acute abnormality. SINUSES: Mild mucosal thickening in right sphenoid sinus. SOFT TISSUES AND SKULL: No acute soft tissue abnormality. No skull fracture. IMPRESSION: 1. No acute intracranial abnormality. 2. Stable patchy hypodensity in supratentorial white matter, likely reflecting sequela of underlying chronic small-vessel ischemic change. 3. Unchanged remote infarcts in right cerebellum. Electronically signed by: Evalene Coho MD 12/02/2024 12:19 PM EST RP Workstation: [id number]    Disposition Plan & Communication  Patient status: Observation  Admitted From: {From:23814} Planned disposition location: {PLAN; DISPOSITION:26386} Anticipated discharge date: *** pending ***  Family Communication: ***  Author: Marien LITTIE Piety, DO [facility] 12/03/2024, 7:27 AM   Available by Epic secure chat 7AM-7PM. If 7PM-7AM, please contact night-coverage.  [facility] contact information found on [email or web address].

## 2024-12-03 NOTE — TOC Initial Note (Addendum)
 Transition of Care [facility]) - Initial/Assessment Note    Patient Details  Name: Kristen Lloyd MRN: [id number] Date of Birth: 06/28/1941  Transition of Care [facility]) CM/SW Contact:    Dalia GORMAN Fuse, RN Phone Number: 12/03/2024, 1:18 PM  Clinical Narrative:                 TOC met with the patient and her husband in the room. The patient is from home independently. Her husband does the driving to and from appts. Therapy recs are for [facility] PT/OT. The patient used [facility] in the past and would like to use them again. TOC sent referral to [facility] in the hub.  1556:[facility] accepted in the hub.  Expected Discharge Plan: Skilled Nursing Facility Barriers to Discharge: Continued Medical Work up   Patient Goals and CMS Choice     Choice offered to / list presented to : Patient      Expected Discharge Plan and Services     Post Acute Care Choice: Home Health Living arrangements for the past 2 months: Single Family Home                                      Prior Living Arrangements/Services Living arrangements for the past 2 months: Single Family Home Lives with:: Spouse                   Activities of Daily Living   ADL Screening (condition at time of admission) Independently performs ADLs?: Yes (appropriate for developmental age)  Permission Sought/Granted                  Emotional Assessment              Admission diagnosis:  Tingling [R20.2] Cerebral infarction [facility]) [I63.9] Acute ischemic stroke [facility]) [I63.9] Patient Active Problem List   Diagnosis Date Noted   Cerebral infarction (HCC) 12/02/2024   Iron deficiency anemia 08/04/2023   Osteoarthritis 05/13/2023   Hx of total hip arthroplasty, 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   Rotator cuff tendinitis, right 04/21/2022   Nontraumatic incomplete tear of left rotator cuff 03/26/2022   Primary osteoarthritis of left shoulder 03/26/2022   Rotator  cuff tendinitis, left 03/26/2022   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   Diverticulitis of large intestine with abscess without bleeding 12/25/2019   Unstable angina (HCC) 01/19/2017   CAD (coronary artery disease) 01/19/2017   Globus sensation 03/08/2016   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   HTN (hypertension) 06/03/2014   Hyperlipidemia 06/03/2014   Palpitation 06/03/2014   Presence of coronary angioplasty implant and graft 12/27/2009   PCP:  Epifanio Alm SQUIBB, MD Pharmacy:   [id number],  - [address] AT [facility] [address] [facility] [place] [address] Phone: [phone] Fax: [phone]     Social Drivers of Health (SDOH) Social History: SDOH Screenings   Food Insecurity: No Food Insecurity (12/03/2024)  Housing: Low Risk  (12/03/2024)  Transportation Needs: Unknown (12/03/2024)  Utilities: Not At Risk (11/05/2024)  Depression (PHQ2-9): Low Risk  (08/03/2023)  Financial Resource Strain: Low Risk  (07/26/2024)   Received from [facility]  Social Connections: Unknown (12/03/2024)  Tobacco Use: Low Risk  (12/02/2024)   SDOH Interventions:     Readmission Risk Interventions     No data to display

## 2024-12-03 NOTE — Evaluation (Signed)
 Occupational Therapy Evaluation Patient Details Name: Kristen Lloyd MRN: [id number] DOB: 16-Feb-1941 Today's Date: 12/03/2024   History of Present Illness   Pt is a 83 y.o. year old female presenting to the ED with facial tingling. 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 PMH of hypertension, uncontrolled hyperlipidemia, previous stroke and TIA 1 month ago.     Clinical Impressions Pt was seen for OT evaluation this date. PTA, pt resides at home with her spouse where she is typically IND with all tasks, recently stopped driving. Typically does not utilize AD to ambulate household distances and uses [facility] for community mobility. Reports recently began using SPC in the home. Pt presents with deficits in overall strength and endurance, affecting safe and optimal ADL completion. Vision, sensation and coordination intact. Pt currently requires MOD I for bed mobility and supervision for STS from EOB. Independent with UB dressing tasks seated at EOB. No focal weakness noted, BUE strength 4-/5. Ambulated ~80-90 feet using RW with SBA d/t mild lightheadedness and posterior neck/head heaviness. Nurse notified. Will follow acutely and recommend use of RW for safe return home with follow up therapy services at home.      If plan is discharge home, recommend the following:   A little help with walking and/or transfers;A little help with bathing/dressing/bathroom;Assistance with cooking/housework;Assist for transportation;Help with stairs or ramp for entrance     Functional Status Assessment   Patient has had a recent decline in their functional status and demonstrates the ability to make significant improvements in function in a reasonable and predictable amount of time.     Equipment Recommendations   None recommended by OT     Recommendations for Other Services         Precautions/Restrictions   Precautions Precautions: Fall Recall of  Precautions/Restrictions: Intact Restrictions Weight Bearing Restrictions Per Provider Order: No     Mobility Bed Mobility Overal bed mobility: Modified Independent                  Transfers Overall transfer level: Needs assistance Equipment used: Rolling walker (2 wheels) Transfers: Sit to/from Stand Sit to Stand: Supervision           General transfer comment: stood from EOB without difficulty, ambulated ~80-90 ft using RW with SBA      Balance Overall balance assessment: Needs assistance Sitting-balance support: Feet supported Sitting balance-Leahy Scale: Normal     Standing balance support: During functional activity, Bilateral upper extremity supported Standing balance-Leahy Scale: Good Standing balance comment: RW                           ADL either performed or assessed with clinical judgement   ADL Overall ADL's : Needs assistance/impaired                 Upper Body Dressing : Sitting;Independent Upper Body Dressing Details (indicate cue type and reason): able to doff sweatshirt and donn gown                 Functional mobility during ADLs: Contact guard assist;Rolling walker (2 wheels);Supervision/safety General ADL Comments: SBA with RW to ambulate ~80-90 ft     Vision Baseline Vision/History: 1 Wears glasses Patient Visual Report: No change from baseline       Perception         Praxis         Pertinent Vitals/Pain Pain Assessment Pain Assessment: 0-10  Pain Score: 2  Pain Location: back of her head/neck Pain Descriptors / Indicators: Heaviness Pain Intervention(s): Monitored during session, Repositioned     Extremity/Trunk Assessment Upper Extremity Assessment Upper Extremity Assessment: Overall WFL for tasks assessed   Lower Extremity Assessment Lower Extremity Assessment: Overall WFL for tasks assessed       Communication Communication Communication: No apparent difficulties   Cognition  Arousal: Alert Behavior During Therapy: WFL for tasks assessed/performed Cognition: No apparent impairments                               Following commands: Intact       Cueing  General Comments   Cueing Techniques: Verbal cues  reports feeling very mild lightheadedness and heaviness to her posterior neck/head   Exercises Other Exercises Other Exercises: Edu on role of OT in acute setting and DC recommendations.   Shoulder Instructions      Home Living Family/patient expects to be discharged to:: Private residence Living Arrangements: Spouse/significant other Available Help at Discharge: Family Type of Home: House Home Access: Stairs to enter Secretary/administrator of Steps: 3 Entrance Stairs-Rails: Can reach both Home Layout: One level               Home Equipment: Cane - single point   Additional Comments: spouse is 23      Prior Functioning/Environment Prior Level of Function : Independent/Modified Independent             Mobility Comments: using cane in the community, reports has started using it in the home too ADLs Comments: has not been driving recently, IND with ADLs/IADLs, reports she can start ordering meals    OT Problem List: Decreased strength;Decreased activity tolerance   OT Treatment/Interventions: Self-care/ADL training;Therapeutic exercise;Balance training;Therapeutic activities;Patient/family education      OT Goals(Current goals can be found in the care plan section)   Acute Rehab OT Goals Patient Stated Goal: go home OT Goal Formulation: With patient Time For Goal Achievement: 12/17/24 Potential to Achieve Goals: Good ADL Goals Pt Will Transfer to Toilet: with modified independence;regular height toilet;ambulating Pt/caregiver will Perform Home Exercise Program: Increased strength;Both right and left upper extremity;With theraband;Independently;With written HEP provided   OT Frequency:  Min 1X/week     Co-evaluation              AM-PAC OT 6 Clicks Daily Activity     Outcome Measure Help from another person eating meals?: None Help from another person taking care of personal grooming?: None Help from another person toileting, which includes using toliet, bedpan, or urinal?: A Little Help from another person bathing (including washing, rinsing, drying)?: A Little Help from another person to put on and taking off regular upper body clothing?: None Help from another person to put on and taking off regular lower body clothing?: A Little 6 Click Score: 21   End of Session Equipment Utilized During Treatment: Rolling walker (2 wheels) Nurse Communication: Mobility status  Activity Tolerance: Patient tolerated treatment well Patient left: in bed;with call bell/phone within reach;with nursing/sitter in room  OT Visit Diagnosis: Other abnormalities of gait and mobility (R26.89);Muscle weakness (generalized) (M62.81)                Time: 9186-9167 OT Time Calculation (min): 19 min Charges:  OT General Charges $OT Visit: 1 Visit OT Evaluation $OT Eval Low Complexity: 1 Low Lamia Mariner Chrismon, OTR/L 12/03/24, 9:54 AM  Hamp Moreland E Chrismon 12/03/2024,  9:51 AM

## 2025-01-02 ENCOUNTER — Ambulatory Visit: Admit: 2025-01-02 | Admitting: Ophthalmology

## 2025-01-02 SURGERY — PHACOEMULSIFICATION, CATARACT, WITH IOL INSERTION
Anesthesia: Topical | Laterality: Left

## 2025-01-16 ENCOUNTER — Ambulatory Visit: Admit: 2025-01-16 | Admitting: Ophthalmology

## 2025-01-16 SURGERY — PHACOEMULSIFICATION, CATARACT, WITH IOL INSERTION
Anesthesia: Topical | Laterality: Right

## 2025-02-21 ENCOUNTER — Ambulatory Visit: Admit: 2025-02-21 | Admitting: Cardiovascular Disease
